# Patient Record
Sex: Male | Born: 1945 | Race: White | Hispanic: No | Marital: Married | Smoking: Never smoker
Health system: Southern US, Community
[De-identification: ages and names within clinical notes are randomized; demographics above are authoritative.]

## PROBLEM LIST (undated history)

## (undated) DIAGNOSIS — I10 Essential (primary) hypertension: Secondary | ICD-10-CM

## (undated) DIAGNOSIS — I25810 Atherosclerosis of coronary artery bypass graft(s) without angina pectoris: Principal | ICD-10-CM

## (undated) DIAGNOSIS — Z87442 Personal history of urinary calculi: Secondary | ICD-10-CM

## (undated) DIAGNOSIS — I739 Peripheral vascular disease, unspecified: Secondary | ICD-10-CM

## (undated) DIAGNOSIS — M1990 Unspecified osteoarthritis, unspecified site: Secondary | ICD-10-CM

## (undated) DIAGNOSIS — E785 Hyperlipidemia, unspecified: Secondary | ICD-10-CM

## (undated) DIAGNOSIS — I214 Non-ST elevation (NSTEMI) myocardial infarction: Secondary | ICD-10-CM

## (undated) DIAGNOSIS — J42 Unspecified chronic bronchitis: Secondary | ICD-10-CM

## (undated) DIAGNOSIS — I472 Ventricular tachycardia: Secondary | ICD-10-CM

## (undated) HISTORY — PX: FRACTURE SURGERY: SHX138

## (undated) HISTORY — DX: Atherosclerosis of coronary artery bypass graft(s) without angina pectoris: I25.810

## (undated) HISTORY — DX: Peripheral vascular disease, unspecified: I73.9

---

## 1971-03-15 HISTORY — PX: MANDIBLE SURGERY: SHX707

## 2001-12-06 ENCOUNTER — Emergency Department (HOSPITAL_COMMUNITY): Admission: EM | Admit: 2001-12-06 | Discharge: 2001-12-07 | Payer: Self-pay | Admitting: Emergency Medicine

## 2001-12-07 ENCOUNTER — Encounter: Payer: Self-pay | Admitting: Emergency Medicine

## 2001-12-10 ENCOUNTER — Encounter: Payer: Self-pay | Admitting: Emergency Medicine

## 2001-12-10 ENCOUNTER — Emergency Department (HOSPITAL_COMMUNITY): Admission: EM | Admit: 2001-12-10 | Discharge: 2001-12-10 | Payer: Self-pay | Admitting: Emergency Medicine

## 2005-11-15 ENCOUNTER — Emergency Department (HOSPITAL_COMMUNITY): Admission: EM | Admit: 2005-11-15 | Discharge: 2005-11-15 | Payer: Self-pay | Admitting: Emergency Medicine

## 2007-05-31 ENCOUNTER — Emergency Department (HOSPITAL_COMMUNITY): Admission: EM | Admit: 2007-05-31 | Discharge: 2007-05-31 | Payer: Self-pay | Admitting: Emergency Medicine

## 2007-06-16 ENCOUNTER — Emergency Department (HOSPITAL_COMMUNITY): Admission: EM | Admit: 2007-06-16 | Discharge: 2007-06-16 | Payer: Self-pay | Admitting: Emergency Medicine

## 2010-12-06 LAB — STREP A DNA PROBE: Group A Strep Probe: NEGATIVE

## 2011-02-15 ENCOUNTER — Telehealth: Payer: Self-pay

## 2011-02-15 ENCOUNTER — Other Ambulatory Visit: Payer: Self-pay

## 2011-02-15 DIAGNOSIS — Z139 Encounter for screening, unspecified: Secondary | ICD-10-CM

## 2011-02-15 NOTE — Telephone Encounter (Addendum)
Gastroenterology Pre-Procedure Form  Request Date: 02/15/2011       Requesting Physician: Dr. Phillips Odor     PATIENT INFORMATION:  Albert Carter is a 65 y.o., male (DOB=07-20-1945).  PROCEDURE: Procedure(s) requested: colonoscopy Procedure Reason: screening for colon cancer  PATIENT REVIEW QUESTIONS: The patient reports the following:   1. Diabetes Melitis: no 2. Joint replacements in the past 12 months: no 3. Major health problems in the past 3 months: no 4. Has an artificial valve or MVP:no 5. Has been advised in past to take antibiotics in advance of a procedure like teeth cleaning: no    MEDICATIONS & ALLERGIES:    Patient reports the following regarding taking any blood thinners:   Plavix? no Aspirin?no Coumadin?  no  Patient confirms/reports the following medications:  No current outpatient prescriptions on file.    Patient confirms/reports the following allergies:  Allergies  Allergen Reactions  . Codeine Other (See Comments)    MAKES HIM SLEEP ALL TIME    Patient is appropriate to schedule for requested procedure(s): yes  AUTHORIZATION INFORMATION Primary Insurance:   ID #:  Group #:  Pre-Cert / Auth required: Pre-Cert / Auth #:  Secondary Insurance:  ID #: Group #:  Pre-Cert / Auth required:  Pre-Cert / Auth #:   No orders of the defined types were placed in this encounter.    SCHEDULE INFORMATION: Procedure has been scheduled as follows:  Date: 03/17/2011      Time: 11:30 AM  Location: [facility] Short Stay  This Gastroenterology Pre-Precedure Form is being routed to the following provider(s) for review: R. Roetta Sessions, MD  Rx and instructions mailed to pt along with a change in the time ( Changed from 11:30 to 8:30 )

## 2011-02-15 NOTE — Telephone Encounter (Signed)
Okay for screening colonoscopy.

## 2011-02-21 ENCOUNTER — Telehealth: Payer: Self-pay | Admitting: Internal Medicine

## 2011-02-21 NOTE — Telephone Encounter (Signed)
A lady called for pt wanting to rsc his colonoscopy to either the 6,7 or 8. Please return their call to [phone]

## 2011-02-24 ENCOUNTER — Telehealth: Payer: Self-pay | Admitting: Gastroenterology

## 2011-02-24 ENCOUNTER — Encounter: Payer: Self-pay | Admitting: Gastroenterology

## 2011-02-24 NOTE — Telephone Encounter (Signed)
pts wife called asking to move Albert Carter procedure to the week of Jan 7- TCS moved to 1/10 - new instructions mailed

## 2011-02-28 NOTE — Telephone Encounter (Signed)
Crystal rescheduled for the 10th. And mailed him new instructions.

## 2011-03-18 ENCOUNTER — Encounter (HOSPITAL_COMMUNITY): Payer: Self-pay | Admitting: Pharmacy Technician

## 2011-03-22 ENCOUNTER — Telehealth: Payer: Self-pay

## 2011-03-22 NOTE — Telephone Encounter (Signed)
Called pt to update triage. He has had no new problems and is still on no meds. He did say he has read the instructions wrong and had started on the prep today and has drank about a quart. I told him he was supposed to do that tomorrow. I asked him to stop the prep now. I spoke with Gerrit Halls, NP and she said he should be OK if he continues clear liquids today and tomorrow and does the remainder of the prep tomorrow. He may take two more Ducolax at noon tomorrow and then the two after the prep. I have tried to call the pt back and the line is busy.

## 2011-03-23 ENCOUNTER — Telehealth: Payer: Self-pay | Admitting: Gastroenterology

## 2011-03-23 MED ORDER — SODIUM CHLORIDE 0.45 % IV SOLN
Freq: Once | INTRAVENOUS | Status: AC
  Administered 2011-03-24: 12:00:00 via INTRAVENOUS

## 2011-03-23 NOTE — Telephone Encounter (Signed)
LATE ENTRY;   Called back yesterday afternoon and spoke with Mrs. Moodie and informed her of the info.

## 2011-03-23 NOTE — Telephone Encounter (Signed)
Pt aware of arrival and procedure time being moved to 12:10& 1:10

## 2011-03-24 ENCOUNTER — Encounter (HOSPITAL_COMMUNITY)
Admission: RE | Disposition: A | Discharge status: Home / Self Care | Payer: Self-pay | Source: Ambulatory Visit | Attending: Internal Medicine

## 2011-03-24 ENCOUNTER — Other Ambulatory Visit: Payer: Self-pay | Admitting: Internal Medicine

## 2011-03-24 ENCOUNTER — Ambulatory Visit (HOSPITAL_COMMUNITY)
Admission: RE | Admit: 2011-03-24 | Discharge: 2011-03-24 | Disposition: A | Discharge status: Home / Self Care | Payer: Medicare Other | Source: Ambulatory Visit | Attending: Internal Medicine | Admitting: Internal Medicine

## 2011-03-24 ENCOUNTER — Encounter (HOSPITAL_COMMUNITY): Payer: Self-pay

## 2011-03-24 DIAGNOSIS — Z1211 Encounter for screening for malignant neoplasm of colon: Secondary | ICD-10-CM

## 2011-03-24 DIAGNOSIS — K5730 Diverticulosis of large intestine without perforation or abscess without bleeding: Secondary | ICD-10-CM

## 2011-03-24 DIAGNOSIS — D126 Benign neoplasm of colon, unspecified: Secondary | ICD-10-CM

## 2011-03-24 DIAGNOSIS — Z139 Encounter for screening, unspecified: Secondary | ICD-10-CM

## 2011-03-24 HISTORY — PX: COLONOSCOPY: SHX5424

## 2011-03-24 SURGERY — COLONOSCOPY
Anesthesia: Moderate Sedation

## 2011-03-24 MED ORDER — STERILE WATER FOR IRRIGATION IR SOLN
Status: DC | PRN
  Administered 2011-03-24: 14:00:00

## 2011-03-24 MED ORDER — MIDAZOLAM HCL 5 MG/5ML IJ SOLN
INTRAMUSCULAR | Status: AC
  Filled 2011-03-24: qty 10

## 2011-03-24 MED ORDER — MEPERIDINE HCL 100 MG/ML IJ SOLN
INTRAMUSCULAR | Status: DC | PRN
  Administered 2011-03-24 (×2): 25 mg via INTRAVENOUS
  Administered 2011-03-24: 50 mg via INTRAVENOUS

## 2011-03-24 MED ORDER — MEPERIDINE HCL 100 MG/ML IJ SOLN
INTRAMUSCULAR | Status: AC
  Filled 2011-03-24: qty 2

## 2011-03-24 MED ORDER — MIDAZOLAM HCL 5 MG/5ML IJ SOLN
INTRAMUSCULAR | Status: DC | PRN
  Administered 2011-03-24 (×3): 2 mg via INTRAVENOUS
  Administered 2011-03-24: 1 mg via INTRAVENOUS

## 2011-03-24 NOTE — Op Note (Signed)
[facility] [address] [place], [place]  [address]  COLONOSCOPY PROCEDURE REPORT  PATIENT:  Albert, Carter  MR#:  [id number] BIRTHDATE:  [birth year], 65 yrs. old  GENDER:  male ENDOSCOPIST:  R. Roetta Sessions, MD FACP [facility] REF. BY:          Dr. Assunta Found PROCEDURE DATE:  03/24/2011 PROCEDURE:  colonoscopy with polyp ablation and snare polypectomy  INDICATIONS:  first ever average risk screening colonoscopy  INFORMED CONSENT:  The risks, benefits, alternatives and imponderables including but not limited to bleeding, perforation as well as the possibility of a missed lesion have been reviewed. The potential for biopsy, lesion removal, etc. have also been discussed.  Questions have been answered.  All parties agreeable. Please see the history and physical in the medical record for more information.  MEDICATIONS:  Versed 7 mg IV and Demerol 100 mg IV in divided doses  DESCRIPTION OF PROCEDURE:  After a digital rectal exam was performed, the EC-[id number] ([id number]) colonoscope was advanced from the anus through the rectum and colon to the area of the cecum, ileocecal valve and appendiceal orifice.  The cecum was deeply intubated.  These structures were well-seen and photographed for the record.  From the level of the cecum and ileocecal valve, the scope was slowly and cautiously withdrawn.  The mucosal surfaces were carefully surveyed utilizing scope tip deflection to facilitate fold flattening as needed.  The scope was pulled down into the rectum where a thorough examination including retroflexion was performed. <<PROCEDUREIMAGES>>  FINDINGS:  suboptimal prep particularly on the right side but doable. Scattered pancolonic diverticulosis.sigmoid and transverse colon polyps-the largest being in the sigmoid approximately 7 mm in dimensions.  THERAPEUTIC / DIAGNOSTIC MANEUVERS PERFORMED:  the larger polyps in the transverse and sigmoid segments were hot snare  removed and the adjacent sigmoid polyp which is diminutive in size was ablated with                            snare cautery  COMPLICATIONS:  none  CECAL WITHDRAWAL TIME:11 minutes  IMPRESSION:   Colonic polyps-removed as described above. Pancolonic diverticulosis.  RECOMMENDATIONS:  Follow up on pathology report  ______________________________ R. Roetta Sessions, MD Caleen Essex  CC:  Assunta Found, MD  n. eSIGNED:   R. Roetta Sessions at 03/24/2011 02:14 PM  Augustina Mood, [phone]

## 2011-03-24 NOTE — H&P (Signed)
  Primary Care Physician:  No primary provider on file. Primary Gastroenterologist:  Dr. Jena Gauss  Pre-Procedure History & Physical: HPI:  Albert Carter is a 66 y.o. male is here for a screening colonoscopy. First ever average risk screening examination. No family history of polyps or colon cancer per patient. No GI symptoms.  Past Medical History  Diagnosis Date  . Bronchitis   . Asthma     Past Surgical History  Procedure Date  . Facial fracture surgery 1973    [facility]  . 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 Codeine Other (See Comments) 02/15/2011    History reviewed. No pertinent family history.  History   Social History  . Marital Status: Divorced    Spouse Name: N/A    Number of Children: N/A  . Years of Education: N/A   Occupational History  . Not on file.   Social History Main Topics  . Smoking status: Not on file  . Smokeless tobacco: Not on file  . Alcohol Use: No  . Drug Use: No  . Sexually Active:    Other Topics Concern  . Not on file   Social History Narrative  . No narrative on file    Review of Systems: See HPI, otherwise negative ROS  Physical Exam: BP 169/93  Pulse 63  Temp(Src) 97.7 F (36.5 C) (Oral)  Resp 22  Ht 5\' 8"  (1.727 m)  Wt 280 lb (127.007 kg)  BMI 42.57 kg/m2  SpO2 99% General:   Alert,  Well-developed, well-nourished, pleasant and cooperative in NAD Head:  Normocephalic and atraumatic. Eyes:  Sclera clear, no icterus.   Conjunctiva pink. Ears:  Normal auditory acuity. Nose:  No deformity, discharge,  or lesions. Mouth:  No deformity or lesions, dentition normal. Neck:  Supple; no masses or thyromegaly. Lungs:  Clear throughout to auscultation.   No wheezes, crackles, or rhonchi. No acute distress. Heart:  Regular rate and rhythm; no murmurs, clicks, rubs,  or  gallops. Abdomen:  Soft, nontender and nondistended. No masses, hepatosplenomegaly or hernias noted. Normal bowel sounds, without guarding, and without rebound.   Msk:  Symmetrical without gross deformities. Normal posture. Pulses:  Normal pulses noted. Extremities:  Without clubbing or edema. Neurologic:  Alert and  oriented x4;  grossly normal neurologically. Skin:  Intact without significant lesions or rashes. Cervical Nodes:  No significant cervical adenopathy. Psych:  Alert and cooperative. Normal mood and affect.  Impression/Plan: Albert Carter is now here to undergo a screening colonoscopy.  Risks, benefits, limitations, imponderables and alternatives regarding colonoscopy have been reviewed with the patient. Questions have been answered. All parties agreeable.

## 2011-03-28 ENCOUNTER — Encounter (HOSPITAL_COMMUNITY): Payer: Self-pay | Admitting: Internal Medicine

## 2011-03-29 ENCOUNTER — Encounter: Payer: Self-pay | Admitting: Internal Medicine

## 2012-07-08 ENCOUNTER — Encounter (HOSPITAL_COMMUNITY): Payer: Self-pay | Admitting: *Deleted

## 2012-07-08 ENCOUNTER — Emergency Department (HOSPITAL_COMMUNITY)
Admission: EM | Admit: 2012-07-08 | Discharge: 2012-07-08 | Disposition: A | Discharge status: Home / Self Care | Payer: Medicare Other | Attending: Emergency Medicine | Admitting: Emergency Medicine

## 2012-07-08 ENCOUNTER — Emergency Department (HOSPITAL_COMMUNITY): Payer: Medicare Other

## 2012-07-08 DIAGNOSIS — T148XXA Other injury of unspecified body region, initial encounter: Secondary | ICD-10-CM

## 2012-07-08 DIAGNOSIS — S52609A Unspecified fracture of lower end of unspecified ulna, initial encounter for closed fracture: Secondary | ICD-10-CM | POA: Insufficient documentation

## 2012-07-08 DIAGNOSIS — W3189XA Contact with other specified machinery, initial encounter: Secondary | ICD-10-CM | POA: Insufficient documentation

## 2012-07-08 DIAGNOSIS — Y9389 Activity, other specified: Secondary | ICD-10-CM | POA: Insufficient documentation

## 2012-07-08 DIAGNOSIS — Y929 Unspecified place or not applicable: Secondary | ICD-10-CM | POA: Insufficient documentation

## 2012-07-08 DIAGNOSIS — S6990XA Unspecified injury of unspecified wrist, hand and finger(s), initial encounter: Secondary | ICD-10-CM | POA: Insufficient documentation

## 2012-07-08 DIAGNOSIS — S59909A Unspecified injury of unspecified elbow, initial encounter: Secondary | ICD-10-CM | POA: Insufficient documentation

## 2012-07-08 DIAGNOSIS — M25531 Pain in right wrist: Secondary | ICD-10-CM

## 2012-07-08 DIAGNOSIS — J45909 Unspecified asthma, uncomplicated: Secondary | ICD-10-CM | POA: Insufficient documentation

## 2012-07-08 DIAGNOSIS — S59919A Unspecified injury of unspecified forearm, initial encounter: Secondary | ICD-10-CM | POA: Insufficient documentation

## 2012-07-08 MED ORDER — OXYCODONE-ACETAMINOPHEN 5-325 MG PO TABS: 1.0000 | ORAL_TABLET | ORAL | Status: DC | PRN

## 2012-07-08 MED ORDER — NAPROXEN 500 MG PO TABS: 500.0000 mg | ORAL_TABLET | Freq: Two times a day (BID) | ORAL | Status: DC

## 2012-07-08 NOTE — ED Notes (Signed)
Pt c/o right wrist pain that started Friday, denies any injury, cms intact distal

## 2012-07-09 MED FILL — Oxycodone w/ Acetaminophen Tab 5-325 MG: ORAL | Qty: 6 | Status: AC

## 2012-07-10 NOTE — ED Provider Notes (Signed)
History     CSN: [id number]  Arrival date & time 07/08/12  1428   First MD Initiated Contact with Patient 07/08/12 1455      Chief Complaint  Patient presents with  . Wrist Pain    (Consider location/radiation/quality/duration/timing/severity/associated sxs/prior treatment) HPI Comments: Patient c/o pain to his right wrist after pulling to crank a lawnmower.  Reports swelling to his wrist and pain with flexion.  He denies pain to his fingers or upper forearm.  He also denies numbness or weakness of the joint.    Patient is a 67 y.o. male presenting with wrist pain. The history is provided by the patient.  Wrist Pain This is a new problem. The current episode started in the past 7 days. The problem occurs constantly. The problem has been unchanged. Associated symptoms include arthralgias and joint swelling. Pertinent negatives include no chills, fever, headaches, myalgias, nausea, neck pain, numbness, vomiting or weakness. The symptoms are aggravated by bending and twisting. He has tried nothing for the symptoms. The treatment provided no relief.    Past Medical History  Diagnosis Date  . Bronchitis   . Asthma     Past Surgical History  Procedure Laterality Date  . Facial fracture surgery  1973    m[facility]  . 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 Colonoscopy  03/24/2011    Procedure: COLONOSCOPY;  Surgeon: Corbin Ade, MD;  Location: [facility];  Service: Endoscopy;  Laterality: N/A;  11:30 AM    No family history on file.  History  Substance Use Topics  . Smoking status: Not on file  . Smokeless tobacco: Not on file  . Alcohol Use: No      Review of Systems  Constitutional: Negative for fever and chills.  HENT: Negative for neck pain.   Gastrointestinal: Negative for nausea and vomiting.  Genitourinary: Negative for dysuria and difficulty urinating.  Musculoskeletal: Positive for joint swelling and arthralgias. Negative for myalgias.  Skin: Negative  for color change and wound.  Neurological: Negative for weakness, numbness and headaches.  All other systems reviewed and are negative.    Allergies  Codeine  Home Medications   Current Outpatient Rx  Name  Route  Sig  Dispense  Refill  . naproxen (NAPROSYN) 500 MG tablet   Oral   Take 1 tablet (500 mg total) by mouth 2 (two) times daily with a meal.   20 tablet   0   . oxyCODONE-acetaminophen (PERCOCET/ROXICET) 5-325 MG per tablet   Oral   Take 1 tablet by mouth every 4 (four) hours as needed for pain.   6 tablet   0   . oxyCODONE-acetaminophen (PERCOCET/ROXICET) 5-325 MG per tablet   Oral   Take 1 tablet by mouth every 4 (four) hours as needed for pain.   15 tablet   0     BP 154/77  Pulse 73  Temp(Src) 97.6 F (36.4 C) (Oral)  Resp 16  Ht 5\' 8"  (1.727 m)  Wt 273 lb (123.832 kg)  BMI 41.52 kg/m2  SpO2 100%  Physical Exam  Nursing note and vitals reviewed. Constitutional: He is oriented to person, place, and time. He appears well-developed and well-nourished. No distress.  HENT:  Head: Normocephalic and atraumatic.  Cardiovascular: Normal rate, regular rhythm and normal heart sounds.   Pulmonary/Chest: Effort normal and breath sounds normal.  Musculoskeletal: He exhibits edema and tenderness.  Diffuse ttp of the right wrist.  Mild to moderate  STS.  Radial pulse is brisk,  Distal sensation intact.  CR< 2 sec.  No bruising, erythema , excessive warmth or bony deformity.  Pain reproduced with dorsiflexion or palmar flexion of the right wrist.    Neurological: He is alert and oriented to person, place, and time. He exhibits normal muscle tone. Coordination normal.  Skin: Skin is warm and dry.    ED Course  Procedures (including critical care time)  Labs Reviewed - No data to display No results found.  Dg Wrist Complete Right  07/08/2012  *RADIOLOGY REPORT*  Clinical Data: Wrist pain and swelling.  RIGHT WRIST - COMPLETE 3+ VIEW  Comparison: No priors.   Findings: Lateral view demonstrates significant soft tissue swelling anterior to the distal radius and ulna.  There is mild irregularity of the ulnar styloid, and a small ossific fragment adjacent to this noted on the oblique projection, compatible with an ulnar styloid avulsion fracture.  The no other definite acute displaced fracture, subluxation or dislocation is noted.  IMPRESSION: 1.  Ulnar styloid avulsion fracture with extensive soft tissue thickening anterior to the wrist joint.   Original Report Authenticated By: Trudie Reed, M.D.      1. Avulsion fracture   2. Wrist pain, acute, right    velcro splint applied, pain improved, remains NV intact   MDM     Patient advised of x-ray finding.  He agrees to elevate, ice and close f/u with Dr. Romeo Apple.  Will prescribe percocet for pain and naprosyn.  Pain improved after splinting.     The patient appears reasonably screened and/or stabilized for discharge and I doubt any other medical condition or other [facility] requiring further screening, evaluation, or treatment in the ED at this time prior to discharge.     Gerarda Conklin L. Trisha Mangle, PA-C 07/10/12 2207

## 2012-07-11 NOTE — ED Provider Notes (Signed)
Medical screening examination/treatment/procedure(s) were performed by non-physician practitioner and as supervising physician I was immediately available for consultation/collaboration.   Conya Ellinwood W. Roemello Speyer, MD 07/11/12 1550 

## 2013-07-02 ENCOUNTER — Encounter (HOSPITAL_COMMUNITY): Payer: Self-pay | Admitting: Emergency Medicine

## 2013-07-02 ENCOUNTER — Emergency Department (HOSPITAL_COMMUNITY)
Admission: EM | Admit: 2013-07-02 | Discharge: 2013-07-03 | Disposition: A | Discharge status: Home / Self Care | Payer: Medicare HMO | Attending: Emergency Medicine | Admitting: Emergency Medicine

## 2013-07-02 DIAGNOSIS — M5430 Sciatica, unspecified side: Secondary | ICD-10-CM | POA: Insufficient documentation

## 2013-07-02 DIAGNOSIS — J45909 Unspecified asthma, uncomplicated: Secondary | ICD-10-CM | POA: Insufficient documentation

## 2013-07-02 DIAGNOSIS — R109 Unspecified abdominal pain: Secondary | ICD-10-CM | POA: Insufficient documentation

## 2013-07-02 DIAGNOSIS — Z87448 Personal history of other diseases of urinary system: Secondary | ICD-10-CM | POA: Insufficient documentation

## 2013-07-02 NOTE — ED Notes (Signed)
Patient complaining of left-sided back pain x 7 days that is worsening. Reports pain radiates down left leg when walking.

## 2013-07-03 LAB — CBC WITH DIFFERENTIAL/PLATELET
BASOS PCT: 0 % (ref 0–1)
Basophils Absolute: 0 10*3/uL (ref 0.0–0.1)
Eosinophils Absolute: 0.2 10*3/uL (ref 0.0–0.7)
Eosinophils Relative: 2 % (ref 0–5)
HCT: 45.7 % (ref 39.0–52.0)
HEMOGLOBIN: 15.4 g/dL (ref 13.0–17.0)
Lymphocytes Relative: 22 % (ref 12–46)
Lymphs Abs: 2.3 10*3/uL (ref 0.7–4.0)
MCH: 31.6 pg (ref 26.0–34.0)
MCHC: 33.7 g/dL (ref 30.0–36.0)
MCV: 93.6 fL (ref 78.0–100.0)
MONOS PCT: 8 % (ref 3–12)
Monocytes Absolute: 0.8 10*3/uL (ref 0.1–1.0)
Neutro Abs: 7.1 10*3/uL (ref 1.7–7.7)
Neutrophils Relative %: 68 % (ref 43–77)
PLATELETS: 225 10*3/uL (ref 150–400)
RBC: 4.88 MIL/uL (ref 4.22–5.81)
RDW: 14.7 % (ref 11.5–15.5)
WBC: 10.3 10*3/uL (ref 4.0–10.5)

## 2013-07-03 LAB — BASIC METABOLIC PANEL
BUN: 24 mg/dL — AB (ref 6–23)
CO2: 23 mEq/L (ref 19–32)
Calcium: 9.5 mg/dL (ref 8.4–10.5)
Chloride: 99 mEq/L (ref 96–112)
Creatinine, Ser: 0.99 mg/dL (ref 0.50–1.35)
GFR calc Af Amer: >90 mL/min (ref >90)
GFR calc non Af Amer: 82 mL/min — ABNORMAL LOW (ref >90)
Glucose, Bld: 152 mg/dL — ABNORMAL HIGH (ref 70–99)
POTASSIUM: 4 meq/L (ref 3.7–5.3)
Sodium: 135 mEq/L — ABNORMAL LOW (ref 137–147)

## 2013-07-03 LAB — URINALYSIS, ROUTINE W REFLEX MICROSCOPIC
Bilirubin Urine: NEGATIVE
Glucose, UA: NEGATIVE mg/dL
Hgb urine dipstick: NEGATIVE
KETONES UR: NEGATIVE mg/dL
Leukocytes, UA: NEGATIVE
NITRITE: NEGATIVE
Protein, ur: NEGATIVE mg/dL
Specific Gravity, Urine: 1.025 (ref 1.005–1.030)
UROBILINOGEN UA: 1 mg/dL (ref 0.0–1.0)
pH: 6 (ref 5.0–8.0)

## 2013-07-03 MED ORDER — ONDANSETRON 8 MG PO TBDP
8.0000 mg | ORAL_TABLET | Freq: Once | ORAL | Status: AC
  Administered 2013-07-03: 8 mg via ORAL
  Filled 2013-07-03: qty 1

## 2013-07-03 MED ORDER — NAPROXEN 500 MG PO TABS: 500.0000 mg | ORAL_TABLET | Freq: Two times a day (BID) | ORAL | Status: DC

## 2013-07-03 MED ORDER — OXYCODONE-ACETAMINOPHEN 5-325 MG PO TABS: 1.0000 | ORAL_TABLET | ORAL | Status: DC | PRN

## 2013-07-03 MED ORDER — OXYCODONE-ACETAMINOPHEN 5-325 MG PO TABS
1.0000 | ORAL_TABLET | Freq: Once | ORAL | Status: AC
  Administered 2013-07-03: 1 via ORAL
  Filled 2013-07-03: qty 1

## 2013-07-03 NOTE — ED Provider Notes (Signed)
CSN: [id number]     Arrival date & time 07/02/13  2208 History   First MD Initiated Contact with Patient 07/02/13 2334     Chief Complaint  Patient presents with  . Back Pain     (Consider location/radiation/quality/duration/timing/severity/associated sxs/prior Treatment) HPI Comments: Albert Carter is a 68 y.o. male who presents to the Emergency Department complaining of gradual onset of left flank and low back pain that's been worsening for one week.  He reports hx of same in the past that was related to a "kidney infection".  He states the pain begins in the flank and radiates to the lower back , buttocks and down the back side of his left leg to the level of his knee. He also describes intermittent "stinging" to his lower back.  Pain is worse with movement. He does report excessive bending and using a garden tiller recently.   He denies incontinence/retention of urine or feces, numbness or weakness of the LE's, abdominal pain, saddle anesthesia's, dysuria, or hematuria.  He has not taken anything for pain today.    No hx of kidney stones  The history is provided by the patient.    Past Medical History  Diagnosis Date  . Bronchitis   . Asthma    Past Surgical History  Procedure Laterality Date  . Facial fracture surgery  1973    [facility]  . 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 Colonoscopy  03/24/2011    Procedure: COLONOSCOPY;  Surgeon: Daneil Dolin, MD;  Location: [facility];  Service: Endoscopy;  Laterality: N/A;  11:30 AM   History reviewed. No pertinent family history. History  Substance Use Topics  . Smoking status: Never Smoker   . Smokeless tobacco: Not on file  . Alcohol Use: No    Review of Systems  Constitutional: Negative for fever.  Respiratory: Negative for shortness of breath.   Gastrointestinal: Negative for vomiting, abdominal pain and constipation.  Genitourinary: Negative for dysuria, hematuria, flank pain, decreased urine volume and  difficulty urinating.       No perineal numbness or incontinence of urine or feces  Musculoskeletal: Positive for back pain. Negative for joint swelling.  Skin: Negative for rash.  Neurological: Negative for weakness and numbness.  All other systems reviewed and are negative.     Allergies  Codeine  Home Medications   Prior to Admission medications   Medication Sig Start Date End Date Taking? Authorizing Provider  naproxen (NAPROSYN) 500 MG tablet Take 1 tablet (500 mg total) by mouth 2 (two) times daily with a meal. 07/08/12   Cheryle Dark L. Geraldine Tesar, PA-C  oxyCODONE-acetaminophen (PERCOCET/ROXICET) 5-325 MG per tablet Take 1 tablet by mouth every 4 (four) hours as needed for pain. 07/08/12   Virda Betters L. Sela Falk, PA-C  oxyCODONE-acetaminophen (PERCOCET/ROXICET) 5-325 MG per tablet Take 1 tablet by mouth every 4 (four) hours as needed for pain. 07/08/12   Devlin Mcveigh L. Tyrene Nader, PA-C   BP 149/75  Pulse 72  Temp(Src) 98.2 F (36.8 C) (Oral)  Resp 18  Ht 5\' 8"  (1.727 m)  Wt 275 lb (124.739 kg)  BMI 41.82 kg/m2  SpO2 98% Physical Exam  Nursing note and vitals reviewed. Constitutional: He is oriented to person, place, and time. He appears well-developed and well-nourished. No distress.  HENT:  Head: Normocephalic and atraumatic.  Neck: Normal range of motion. Neck supple.  Cardiovascular: Normal rate, regular rhythm, normal heart sounds and intact distal pulses.   No  murmur heard. Pulmonary/Chest: Effort normal and breath sounds normal. No respiratory distress.  Abdominal: Soft. He exhibits no distension and no mass. There is no tenderness. There is no rebound and no guarding.  Musculoskeletal: He exhibits tenderness. He exhibits no edema.       Lumbar back: He exhibits tenderness and pain. He exhibits normal range of motion, no swelling, no deformity, no laceration and normal pulse.  ttp of the left flank and lumbar paraspinal muscles.  No spinal tenderness.  DP pulses are brisk and  symmetrical.  Distal sensation intact.  Pain to the back is reproduced with SLR on the left.  Pt has normal strength against resistance of bilateral lower extremities.     Neurological: He is alert and oriented to person, place, and time. He has normal strength. No sensory deficit. He exhibits normal muscle tone. Coordination and gait normal.  Reflex Scores:      Patellar reflexes are 2+ on the right side and 2+ on the left side.      Achilles reflexes are 2+ on the right side and 2+ on the left side. Skin: Skin is warm and dry. No rash noted.    ED Course  Procedures (including critical care time) Labs Review Labs Reviewed  BASIC METABOLIC PANEL - Abnormal; Notable for the following:    Sodium 135 (*)    Glucose, Bld 152 (*)    BUN 24 (*)    GFR calc non Af Amer 82 (*)    All other components within normal limits  URINALYSIS, ROUTINE W REFLEX MICROSCOPIC  CBC WITH DIFFERENTIAL    Imaging Review No results found.   EKG Interpretation None      MDM   Final diagnoses:  Sciatica    VSS.  Pt is non-toxic appearing, watching TV.  Pain to the left flank and lower back that radiates to the left knee.  Seems c/w lumbar radicular pain, but patient reports similar sx's previously with "kidney infection" so labs and U/A was ordered.  No hx of trauma to indicate need for imaging at this time.     0130  Patient is feeling better, pain improved.  Discussed labs results with patient.    No concerning sx's for UTI or emergent neurological or infectious process.  Pt ambulates with a steady gait.  Likely sciatica.  Pt agrees to symptomatic treatment with percocet, ibuprofen and close f/u with his PMD.    Abdishakur Gottschall L. Lindie Roberson, PA-C 07/03/13 1300

## 2013-07-03 NOTE — Discharge Instructions (Signed)
Sciatica °Sciatica is pain, weakness, numbness, or tingling along your sciatic nerve. The nerve starts in the lower back and runs down the back of each leg. Nerve damage or certain conditions pinch or put pressure on the sciatic nerve. This causes the pain, weakness, and other discomforts of sciatica. °HOME CARE  °· Only take medicine as told by your doctor. °· Apply ice to the affected area for 20 minutes. Do this 3 4 times a day for the first 48 72 hours. Then try heat in the same way. °· Exercise, stretch, or do your usual activities if these do not make your pain worse. °· Go to physical therapy as told by your doctor. °· Keep all doctor visits as told. °· Do not wear high heels or shoes that are not supportive. °· Get a firm mattress if your mattress is too soft to lessen pain and discomfort. °GET HELP RIGHT AWAY IF:  °· You cannot control when you poop (bowel movement) or pee (urinate). °· You have more weakness in your lower back, lower belly (pelvis), butt (buttocks), or legs. °· You have redness or puffiness (swelling) of your back. °· You have a burning feeling when you pee. °· You have pain that gets worse when you lie down. °· You have pain that wakes you from your sleep. °· Your pain is worse than past pain. °· Your pain lasts longer than 4 weeks. °· You are suddenly losing weight without reason. °MAKE SURE YOU:  °· Understand these instructions. °· Will watch this condition. °· Will get help right away if you are not doing well or get worse. °Document Released: 12/08/2007 Document Revised: 08/30/2011 Document Reviewed: 07/10/2011 °[employer]® Patient Information ©2014 [employer]. ° °

## 2013-07-06 NOTE — ED Provider Notes (Signed)
Medical screening examination/treatment/procedure(s) were performed by non-physician practitioner and as supervising physician I was immediately available for consultation/collaboration.     Veryl Speak, MD 07/06/13 [phone]

## 2014-03-12 ENCOUNTER — Encounter: Payer: Self-pay | Admitting: Internal Medicine

## 2014-03-19 ENCOUNTER — Telehealth: Payer: Self-pay | Admitting: Internal Medicine

## 2014-03-19 NOTE — Telephone Encounter (Signed)
PATIENT SAID THAT HE RECEIVED THE LETTER AND DOES NOT WANT TO SCHEDULE A COLONOSCOPY.

## 2014-03-19 NOTE — Telephone Encounter (Signed)
Routing to RMR for [employer]

## 2014-03-19 NOTE — Telephone Encounter (Signed)
That would be his decision. Precancerous polyps removed previously in a setting of a suboptimal preparation. I recommend colonoscopy now. If he does not want that is fine. Worse case scenario, this could negatively impact his health later

## 2014-03-20 NOTE — Telephone Encounter (Signed)
Tried to call pt- NA 

## 2014-03-21 NOTE — Telephone Encounter (Signed)
Tried to call pt- LM with male to return call.

## 2014-03-26 ENCOUNTER — Encounter: Payer: Self-pay | Admitting: General Practice

## 2014-03-26 NOTE — Telephone Encounter (Signed)
Letter mailed to pt.  

## 2014-06-17 ENCOUNTER — Encounter (HOSPITAL_COMMUNITY): Payer: Self-pay | Admitting: Emergency Medicine

## 2014-06-17 ENCOUNTER — Emergency Department (HOSPITAL_COMMUNITY): Payer: Commercial Managed Care - HMO

## 2014-06-17 ENCOUNTER — Emergency Department (HOSPITAL_COMMUNITY)
Admission: EM | Admit: 2014-06-17 | Discharge: 2014-06-17 | Disposition: A | Discharge status: Home / Self Care | Payer: Commercial Managed Care - HMO | Attending: Emergency Medicine | Admitting: Emergency Medicine

## 2014-06-17 DIAGNOSIS — Y9389 Activity, other specified: Secondary | ICD-10-CM | POA: Insufficient documentation

## 2014-06-17 DIAGNOSIS — J45909 Unspecified asthma, uncomplicated: Secondary | ICD-10-CM | POA: Diagnosis not present

## 2014-06-17 DIAGNOSIS — S20212A Contusion of left front wall of thorax, initial encounter: Secondary | ICD-10-CM | POA: Diagnosis not present

## 2014-06-17 DIAGNOSIS — Y998 Other external cause status: Secondary | ICD-10-CM | POA: Diagnosis not present

## 2014-06-17 DIAGNOSIS — W1839XA Other fall on same level, initial encounter: Secondary | ICD-10-CM | POA: Insufficient documentation

## 2014-06-17 DIAGNOSIS — Y9289 Other specified places as the place of occurrence of the external cause: Secondary | ICD-10-CM | POA: Diagnosis not present

## 2014-06-17 DIAGNOSIS — S29001A Unspecified injury of muscle and tendon of front wall of thorax, initial encounter: Secondary | ICD-10-CM | POA: Diagnosis present

## 2014-06-17 DIAGNOSIS — R0781 Pleurodynia: Secondary | ICD-10-CM | POA: Diagnosis not present

## 2014-06-17 DIAGNOSIS — S299XXA Unspecified injury of thorax, initial encounter: Secondary | ICD-10-CM | POA: Diagnosis not present

## 2014-06-17 MED ORDER — HYDROCODONE-ACETAMINOPHEN 5-325 MG PO TABS: ORAL_TABLET | ORAL | Status: DC

## 2014-06-17 MED ORDER — OXYCODONE-ACETAMINOPHEN 5-325 MG PO TABS
1.0000 | ORAL_TABLET | Freq: Once | ORAL | Status: DC
  Filled 2014-06-17: qty 1

## 2014-06-17 NOTE — Discharge Instructions (Signed)
Rib Contusion °A rib contusion (bruise) can occur by a blow to the chest or by a fall against a hard object. Usually these will be much better in a couple weeks. If X-rays were taken today and there are no broken bones (fractures), the diagnosis of bruising is made. However, broken ribs may not show up for several days, or may be discovered later on a routine X-ray when signs of healing show up. If this happens to you, it does not mean that something was missed on the X-ray, but simply that it did not show up on the first X-rays. Earlier diagnosis will not usually change the treatment. °HOME CARE INSTRUCTIONS  °· Avoid strenuous activity. Be careful during activities and avoid bumping the injured ribs. Activities that pull on the injured ribs and cause pain should be avoided, if possible. °· For the first day or two, an ice pack used every 20 minutes while awake may be helpful. Put ice in a plastic bag and put a towel between the bag and the skin. °· Eat a normal, well-balanced diet. Drink plenty of fluids to avoid constipation. °· Take deep breaths several times a day to keep lungs free of infection. Try to cough several times a day. Splint the injured area with a pillow while coughing to ease pain. Coughing can help prevent pneumonia. °· Wear a rib belt or binder only if told to do so by your caregiver. If you are wearing a rib belt or binder, you must do the breathing exercises as directed by your caregiver. If not used properly, rib belts or binders restrict breathing which can lead to pneumonia. °· Only take over-the-counter or prescription medicines for pain, discomfort, or fever as directed by your caregiver. °SEEK MEDICAL CARE IF:  °· You or your child has an oral temperature above 102° F (38.9° C). °· Your baby is older than 3 months with a rectal temperature of 100.5° F (38.1° C) or higher for more than 1 day. °· You develop a cough, with thick or bloody sputum. °SEEK IMMEDIATE MEDICAL CARE IF:  °· You  have difficulty breathing. °· You feel sick to your stomach (nausea), have vomiting or belly (abdominal) pain. °· You have worsening pain, not controlled with medications, or there is a change in the location of the pain. °· You develop sweating or radiation of the pain into the arms, jaw or shoulders, or become light headed or faint. °· You or your child has an oral temperature above 102° F (38.9° C), not controlled by medicine. °· Your or your baby is older than 3 months with a rectal temperature of 102° F (38.9° C) or higher. °· Your baby is 3 months old or younger with a rectal temperature of 100.4° F (38° C) or higher. °MAKE SURE YOU:  °· Understand these instructions. °· Will watch your condition. °· Will get help right away if you are not doing well or get worse. °Document Released: 11/23/2000 Document Revised: 06/25/2012 Document Reviewed: 10/17/2007 °ExitCare® Patient Information ©2015 [employer]. This information is not intended to replace advice given to you by your health care provider. Make sure you discuss any questions you have with your health care provider. ° °

## 2014-06-17 NOTE — ED Notes (Signed)
Waiting for RT to provide pt with incentive spirometer and to explain use; pt did not receive percocet due to having to drive home

## 2014-06-17 NOTE — ED Notes (Signed)
Patient states he fell 4 days ago and is complaining of left rib pain. States "I think I may have broke a rib." States he fell from bottom step onto the ground.

## 2014-06-19 NOTE — ED Provider Notes (Signed)
CSN: [id number]     Arrival date & time 06/17/14  1625 History   First MD Initiated Contact with Patient 06/17/14 1737     Chief Complaint  Patient presents with  . Rib Injury     (Consider location/radiation/quality/duration/timing/severity/associated sxs/prior Treatment) HPI   Albert Carter is a 69 y.o. male who presents to the Emergency Department complaining of pain to left anterior chest wall.  He reports a mechanical fall 4 days prior to arrival and landed on his arm.  He complains of sharp pain to his ribs with certain movement, cough and deep breathing.  Pain improves at rest.  He denies head injury, abdominal pain, shortness of breath, bloody sputum, neck or back pain.  He has not taken any medications for symptom relief.     Past Medical History  Diagnosis Date  . Bronchitis   . Asthma    Past Surgical History  Procedure Laterality Date  . Facial fracture surgery  1973    [facility]  . 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 Colonoscopy  03/24/2011    Procedure: COLONOSCOPY;  Surgeon: Daneil Dolin, MD;  Location: [facility];  Service: Endoscopy;  Laterality: N/A;  11:30 AM   History reviewed. No pertinent family history. History  Substance Use Topics  . Smoking status: Never Smoker   . Smokeless tobacco: Not on file  . Alcohol Use: No    Review of Systems  Constitutional: Negative for fever and chills.  Respiratory: Negative for shortness of breath and wheezing.   Cardiovascular: Positive for chest pain (left rib pain).  Gastrointestinal: Negative for nausea, vomiting and abdominal pain.  Genitourinary: Negative for dysuria, hematuria, flank pain and difficulty urinating.  Musculoskeletal: Negative for joint swelling, arthralgias and neck pain.  Skin: Negative for color change and wound.  Neurological: Negative for dizziness, weakness and numbness.  All other systems reviewed and are negative.     Allergies  Codeine  Home Medications   Prior  to Admission medications   Medication Sig Start Date End Date Taking? Authorizing Provider  HYDROcodone-acetaminophen (NORCO/VICODIN) 5-325 MG per tablet Take one-two tabs po q 4-6 hrs prn pain 06/17/14   Tammi Cordarius Benning, PA-C   BP 122/80 mmHg  Pulse 71  Temp(Src) 98.2 F (36.8 C) (Oral)  Resp 20  Ht 5\' 8"  (1.727 m)  Wt 275 lb (124.739 kg)  BMI 41.82 kg/m2  SpO2 100% Physical Exam  Constitutional: He is oriented to person, place, and time. He appears well-developed and well-nourished. No distress.  HENT:  Head: Normocephalic and atraumatic.  Neck: Normal range of motion. Neck supple.  Cardiovascular: Normal rate, regular rhythm, normal heart sounds and intact distal pulses.   No murmur heard. Pulmonary/Chest: Effort normal and breath sounds normal. No respiratory distress. He exhibits tenderness.  Localized tenderness to palpation of the anterior left ribs just below the left breast.  Slight ecchymosis present.  No splinting.  No edema or crepitus.  Abdominal: Soft. He exhibits no distension. There is no tenderness. There is no rebound and no guarding.  Musculoskeletal: Normal range of motion.  Neurological: He is alert and oriented to person, place, and time. He exhibits normal muscle tone. Coordination normal.  Skin: Skin is warm and dry.  Psychiatric: He has a normal mood and affect.  Nursing note and vitals reviewed.   ED Course  Procedures (including critical care time) Labs Review Labs Reviewed - No data to display  Imaging Review Dg  Chest 2 View  06/17/2014   CLINICAL DATA:  Left rib pain following a fall 4 days ago. The patient fell from a bottom step onto the ground.  EXAM: CHEST  2 VIEW  COMPARISON:  06/16/2007.  FINDINGS: Mildly enlarged cardiac silhouette with a mild increase in size. Mild diffuse peribronchial thickening. No rib fracture or pneumothorax seen. Thoracic spine degenerative changes, including changes of DISH.  IMPRESSION: Interval mild cardiomegaly and mild  bronchitic changes.   Electronically Signed   By: Claudie Revering M.D.   On: 06/17/2014 17:03     EKG Interpretation None      MDM   Final diagnoses:  Rib contusion, left, initial encounter    vitals stable.  Pt is well appearing, ambulates with steady gait.  XR neg for fx.  Likely contusion.  Incentive spirometer dispensed.  Pt given PNA precautions and advised to return for any sudden or worsening symptoms.      Patrice Paradise, PA-C 06/19/14 1335  Merryl Hacker, MD 06/19/14 [phone]

## 2014-09-10 DIAGNOSIS — Z0001 Encounter for general adult medical examination with abnormal findings: Secondary | ICD-10-CM | POA: Diagnosis not present

## 2014-09-10 DIAGNOSIS — Z6841 Body Mass Index (BMI) 40.0 and over, adult: Secondary | ICD-10-CM | POA: Diagnosis not present

## 2014-09-10 DIAGNOSIS — R7309 Other abnormal glucose: Secondary | ICD-10-CM | POA: Diagnosis not present

## 2014-09-10 DIAGNOSIS — L989 Disorder of the skin and subcutaneous tissue, unspecified: Secondary | ICD-10-CM | POA: Diagnosis not present

## 2014-09-10 DIAGNOSIS — M1990 Unspecified osteoarthritis, unspecified site: Secondary | ICD-10-CM | POA: Diagnosis not present

## 2014-09-10 DIAGNOSIS — Z0000 Encounter for general adult medical examination without abnormal findings: Secondary | ICD-10-CM | POA: Diagnosis not present

## 2014-09-10 DIAGNOSIS — Z1389 Encounter for screening for other disorder: Secondary | ICD-10-CM | POA: Diagnosis not present

## 2014-10-02 DIAGNOSIS — Z1389 Encounter for screening for other disorder: Secondary | ICD-10-CM | POA: Diagnosis not present

## 2014-10-02 DIAGNOSIS — Z6841 Body Mass Index (BMI) 40.0 and over, adult: Secondary | ICD-10-CM | POA: Diagnosis not present

## 2014-10-02 DIAGNOSIS — E119 Type 2 diabetes mellitus without complications: Secondary | ICD-10-CM | POA: Diagnosis not present

## 2015-01-06 DIAGNOSIS — Z6841 Body Mass Index (BMI) 40.0 and over, adult: Secondary | ICD-10-CM | POA: Diagnosis not present

## 2015-01-06 DIAGNOSIS — E119 Type 2 diabetes mellitus without complications: Secondary | ICD-10-CM | POA: Diagnosis not present

## 2015-01-06 DIAGNOSIS — Z1389 Encounter for screening for other disorder: Secondary | ICD-10-CM | POA: Diagnosis not present

## 2015-02-19 DIAGNOSIS — J069 Acute upper respiratory infection, unspecified: Secondary | ICD-10-CM | POA: Diagnosis not present

## 2015-02-19 DIAGNOSIS — Z6841 Body Mass Index (BMI) 40.0 and over, adult: Secondary | ICD-10-CM | POA: Diagnosis not present

## 2015-02-19 DIAGNOSIS — Z1389 Encounter for screening for other disorder: Secondary | ICD-10-CM | POA: Diagnosis not present

## 2015-02-21 DIAGNOSIS — Z7982 Long term (current) use of aspirin: Secondary | ICD-10-CM | POA: Diagnosis not present

## 2015-02-21 DIAGNOSIS — R05 Cough: Secondary | ICD-10-CM | POA: Diagnosis not present

## 2015-02-21 DIAGNOSIS — I10 Essential (primary) hypertension: Secondary | ICD-10-CM | POA: Diagnosis not present

## 2015-02-21 DIAGNOSIS — J329 Chronic sinusitis, unspecified: Secondary | ICD-10-CM | POA: Diagnosis not present

## 2015-05-01 DIAGNOSIS — J40 Bronchitis, not specified as acute or chronic: Secondary | ICD-10-CM | POA: Diagnosis not present

## 2015-05-01 DIAGNOSIS — J209 Acute bronchitis, unspecified: Secondary | ICD-10-CM | POA: Diagnosis not present

## 2015-05-01 DIAGNOSIS — R062 Wheezing: Secondary | ICD-10-CM | POA: Diagnosis not present

## 2015-05-01 DIAGNOSIS — R05 Cough: Secondary | ICD-10-CM | POA: Diagnosis not present

## 2015-05-01 DIAGNOSIS — Z7982 Long term (current) use of aspirin: Secondary | ICD-10-CM | POA: Diagnosis not present

## 2015-05-18 DIAGNOSIS — W010XXA Fall on same level from slipping, tripping and stumbling without subsequent striking against object, initial encounter: Secondary | ICD-10-CM | POA: Diagnosis not present

## 2015-05-18 DIAGNOSIS — M79641 Pain in right hand: Secondary | ICD-10-CM | POA: Diagnosis not present

## 2015-05-18 DIAGNOSIS — S63501A Unspecified sprain of right wrist, initial encounter: Secondary | ICD-10-CM | POA: Diagnosis not present

## 2015-07-08 DIAGNOSIS — Z6839 Body mass index (BMI) 39.0-39.9, adult: Secondary | ICD-10-CM | POA: Diagnosis not present

## 2015-07-08 DIAGNOSIS — E119 Type 2 diabetes mellitus without complications: Secondary | ICD-10-CM | POA: Diagnosis not present

## 2015-07-08 DIAGNOSIS — Z1389 Encounter for screening for other disorder: Secondary | ICD-10-CM | POA: Diagnosis not present

## 2015-12-01 DIAGNOSIS — Z6839 Body mass index (BMI) 39.0-39.9, adult: Secondary | ICD-10-CM | POA: Diagnosis not present

## 2015-12-01 DIAGNOSIS — E669 Obesity, unspecified: Secondary | ICD-10-CM | POA: Diagnosis not present

## 2015-12-01 DIAGNOSIS — E119 Type 2 diabetes mellitus without complications: Secondary | ICD-10-CM | POA: Diagnosis not present

## 2015-12-01 DIAGNOSIS — Z1389 Encounter for screening for other disorder: Secondary | ICD-10-CM | POA: Diagnosis not present

## 2015-12-01 DIAGNOSIS — E782 Mixed hyperlipidemia: Secondary | ICD-10-CM | POA: Diagnosis not present

## 2015-12-22 DIAGNOSIS — H2513 Age-related nuclear cataract, bilateral: Secondary | ICD-10-CM | POA: Diagnosis not present

## 2015-12-22 DIAGNOSIS — E119 Type 2 diabetes mellitus without complications: Secondary | ICD-10-CM | POA: Diagnosis not present

## 2015-12-22 DIAGNOSIS — H5200 Hypermetropia, unspecified eye: Secondary | ICD-10-CM | POA: Diagnosis not present

## 2016-01-15 DIAGNOSIS — Z23 Encounter for immunization: Secondary | ICD-10-CM | POA: Diagnosis not present

## 2016-02-01 ENCOUNTER — Encounter (HOSPITAL_COMMUNITY): Payer: Self-pay | Admitting: Emergency Medicine

## 2016-02-01 ENCOUNTER — Emergency Department (HOSPITAL_COMMUNITY)
Admission: EM | Admit: 2016-02-01 | Discharge: 2016-02-01 | Disposition: A | Discharge status: Home / Self Care | Payer: Commercial Managed Care - HMO | Attending: Dermatology | Admitting: Dermatology

## 2016-02-01 DIAGNOSIS — R05 Cough: Secondary | ICD-10-CM | POA: Diagnosis not present

## 2016-02-01 DIAGNOSIS — J45909 Unspecified asthma, uncomplicated: Secondary | ICD-10-CM | POA: Insufficient documentation

## 2016-02-01 DIAGNOSIS — Z5321 Procedure and treatment not carried out due to patient leaving prior to being seen by health care provider: Secondary | ICD-10-CM | POA: Insufficient documentation

## 2016-02-01 DIAGNOSIS — R0981 Nasal congestion: Secondary | ICD-10-CM | POA: Insufficient documentation

## 2016-02-01 NOTE — ED Notes (Signed)
Per registration pt left facility. 

## 2016-02-01 NOTE — ED Triage Notes (Signed)
Pt with nasal congestion, dry cough, and dry throat for 2 days.

## 2016-02-02 DIAGNOSIS — J029 Acute pharyngitis, unspecified: Secondary | ICD-10-CM | POA: Diagnosis not present

## 2016-02-02 DIAGNOSIS — J069 Acute upper respiratory infection, unspecified: Secondary | ICD-10-CM | POA: Diagnosis not present

## 2016-02-02 DIAGNOSIS — Z1389 Encounter for screening for other disorder: Secondary | ICD-10-CM | POA: Diagnosis not present

## 2016-02-02 DIAGNOSIS — Z6839 Body mass index (BMI) 39.0-39.9, adult: Secondary | ICD-10-CM | POA: Diagnosis not present

## 2016-03-13 DIAGNOSIS — M542 Cervicalgia: Secondary | ICD-10-CM | POA: Diagnosis not present

## 2016-03-13 DIAGNOSIS — S61217A Laceration without foreign body of left little finger without damage to nail, initial encounter: Secondary | ICD-10-CM | POA: Diagnosis not present

## 2016-03-13 DIAGNOSIS — S0990XA Unspecified injury of head, initial encounter: Secondary | ICD-10-CM | POA: Diagnosis not present

## 2016-03-13 DIAGNOSIS — R51 Headache: Secondary | ICD-10-CM | POA: Diagnosis not present

## 2016-03-13 DIAGNOSIS — S199XXA Unspecified injury of neck, initial encounter: Secondary | ICD-10-CM | POA: Diagnosis not present

## 2016-03-13 DIAGNOSIS — W108XXA Fall (on) (from) other stairs and steps, initial encounter: Secondary | ICD-10-CM | POA: Diagnosis not present

## 2016-03-13 DIAGNOSIS — I10 Essential (primary) hypertension: Secondary | ICD-10-CM | POA: Diagnosis not present

## 2016-03-13 DIAGNOSIS — J45909 Unspecified asthma, uncomplicated: Secondary | ICD-10-CM | POA: Diagnosis not present

## 2016-03-20 ENCOUNTER — Encounter (HOSPITAL_COMMUNITY): Payer: Self-pay

## 2016-03-20 ENCOUNTER — Emergency Department (HOSPITAL_COMMUNITY): Payer: Medicare HMO

## 2016-03-20 ENCOUNTER — Emergency Department (HOSPITAL_COMMUNITY)
Admission: EM | Admit: 2016-03-20 | Discharge: 2016-03-20 | Disposition: A | Discharge status: Home / Self Care | Payer: Medicare HMO | Attending: Emergency Medicine | Admitting: Emergency Medicine

## 2016-03-20 DIAGNOSIS — Z79899 Other long term (current) drug therapy: Secondary | ICD-10-CM | POA: Insufficient documentation

## 2016-03-20 DIAGNOSIS — Y939 Activity, unspecified: Secondary | ICD-10-CM | POA: Insufficient documentation

## 2016-03-20 DIAGNOSIS — M79645 Pain in left finger(s): Secondary | ICD-10-CM | POA: Diagnosis not present

## 2016-03-20 DIAGNOSIS — W0110XA Fall on same level from slipping, tripping and stumbling with subsequent striking against unspecified object, initial encounter: Secondary | ICD-10-CM | POA: Diagnosis not present

## 2016-03-20 DIAGNOSIS — M542 Cervicalgia: Secondary | ICD-10-CM | POA: Diagnosis not present

## 2016-03-20 DIAGNOSIS — Y929 Unspecified place or not applicable: Secondary | ICD-10-CM | POA: Insufficient documentation

## 2016-03-20 DIAGNOSIS — S46811A Strain of other muscles, fascia and tendons at shoulder and upper arm level, right arm, initial encounter: Secondary | ICD-10-CM | POA: Insufficient documentation

## 2016-03-20 DIAGNOSIS — Y999 Unspecified external cause status: Secondary | ICD-10-CM | POA: Insufficient documentation

## 2016-03-20 DIAGNOSIS — S6992XA Unspecified injury of left wrist, hand and finger(s), initial encounter: Secondary | ICD-10-CM | POA: Diagnosis not present

## 2016-03-20 DIAGNOSIS — J45909 Unspecified asthma, uncomplicated: Secondary | ICD-10-CM | POA: Diagnosis not present

## 2016-03-20 DIAGNOSIS — S46812A Strain of other muscles, fascia and tendons at shoulder and upper arm level, left arm, initial encounter: Secondary | ICD-10-CM | POA: Diagnosis not present

## 2016-03-20 DIAGNOSIS — S4992XA Unspecified injury of left shoulder and upper arm, initial encounter: Secondary | ICD-10-CM | POA: Diagnosis not present

## 2016-03-20 DIAGNOSIS — M25512 Pain in left shoulder: Secondary | ICD-10-CM | POA: Diagnosis not present

## 2016-03-20 DIAGNOSIS — S161XXA Strain of muscle, fascia and tendon at neck level, initial encounter: Secondary | ICD-10-CM | POA: Diagnosis not present

## 2016-03-20 DIAGNOSIS — S4991XA Unspecified injury of right shoulder and upper arm, initial encounter: Secondary | ICD-10-CM | POA: Diagnosis not present

## 2016-03-20 DIAGNOSIS — W19XXXA Unspecified fall, initial encounter: Secondary | ICD-10-CM

## 2016-03-20 DIAGNOSIS — M25511 Pain in right shoulder: Secondary | ICD-10-CM | POA: Diagnosis not present

## 2016-03-20 DIAGNOSIS — S299XXA Unspecified injury of thorax, initial encounter: Secondary | ICD-10-CM | POA: Diagnosis not present

## 2016-03-20 DIAGNOSIS — S46912A Strain of unspecified muscle, fascia and tendon at shoulder and upper arm level, left arm, initial encounter: Secondary | ICD-10-CM | POA: Diagnosis not present

## 2016-03-20 DIAGNOSIS — S62627A Displaced fracture of medial phalanx of left little finger, initial encounter for closed fracture: Secondary | ICD-10-CM

## 2016-03-20 DIAGNOSIS — S199XXA Unspecified injury of neck, initial encounter: Secondary | ICD-10-CM | POA: Diagnosis not present

## 2016-03-20 DIAGNOSIS — S46911A Strain of unspecified muscle, fascia and tendon at shoulder and upper arm level, right arm, initial encounter: Secondary | ICD-10-CM | POA: Diagnosis not present

## 2016-03-20 MED ORDER — TRAMADOL HCL 50 MG PO TABS: 50.0000 mg | ORAL_TABLET | Freq: Four times a day (QID) | ORAL | 0 refills | Status: DC | PRN

## 2016-03-20 NOTE — ED Provider Notes (Addendum)
TIME SEEN: 2:35 AM  CHIEF COMPLAINT: fall  HPI: Pt is a 71 y.o. male with history of asthma who presents to the emergency department with complaints of neck pain, bilateral shoulder pain, left fifth finger pain after a fall that occurred a week ago. States that he tripped on his porch and fell onto his right side striking the right side of his head. States he was seen at [facility] and had CT scans of his head, cervical spine that he was told were normal. He states he was told to take Tylenol for pain. States that this is not helping with his pain. He denies any headache at this time, no numbness, tingling or focal weakness. Not on antiplatelets or anticoagulants. Complaining of lower cervical and upper thoracic pain, bilateral shoulder pain and left fifth finger pain. He is right-hand dominant. No new injury.  ROS: See HPI Constitutional: no fever  Eyes: no drainage  ENT: no runny nose   Cardiovascular:  no chest pain  Resp: no SOB  GI: no vomiting GU: no dysuria Integumentary: no rash  Allergy: no hives  Musculoskeletal: no leg swelling  Neurological: no slurred speech ROS otherwise negative  PAST MEDICAL HISTORY/PAST SURGICAL HISTORY:  Past Medical History:  Diagnosis Date  . Asthma   . Bronchitis     MEDICATIONS:  Prior to Admission medications   Medication Sig Start Date End Date Taking? Authorizing Provider  acetaminophen (TYLENOL) 325 MG tablet Take 650 mg by mouth every 6 (six) hours as needed.   Yes Historical Provider, MD  HYDROcodone-acetaminophen (NORCO/VICODIN) 5-325 MG per tablet Take one-two tabs po q 4-6 hrs prn pain 06/17/14   Tammy Triplett, PA-C    ALLERGIES:  Allergies  Allergen Reactions  . Codeine Other (See Comments)    MAKES HIM SLEEP ALL TIME    SOCIAL HISTORY:  Social History  Substance Use Topics  . Smoking status: Never Smoker  . Smokeless tobacco: Never Used  . Alcohol use No    FAMILY HISTORY: No family history on file.  EXAM: BP  178/86   Pulse 64   Temp 97.5 F (36.4 C) (Oral)   Resp 20   Ht 5\' 8"  (1.727 m)   Wt 259 lb (117.5 kg)   SpO2 96%   BMI 39.38 kg/m  CONSTITUTIONAL: Alert and oriented and responds appropriately to questions. Well-appearing; well-nourished; GCS 15, Elderly, in no distress HEAD: Normocephalic; atraumatic EYES: Conjunctivae clear, PERRL, EOMI ENT: normal nose; no rhinorrhea; moist mucous membranes; pharynx without lesions noted; no dental injury; no septal hematoma NECK: Supple, no meningismus, no LAD; tender over the lower cervical spine without step-off or deformity, trachea is midline CARD: RRR; S1 and S2 appreciated; no murmurs, no clicks, no rubs, no gallops RESP: Normal chest excursion without splinting or tachypnea; breath sounds clear and equal bilaterally; no wheezes, no rhonchi, no rales; no hypoxia or respiratory distress CHEST:  chest wall stable, no crepitus or ecchymosis or deformity, nontender to palpation; no flail chest ABD/GI: Normal bowel sounds; non-distended; soft, non-tender, no rebound, no guarding; no ecchymosis or other lesions noted PELVIS:  stable, nontender to palpation BACK:  The back appears normal and is tender over the upper thoracic spine without step-off or deformity, no other lesions noted to the back EXT: Patient has superficial skin avulsion that is healing to the lateral aspect of the fifth digit on the left hand. Tenderness over the PIP but has full range of motion. Tender over bilateral shoulders without loss of fullness.  Decreased range of motion in his shoulder secondary to pain in the shoulders and neck. Otherwise Normal ROM in all joints; otherwise x-rays are non-tender to palpation; no edema; normal capillary refill; no cyanosis, no bony deformity of patient's extremities, no joint effusion, compartments are soft, extremities are warm and well-perfused, no ecchymosis or lacerations; 2+ radial pulses bilaterally   SKIN: Normal color for age and race;  warm NEURO: Moves all extremities equally, sensation to light touch intact diffusely, cranial nerves II through XII intact; normal gait PSYCH: The patient's mood and manner are appropriate. Grooming and personal hygiene are appropriate.  MEDICAL DECISION MAKING: Patient here with mechanical fall one week ago. Reports negative imaging at [facility]. We will attempt to obtain these records. Given he is elderly and complaining of midline spinal tenderness, I feel we need to obtain CT imaging of his cervical and thoracic spine, x-rays of his shoulders and left fifth finger. He drove himself to the emergency department and therefore I cannot provide him with any medications that may sedate him. He denies any headache or focal neurologic deficit that this time. Reports having a negative head CT at [facility]. I feel that we do not need to rescan his head. We can wait and receive outside hospital records.  ED PROGRESS: 3:20 AM  OSH records received from [facility] from 03/13/16. CT head showed no acute intracranial abnormality. Had possible nondisplaced right nasal bone fracture. CT of the cervical spine also unremarkable. Labs at that time reassuring including CBC, BMP, troponin.  3:35 AM  Pt's right and left shoulder x-rays show calcific tendinitis but no acute osseous abnormality.  CT scans of his cervical and thoracic spine show no acute abnormality. X-ray of the left fifth finger shows a tiny fracture at the volar margin of the base of the fifth middle phalanx which is where he is tender. We will put him in a splint for comfort and have him wear this for the next 2 weeks. He has full flexion and extension of his finger. I do not feel he will need outpatient orthopedic follow-up for this. We'll discharge with short prescription for tramadol for pain control. Have advised him to follow-up with his PCP if symptoms continue.   At this time, I do not feel there is any life-threatening  condition present. I have reviewed and discussed all results (EKG, imaging, lab, urine as appropriate) and exam findings with patient/family. I have reviewed nursing notes and appropriate previous records.  I feel the patient is safe to be discharged home without further emergent workup and can continue workup as an outpatient as needed. Discussed usual and customary return precautions. Patient/family verbalize understanding and are comfortable with this plan.  Outpatient follow-up has been provided. All questions have been answered.  SPLINT APPLICATION Date/Time: [id number] AM Authorized by: Nyra Jabs Consent: Verbal consent obtained. Risks and benefits: risks, benefits and alternatives were discussed Consent given by: patient Splint applied by: nurse Location details: left fifth finger Splint type: aluminum finger splint Supplies used: aluminum finger splint Post-procedure: The splinted body part was neurovascularly unchanged following the procedure. Patient tolerance: Patient tolerated the procedure well with no immediate complications.      [place], DO 03/20/16 [place], DO 03/20/16 [id number]

## 2016-03-20 NOTE — ED Notes (Signed)
Pt returned from xray,  

## 2016-03-20 NOTE — ED Notes (Signed)
Pt states that he fell bending the left side of his neck when he hit his head a few days ago, was seen at [facility] er and had xrays performed, reports that he continues to have pain in bilateral shoulder area, worse on left side and left side of neck area, pain can be reproduced with palpation of left side of neck area,

## 2016-03-20 NOTE — ED Triage Notes (Signed)
Pt states he fell a week ago, states he injured his right shoulder, went to [facility] and had x-rays which were negative.  Pt states he was told to take tylenol, but the pain has not subsided.

## 2016-03-20 NOTE — ED Notes (Signed)
Patient transported to X-ray 

## 2016-03-20 NOTE — Discharge Instructions (Signed)
You may wear the splint on your finger for the next 2 weeks for comfort. There was a very tiny fracture to the middle of your finger that does not need surgery or outpatient follow-up with orthopedic doctor. CT scans done at [facility] were reviewed and showed no injury to your head or neck other than a possible nasal bone fracture. CT scans done here of your cervical and thoracic spine as well as x-rays of both of your shoulder showed no acute injury today. Please follow up with your primary care physician if symptoms continue.

## 2016-04-14 HISTORY — PX: CARDIAC CATHETERIZATION: SHX172

## 2016-04-23 ENCOUNTER — Emergency Department (HOSPITAL_COMMUNITY): Payer: Medicare Other

## 2016-04-23 ENCOUNTER — Inpatient Hospital Stay (HOSPITAL_COMMUNITY)
Admission: EM | Admit: 2016-04-23 | Discharge: 2016-04-25 | DRG: 281 | Discharge status: Against Medical Advice | Payer: Medicare Other | Attending: Internal Medicine | Admitting: Internal Medicine

## 2016-04-23 ENCOUNTER — Encounter (HOSPITAL_COMMUNITY): Payer: Self-pay | Admitting: Emergency Medicine

## 2016-04-23 DIAGNOSIS — I2510 Atherosclerotic heart disease of native coronary artery without angina pectoris: Secondary | ICD-10-CM | POA: Diagnosis not present

## 2016-04-23 DIAGNOSIS — M47814 Spondylosis without myelopathy or radiculopathy, thoracic region: Secondary | ICD-10-CM | POA: Diagnosis present

## 2016-04-23 DIAGNOSIS — I252 Old myocardial infarction: Secondary | ICD-10-CM | POA: Diagnosis present

## 2016-04-23 DIAGNOSIS — Z6839 Body mass index (BMI) 39.0-39.9, adult: Secondary | ICD-10-CM

## 2016-04-23 DIAGNOSIS — J45909 Unspecified asthma, uncomplicated: Secondary | ICD-10-CM | POA: Diagnosis present

## 2016-04-23 DIAGNOSIS — I119 Hypertensive heart disease without heart failure: Secondary | ICD-10-CM | POA: Diagnosis present

## 2016-04-23 DIAGNOSIS — Z01810 Encounter for preprocedural cardiovascular examination: Secondary | ICD-10-CM | POA: Diagnosis not present

## 2016-04-23 DIAGNOSIS — M549 Dorsalgia, unspecified: Secondary | ICD-10-CM | POA: Diagnosis present

## 2016-04-23 DIAGNOSIS — E669 Obesity, unspecified: Secondary | ICD-10-CM | POA: Diagnosis present

## 2016-04-23 DIAGNOSIS — I160 Hypertensive urgency: Secondary | ICD-10-CM | POA: Diagnosis present

## 2016-04-23 DIAGNOSIS — M47812 Spondylosis without myelopathy or radiculopathy, cervical region: Secondary | ICD-10-CM | POA: Diagnosis present

## 2016-04-23 DIAGNOSIS — E781 Pure hyperglyceridemia: Secondary | ICD-10-CM | POA: Diagnosis present

## 2016-04-23 DIAGNOSIS — I4729 Other ventricular tachycardia: Secondary | ICD-10-CM

## 2016-04-23 DIAGNOSIS — E785 Hyperlipidemia, unspecified: Secondary | ICD-10-CM | POA: Diagnosis present

## 2016-04-23 DIAGNOSIS — I214 Non-ST elevation (NSTEMI) myocardial infarction: Principal | ICD-10-CM

## 2016-04-23 DIAGNOSIS — I25118 Atherosclerotic heart disease of native coronary artery with other forms of angina pectoris: Secondary | ICD-10-CM | POA: Diagnosis present

## 2016-04-23 DIAGNOSIS — I472 Ventricular tachycardia: Secondary | ICD-10-CM | POA: Diagnosis present

## 2016-04-23 DIAGNOSIS — R7303 Prediabetes: Secondary | ICD-10-CM | POA: Diagnosis present

## 2016-04-23 DIAGNOSIS — I5042 Chronic combined systolic (congestive) and diastolic (congestive) heart failure: Secondary | ICD-10-CM

## 2016-04-23 DIAGNOSIS — M4810 Ankylosing hyperostosis [Forestier], site unspecified: Secondary | ICD-10-CM | POA: Diagnosis present

## 2016-04-23 HISTORY — DX: Other ventricular tachycardia: I47.29

## 2016-04-23 HISTORY — DX: Ventricular tachycardia: I47.2

## 2016-04-23 HISTORY — DX: Hyperlipidemia, unspecified: E78.5

## 2016-04-23 HISTORY — DX: Non-ST elevation (NSTEMI) myocardial infarction: I21.4

## 2016-04-23 LAB — MRSA PCR SCREENING: MRSA BY PCR: NEGATIVE

## 2016-04-23 LAB — COMPREHENSIVE METABOLIC PANEL
ALT: 21 U/L (ref 17–63)
AST: 20 U/L (ref 15–41)
Albumin: 4 g/dL (ref 3.5–5.0)
Alkaline Phosphatase: 47 U/L (ref 38–126)
Anion gap: 10 (ref 5–15)
BUN: 25 mg/dL — AB (ref 6–20)
CHLORIDE: 103 mmol/L (ref 101–111)
CO2: 22 mmol/L (ref 22–32)
CREATININE: 0.89 mg/dL (ref 0.61–1.24)
Calcium: 9.2 mg/dL (ref 8.9–10.3)
GFR calc Af Amer: >60 mL/min (ref >60)
GFR calc non Af Amer: >60 mL/min (ref >60)
Glucose, Bld: 148 mg/dL — ABNORMAL HIGH (ref 65–99)
Potassium: 3.6 mmol/L (ref 3.5–5.1)
SODIUM: 135 mmol/L (ref 135–145)
Total Bilirubin: 0.6 mg/dL (ref 0.3–1.2)
Total Protein: 7.4 g/dL (ref 6.5–8.1)

## 2016-04-23 LAB — LIPID PANEL
Cholesterol: 202 mg/dL — ABNORMAL HIGH (ref 0–200)
HDL: 37 mg/dL — ABNORMAL LOW (ref >40)
LDL CALC: 98 mg/dL (ref 0–99)
TRIGLYCERIDES: 337 mg/dL — AB (ref <150)
Total CHOL/HDL Ratio: 5.5 RATIO
VLDL: 67 mg/dL — AB (ref 0–40)

## 2016-04-23 LAB — CBC
HEMATOCRIT: 45.4 % (ref 39.0–52.0)
HEMOGLOBIN: 15.6 g/dL (ref 13.0–17.0)
MCH: 32.4 pg (ref 26.0–34.0)
MCHC: 34.4 g/dL (ref 30.0–36.0)
MCV: 94.2 fL (ref 78.0–100.0)
Platelets: 202 10*3/uL (ref 150–400)
RBC: 4.82 MIL/uL (ref 4.22–5.81)
RDW: 14.5 % (ref 11.5–15.5)
WBC: 7.7 10*3/uL (ref 4.0–10.5)

## 2016-04-23 LAB — DIFFERENTIAL
BASOS ABS: 0 10*3/uL (ref 0.0–0.1)
Basophils Relative: 1 %
Eosinophils Absolute: 0.3 10*3/uL (ref 0.0–0.7)
Eosinophils Relative: 3 %
LYMPHS ABS: 3.1 10*3/uL (ref 0.7–4.0)
Lymphocytes Relative: 40 %
MONOS PCT: 8 %
Monocytes Absolute: 0.6 10*3/uL (ref 0.1–1.0)
NEUTROS ABS: 3.7 10*3/uL (ref 1.7–7.7)
Neutrophils Relative %: 48 %

## 2016-04-23 LAB — HEPARIN LEVEL (UNFRACTIONATED)
HEPARIN UNFRACTIONATED: 0.1 [IU]/mL — AB (ref 0.30–0.70)
HEPARIN UNFRACTIONATED: 0.3 [IU]/mL (ref 0.30–0.70)

## 2016-04-23 LAB — PROTIME-INR
INR: 1
Prothrombin Time: 13.2 seconds (ref 11.4–15.2)

## 2016-04-23 LAB — I-STAT CHEM 8, ED
BUN: 28 mg/dL — ABNORMAL HIGH (ref 6–20)
CALCIUM ION: 1.16 mmol/L (ref 1.15–1.40)
CHLORIDE: 104 mmol/L (ref 101–111)
CREATININE: 1 mg/dL (ref 0.61–1.24)
GLUCOSE: 149 mg/dL — AB (ref 65–99)
HCT: 47 % (ref 39.0–52.0)
Hemoglobin: 16 g/dL (ref 13.0–17.0)
Potassium: 3.7 mmol/L (ref 3.5–5.1)
Sodium: 139 mmol/L (ref 135–145)
TCO2: 26 mmol/L (ref 0–100)

## 2016-04-23 LAB — APTT: APTT: 33 s (ref 24–36)

## 2016-04-23 LAB — TROPONIN I
TROPONIN I: 1.07 ng/mL — AB (ref <0.03)
TROPONIN I: 2.57 ng/mL — AB (ref <0.03)
Troponin I: 0.1 ng/mL (ref <0.03)
Troponin I: 4.98 ng/mL (ref <0.03)
Troponin I: 8.24 ng/mL (ref <0.03)

## 2016-04-23 LAB — MAGNESIUM: Magnesium: 1.4 mg/dL — ABNORMAL LOW (ref 1.7–2.4)

## 2016-04-23 MED ORDER — NITROGLYCERIN IN D5W 200-5 MCG/ML-% IV SOLN
0.0000 ug/min | INTRAVENOUS | Status: DC
  Administered 2016-04-23 – 2016-04-25 (×2): 20 ug/min via INTRAVENOUS
  Filled 2016-04-23: qty 250

## 2016-04-23 MED ORDER — PNEUMOCOCCAL VAC POLYVALENT 25 MCG/0.5ML IJ INJ
0.5000 mL | INJECTION | INTRAMUSCULAR | Status: DC
  Filled 2016-04-23: qty 0.5

## 2016-04-23 MED ORDER — METOPROLOL TARTRATE 12.5 MG HALF TABLET
12.5000 mg | ORAL_TABLET | Freq: Two times a day (BID) | ORAL | Status: DC
  Administered 2016-04-23 – 2016-04-25 (×4): 12.5 mg via ORAL
  Filled 2016-04-23 (×5): qty 1

## 2016-04-23 MED ORDER — ONDANSETRON HCL 4 MG PO TABS: 4.0000 mg | ORAL_TABLET | Freq: Four times a day (QID) | ORAL | Status: DC | PRN

## 2016-04-23 MED ORDER — NITROGLYCERIN 0.4 MG SL SUBL: 0.4000 mg | SUBLINGUAL_TABLET | SUBLINGUAL | Status: DC | PRN

## 2016-04-23 MED ORDER — HEPARIN (PORCINE) IN NACL 100-0.45 UNIT/ML-% IJ SOLN
10.0000 [IU]/kg/h | INTRAMUSCULAR | Status: DC
  Administered 2016-04-23: 10 [IU]/kg/h via INTRAVENOUS
  Filled 2016-04-23: qty 250

## 2016-04-23 MED ORDER — MAGNESIUM SULFATE 4 GM/100ML IV SOLN
4.0000 g | Freq: Once | INTRAVENOUS | Status: AC
  Administered 2016-04-23: 4 g via INTRAVENOUS
  Filled 2016-04-23: qty 100

## 2016-04-23 MED ORDER — ASPIRIN 81 MG PO CHEW
324.0000 mg | CHEWABLE_TABLET | Freq: Once | ORAL | Status: AC
  Administered 2016-04-23: 324 mg via ORAL
  Filled 2016-04-23: qty 4

## 2016-04-23 MED ORDER — HEPARIN BOLUS VIA INFUSION
4000.0000 [IU] | Freq: Once | INTRAVENOUS | Status: AC
  Administered 2016-04-23: 4000 [IU] via INTRAVENOUS
  Filled 2016-04-23: qty 4000

## 2016-04-23 MED ORDER — IOPAMIDOL (ISOVUE-370) INJECTION 76%
100.0000 mL | Freq: Once | INTRAVENOUS | Status: AC | PRN
  Administered 2016-04-23: 100 mL via INTRAVENOUS

## 2016-04-23 MED ORDER — SODIUM CHLORIDE 0.9 % IV SOLN
10.0000 mL/h | INTRAVENOUS | Status: DC
  Administered 2016-04-23: 20 mL/h via INTRAVENOUS

## 2016-04-23 MED ORDER — HEPARIN (PORCINE) IN NACL 100-0.45 UNIT/ML-% IJ SOLN
1700.0000 [IU]/h | INTRAMUSCULAR | Status: DC
  Administered 2016-04-23 (×2): 1400 [IU]/h via INTRAVENOUS
  Administered 2016-04-24: 1500 [IU]/h via INTRAVENOUS
  Administered 2016-04-25: 1700 [IU]/h via INTRAVENOUS
  Filled 2016-04-23 (×3): qty 250

## 2016-04-23 MED ORDER — HEPARIN (PORCINE) IN NACL 100-0.45 UNIT/ML-% IJ SOLN
1200.0000 [IU]/kg/h | INTRAMUSCULAR | Status: DC
  Administered 2016-04-23: 1200 [IU]/kg/h via INTRAVENOUS

## 2016-04-23 MED ORDER — HEPARIN SODIUM (PORCINE) 5000 UNIT/ML IJ SOLN: 4000.0000 [IU] | INTRAMUSCULAR | Status: DC

## 2016-04-23 MED ORDER — TRAMADOL HCL 50 MG PO TABS
50.0000 mg | ORAL_TABLET | Freq: Four times a day (QID) | ORAL | Status: DC | PRN
  Administered 2016-04-23 – 2016-04-24 (×2): 50 mg via ORAL
  Filled 2016-04-23 (×2): qty 1

## 2016-04-23 MED ORDER — NITROGLYCERIN IN D5W 200-5 MCG/ML-% IV SOLN
5.0000 ug/min | Freq: Once | INTRAVENOUS | Status: AC
  Administered 2016-04-23: 5 ug/min via INTRAVENOUS
  Filled 2016-04-23: qty 250

## 2016-04-23 MED ORDER — ATORVASTATIN CALCIUM 80 MG PO TABS
80.0000 mg | ORAL_TABLET | Freq: Every day | ORAL | Status: DC
  Administered 2016-04-23 – 2016-04-25 (×3): 80 mg via ORAL
  Filled 2016-04-23 (×3): qty 1

## 2016-04-23 MED ORDER — ASPIRIN EC 81 MG PO TBEC: 81.0000 mg | DELAYED_RELEASE_TABLET | Freq: Every day | ORAL | Status: DC

## 2016-04-23 MED ORDER — ASPIRIN 325 MG PO TABS: 325.0000 mg | ORAL_TABLET | Freq: Every day | ORAL | Status: DC

## 2016-04-23 MED ORDER — ACETAMINOPHEN 325 MG PO TABS: 650.0000 mg | ORAL_TABLET | ORAL | Status: DC | PRN

## 2016-04-23 MED ORDER — ONDANSETRON HCL 4 MG/2ML IJ SOLN
4.0000 mg | Freq: Four times a day (QID) | INTRAMUSCULAR | Status: DC | PRN
  Administered 2016-04-23 – 2016-04-24 (×2): 4 mg via INTRAVENOUS
  Filled 2016-04-23 (×2): qty 2

## 2016-04-23 MED ORDER — MORPHINE SULFATE (PF) 4 MG/ML IV SOLN
4.0000 mg | Freq: Once | INTRAVENOUS | Status: AC
  Administered 2016-04-23: 4 mg via INTRAVENOUS
  Filled 2016-04-23: qty 1

## 2016-04-23 MED ORDER — HYDRALAZINE HCL 25 MG PO TABS: 25.0000 mg | ORAL_TABLET | Freq: Four times a day (QID) | ORAL | Status: DC | PRN

## 2016-04-23 MED ORDER — IOPAMIDOL (ISOVUE-370) INJECTION 76%
INTRAVENOUS | Status: AC
  Filled 2016-04-23: qty 100

## 2016-04-23 MED ORDER — NITROGLYCERIN IN D5W 200-5 MCG/ML-% IV SOLN: 5.0000 ug/min | INTRAVENOUS | Status: DC

## 2016-04-23 NOTE — ED Notes (Signed)
[phone]

## 2016-04-23 NOTE — ED Notes (Signed)
Spoke with Dr danford, can send to stepdown or tele bed. per [facility] no tele or stepdown available. Dr danford states inform EDP of no beds and call cardiology for further plan. Spoke with dr Tomi Bamberger states pt is no longer hers and is the responsibility for Danford to take care of. Told me to call hospitalist at [facility] for further instructions

## 2016-04-23 NOTE — ED Triage Notes (Signed)
Pt states he has been having pain between shoulders for 1 month, yesterday started having chest pain that radiates down both arms

## 2016-04-23 NOTE — ED Notes (Signed)
Pt transferred via [facility] at this time

## 2016-04-23 NOTE — Progress Notes (Signed)
ANTICOAGULATION CONSULT NOTE - Initial Consult  Pharmacy Consult for Heparin Indication: chest pain/ACS  Allergies  Allergen Reactions  . Codeine Other (See Comments)    MAKES HIM SLEEP ALL TIME    Patient Measurements: Height: 5\' 8"  (172.7 cm) Weight: 259 lb (117.5 kg) IBW/kg (Calculated) : 68.4 HEPARIN DW (KG): 95.1  Vital Signs: Temp: 97.5 F (36.4 C) (02/10 0320) Temp Source: Oral (02/10 0320) BP: 138/67 (02/10 1030) Pulse Rate: 59 (02/10 1030)  Labs:  Recent Labs  04/23/16 0340 04/23/16 0419 04/23/16 0655 04/23/16 0947  HGB 15.6 16.0  --   --   HCT 45.4 47.0  --   --   PLT 202  --   --   --   APTT 33  --   --   --   LABPROT 13.2  --   --   --   INR 1.00  --   --   --   CREATININE 0.89 1.00  --   --   TROPONINI 0.10*  --  1.07* 2.57*    Estimated Creatinine Clearance: 85.6 mL/min (by C-G formula based on SCr of 1 mg/dL).   Medical History: Past Medical History:  Diagnosis Date  . Asthma   . Bronchitis     Medications:  See med rec  Assessment: 71 yo male presented with chest pain. Troponins trending up. EKG showed NSTEMI. Plan is to start heparin, Nitroglycerin and aspirin and transfer to [facility].  Goal of Therapy:  Heparin level 0.3-0.7 units/ml Monitor platelets by anticoagulation protocol: Yes   Plan:  Continue heparin infusion at 1200  units/hr Check anti-Xa level in 8 hours and daily while on heparin Continue to monitor H&H and platelets  Isac Sarna, BS Vena Austria, BCPS Clinical Pharmacist Pager [phone]  Isac Sarna L 04/23/2016,11:26 AM

## 2016-04-23 NOTE — ED Notes (Signed)
Family at bedside. Aware of transfer to [facility]

## 2016-04-23 NOTE — ED Notes (Signed)
Dr Smith Mince called to update me . States she spoke with Dr Claiborne Billings and is now waiting on nstemi cardiology to return call. Per Dr Dessa Phi pt will be transferred to [facility]

## 2016-04-23 NOTE — Progress Notes (Signed)
Discussed transfer with nursing staff.  Patient is 71 yo M with asthma, presents with back pain.  Initially CODE STEMI was called.  Was discussed with STEMI MD who doubted, recommended repeat ECG and CTA.  CTA showed no dissection or PE.    Repeat troponin up to 1.0 and repeat ECG showed no ST elevations and so CODE STEMI canceled.  Transfer was supposed to be arranged to [facility], but it appears a bed was not requested, and [facility] could not transport patient without assigned bed.  The patient has an NSTEMI. He is on appropriate treatment with heparin, morphine, nitro and is pain free.  I accept to tele bed, OBS status if bed is available.  In meantime, I recommend nursing discuss with EDP to find alternative disposition if we have no beds in a timely manner.

## 2016-04-23 NOTE — Progress Notes (Signed)
ANTICOAGULATION CONSULT NOTE - Follow Up Consult  Pharmacy Consult for heparin Indication: NSTEMI  Labs:  Recent Labs  04/23/16 0340 04/23/16 0419 04/23/16 0655 04/23/16 0947 04/23/16 1424 04/23/16 1611 04/23/16 2103  HGB 15.6 16.0  --   --   --   --   --   HCT 45.4 47.0  --   --   --   --   --   PLT 202  --   --   --   --   --   --   APTT 33  --   --   --   --   --   --   LABPROT 13.2  --   --   --   --   --   --   INR 1.00  --   --   --   --   --   --   HEPARINUNFRC  --   --   --   --  0.10*  --  0.30  CREATININE 0.89 1.00  --   --   --   --   --   TROPONINI 0.10*  --  1.07* 2.57*  --  4.98*  --     Assessment: 71yo male now therapeutic on heparin after rate change though at very low end of goal.  Goal of Therapy:  Heparin level 0.3-0.7 units/ml   Plan:  Will increase heparin gtt slightly to 1500 units/hr to prevent from dropping below goal and check level with am labs.  Wynona Neat, PharmD, BCPS  04/23/2016,10:05 PM

## 2016-04-23 NOTE — ED Notes (Addendum)
Spoke with Dr Jens Som states if pt is stable to hold pt in [facility] ED until bed available. Dr Jens Som aware of troponin 2.57 Pt currently sitting up in bed talking to visitor. Denies CP or nausea at this time States "I feel like getting up and dancing"

## 2016-04-23 NOTE — H&P (Signed)
History and Physical   Patient ID: Albert Carter, MRN: [id number], DOB: [birth year]   Date of Encounter: 04/23/2016, 3:22 PM  Primary Care Provider: Purvis Kilts, MD Cardiologist: new Electrophysiologist:  n/a  Chief Complaint:  NSTEMI  History of Present Illness: Albert Carter is a 71 y.o. male  He transferred from [facility] where he presented with chest pain and had a non-STEMI  He has no known coronary disease. His chest pain presentation included  midsternal with radiation to the back and arms bilaterally as well as his neck. About 3 weeks before he had had a similar episode of nonexertional chest discomfort.  His first episode was associated with shortness of breath.  He has mild dyspnea on exertion. He has occasional nocturnal dyspnea. He does not have peripheral edema palpitations.  He does not smoke. He does not use alcohol.    Past Medical History:  Diagnosis Date  . Asthma   . Bronchitis      Past Surgical History:  Procedure Laterality Date  . COLONOSCOPY  03/24/2011   Procedure: COLONOSCOPY;  Surgeon: Daneil Dolin, MD;  Location[facility];  Service: Endoscopy;  Laterality: N/A;  11:30 AM  . FACIAL FRACTURE SURGERY  1973   [facility]  . [place]   broke jaw and reset      Prior to Admission medications   Not on File      Allergies: Allergies  Allergen Reactions  . Codeine Other (See Comments)    MAKES HIM SLEEP ALL TIME     Social History:  The patient  reports that he has never smoked. He has never used smokeless tobacco. He reports that he does not drink alcohol or use drugs.   Family History:  The patient's family history is not on file.   ROS:  Please see the history of present illness.     All other systems reviewed and negative.   Vital Signs: Blood pressure 132/79, pulse 62, temperature 98.1 F (36.7 C), resp. rate 14, height 5\' 8"  (1.727 m), weight 255 lb 15.3 oz (116.1 kg), SpO2 97 %.  PHYSICAL EXAM: General:   Well nourished, well developed, in no acute distress  HEENT: normal Lymph: no adenopathy Neck: no JVD Endocrine:  No thryomegaly Vascular: No carotid bruits; FA pulses 2+ bilaterally without bruits  Cardiac:  normal S1, S2; RRR; no murmur  Lungs:  clear to auscultation bilaterally, no wheezing, rhonchi or rales  Abd: soft, nontender, no hepatomegaly  Ext: no edema Musculoskeletal:  No deformities, BUE and BLE strength normal and equal Skin: warm and dry  Neuro:  CNs 2-12 intact, no focal abnormalities noted Psych:  Normal affect      Labs:   Lab Results  Component Value Date   WBC 7.7 04/23/2016   HGB 16.0 04/23/2016   HCT 47.0 04/23/2016   MCV 94.2 04/23/2016   PLT 202 04/23/2016     Recent Labs Lab 04/23/16 0340 04/23/16 0419  NA 135 139  K 3.6 3.7  CL 103 104  CO2 22  --   BUN 25* 28*  CREATININE 0.89 1.00  CALCIUM 9.2  --   PROT 7.4  --   BILITOT 0.6  --   ALKPHOS 47  --   ALT 21  --   AST 20  --   GLUCOSE 148* 149*     Recent Labs  04/23/16 0340 04/23/16 0655 04/23/16 0947  TROPONINI 0.10* 1.07* 2.57*    Lab Results  Component Value Date   CHOL 202 (H) 04/23/2016   HDL 37 (L) 04/23/2016   LDLCALC 98 04/23/2016   TRIG 337 (H) 04/23/2016    No results found for: DDIMER   Radiology/Studies:   Dg Chest Port 1 View  Result Date: 04/23/2016 CLINICAL DATA:  Upper back pain, chest pain, hypertension. EXAM: PORTABLE CHEST 1 VIEW COMPARISON:  05/01/2015 FINDINGS: Shallow inspiration. Mild cardiac enlargement. No pulmonary vascular congestion or edema. No focal consolidation. No blunting of costophrenic angles. No pneumothorax. Mediastinal contours appear intact. IMPRESSION: No active disease. Electronically Signed   By: Lucienne Capers M.D.   On: 04/23/2016 04:07   Ct Angio Chest/abd/pel For Dissection W And/or W/wo  Result Date: 04/23/2016 CLINICAL DATA:  Pain between the shoulders for 1 month. Chest pain radiating down both arms beginning  yesterday. Hypertension. EXAM: CT ANGIOGRAPHY CHEST, ABDOMEN AND PELVIS TECHNIQUE: Multidetector CT imaging through the chest, abdomen and pelvis was performed using the standard protocol during bolus administration of intravenous contrast. Multiplanar reconstructed images and MIPs were obtained and reviewed to evaluate the vascular anatomy. CONTRAST:  100 mL Isovue 370 COMPARISON:  None. FINDINGS: CTA CHEST FINDINGS Cardiovascular: Noncontrast images of the chest demonstrate coronary artery, aortic valve, and aortic calcifications. No intramural hematoma. Images obtained during arterial phase after contrast administration demonstrate normal caliber thoracic aorta. No aortic dissection. Great vessel origins are patent. Normal heart size. No pericardial effusions. Mediastinum/Nodes: No enlarged mediastinal, hilar, or axillary lymph nodes. Thyroid gland, trachea, and esophagus demonstrate no significant findings. Lungs/Pleura: Motion artifact limits the examination. No focal consolidation or volume loss. No pleural effusions. No pneumothorax. Airways are patent. Musculoskeletal: Degenerative changes in the spine. Normal alignment. No destructive bone lesions. Review of the MIP images confirms the above findings. CTA ABDOMEN AND PELVIS FINDINGS VASCULAR Aorta: Normal caliber aorta without aneurysm, dissection, vasculitis or significant stenosis. Aortic calcifications. Celiac: Patent without evidence of aneurysm, dissection, vasculitis or significant stenosis. SMA: Patent without evidence of aneurysm, dissection, vasculitis or significant stenosis. Renals: Duplicated right renal artery. Bilateral renal arteries are patent without evidence of aneurysm, dissection, vasculitis, fibromuscular dysplasia or significant stenosis. IMA: Patent without evidence of aneurysm, dissection, vasculitis or significant stenosis. Inflow: Patent without evidence of aneurysm, dissection, vasculitis or significant stenosis. Veins: No obvious  venous abnormality within the limitations of this arterial phase study. Review of the MIP images confirms the above findings. NON-VASCULAR Hepatobiliary: Mild diffuse fatty infiltration of the liver. No focal liver lesions. Gallbladder and bile ducts are unremarkable. Pancreas: Unremarkable. No pancreatic ductal dilatation or surrounding inflammatory changes. Spleen: Normal in size without focal abnormality. Adrenals/Urinary Tract: Adrenal glands are unremarkable. Kidneys are normal, without renal calculi, focal lesion, or hydronephrosis. Bladder is unremarkable. Stomach/Bowel: Stomach is within normal limits. Appendix appears normal. No evidence of bowel wall thickening, distention, or inflammatory changes. Lymphatic: No significant vascular findings are present. No enlarged abdominal or pelvic lymph nodes. Reproductive: Prostate is unremarkable. Other: No abdominal wall hernia or abnormality. No abdominopelvic ascites. Musculoskeletal: Degenerative changes in the spine. No destructive bone lesions. Schmorl's nodes. Review of the MIP images confirms the above findings. IMPRESSION: No evidence of aneurysm or dissection of the thoracic or abdominal aorta. Aortic atherosclerosis. No acute process demonstrated in the chest, abdomen, or pelvis. Electronically Signed   By: Lucienne Capers M.D.   On: 04/23/2016 04:49      ASSESSMENT AND PLAN:   1. NSTEMI 2. Hypertriglyceridemia  3. Obesity 4.    Hypertension   The patient presents with a  non-STEMI with a history of antecedent chest pain and multiple cardiac risk factors. We will treat him with aspirin and heparin, beta blockers and statins. We anticipate catheterization on Monday.   Drenda Freeze 04/23/2016 3:22 PM  Pager # [phone]

## 2016-04-23 NOTE — ED Notes (Addendum)
Spoke with Dr Smith Mince to see if Dr Cleda Mccreedy would see in ED Or who who would be accepting in ED. Dr Smith Mince to follow up w me

## 2016-04-23 NOTE — ED Notes (Signed)
EDP just notified this RN that this CODE STEMI has been cancelled.

## 2016-04-23 NOTE — ED Notes (Signed)
Repaged Dr Loleta Books. EDP Dr Tomi Bamberger aware of troponin

## 2016-04-23 NOTE — ED Notes (Signed)
Dr Dessa Phi called to update. Pt will be transferred to cardiology services of Dr Cleda Mccreedy

## 2016-04-23 NOTE — ED Notes (Signed)
Paged hospitalist Dr Smith Mince and  updated on pt. Hospitalist will call me back with further instructions

## 2016-04-23 NOTE — ED Notes (Signed)
Pt updated on disposition

## 2016-04-23 NOTE — ED Notes (Signed)
Pt becoming very frustrated about the length of time to obtain bed assignment. Stating he is going to walk out of here. Apologized for wait and assured pt that we are working on getting him transferred.

## 2016-04-23 NOTE — ED Notes (Signed)
Called [facility] states dr Loleta Books is on call for X, NIU . Paged Dr Loleta Books

## 2016-04-23 NOTE — ED Notes (Signed)
Pt called out stating he was going to "throw up."

## 2016-04-23 NOTE — ED Notes (Signed)
Updated EDP Dr Lacinda Axon on patients status

## 2016-04-23 NOTE — ED Notes (Signed)
CRITICAL VALUE ALERT  Critical value received:  Troponin 0.10  Date of notification:  04/23/2016  Time of notification:  [id number]  Critical value read back:Yes.    Nurse who received alert:  Charlies Silvers  MD notified (1st page):  Tomi Bamberger  Time of first page:  [phone]  MD notified (2nd page):  Time of second page:  Responding MD:  Tomi Bamberger  Time MD responded:  [phone]

## 2016-04-23 NOTE — Progress Notes (Signed)
Latest troponin-4.98. Md made aware with no order. Continue to monitor.

## 2016-04-23 NOTE — ED Provider Notes (Signed)
[facility] DEPT Provider Note   CSN: [id number] Arrival date & time: 04/23/16  0310  Time seen 03:30 AM   History   Chief Complaint Chief Complaint  Patient presents with  . Chest Pain    HPI Albert Carter is a 71 y.o. male.  HPI  patient states for the past month  he's been having pain in his upper back between his shoulder blades and last several days or can last a lot shorter. He reports that 1 AM this morning he started having left-sided chest pain and the upper back pain together. He describes the chest pain is sharp and heavy. He states he was diaphoretic and felt short of breath. He states he felt weak and felt like he was going to pass out. He denies nausea or vomiting. He states nothing he did make the pain worse, nothing he did make the pain better. He reports he had a cardiac catheterization about 25 years ago that was normal.  PCP Hilma Favors, Betsy Coder, MD   Past Medical History:  Diagnosis Date  . Asthma   . Bronchitis     Patient Active Problem List   Diagnosis Date Noted  . NSTEMI (non-ST elevated myocardial infarction) ([place]) 04/23/2016    Past Surgical History:  Procedure Laterality Date  . COLONOSCOPY  03/24/2011   Procedure: COLONOSCOPY;  Surgeon: Daneil Dolin, MD;  [facility];  Service: Endoscopy;  Laterality: N/A;  11:30 AM  . FACIAL FRACTURE SURGERY  1973   [facility]  . [place]   broke jaw and reset       Home Medications    Prior to Admission medications   Medication Sig Start Date End Date Taking? Authorizing Provider  acetaminophen (TYLENOL) 325 MG tablet Take 650 mg by mouth every 6 (six) hours as needed.    Historical Provider, MD  traMADol (ULTRAM) 50 MG tablet Take 1 tablet (50 mg total) by mouth every 6 (six) hours as needed. 03/20/16   [place], DO    Family History History reviewed. No pertinent family history.  Social History Social History  Substance Use Topics  . Smoking status: Never Smoker   . Smokeless tobacco: Never Used  . Alcohol use No  lives at home Lives with wife, son, DIL and children   Allergies   Codeine   Review of Systems Review of Systems  All other systems reviewed and are negative.    Physical Exam Updated Vital Signs BP (!) 201/93 (BP Location: Left Arm)   Pulse 60   Temp 97.5 F (36.4 C) (Oral)   Resp 16   Ht 5\' 8"  (1.727 m)   Wt 259 lb (117.5 kg)   SpO2 97%   BMI 39.38 kg/m   Vital signs normal except for hypertension and bradycardia   Physical Exam  Constitutional: He is oriented to person, place, and time. He appears well-developed and well-nourished.  Non-toxic appearance. He does not appear ill. No distress.  HENT:  Head: Normocephalic and atraumatic.  Right Ear: External ear normal.  Left Ear: External ear normal.  Nose: Nose normal. No mucosal edema or rhinorrhea.  Mouth/Throat: Oropharynx is clear and moist and mucous membranes are normal. No dental abscesses or uvula swelling.  Eyes: Conjunctivae and EOM are normal. Pupils are equal, round, and reactive to light.  Neck: Normal range of motion and full passive range of motion without pain. Neck supple.  Cardiovascular: Normal rate, regular rhythm and normal heart sounds.  Exam reveals no gallop and no friction rub.   No murmur heard. Equal radial pulses  Pulmonary/Chest: Effort normal and breath sounds normal. No respiratory distress. He has no wheezes. He has no rhonchi. He has no rales. He exhibits no tenderness and no crepitus.  Abdominal: Soft. Normal appearance and bowel sounds are normal. He exhibits no distension. There is no tenderness. There is no rebound and no guarding.  Musculoskeletal: Normal range of motion. He exhibits no edema or tenderness.  Moves all extremities well.   Neurological: He is alert and oriented to person, place, and time. He has normal strength. No cranial nerve deficit.  Skin: Skin is warm, dry and intact. No rash noted. No erythema. No pallor.   Psychiatric: He has a normal mood and affect. His speech is normal and behavior is normal. His mood appears not anxious.  Nursing note and vitals reviewed.    ED Treatments / Results  Labs (all labs ordered are listed, but only abnormal results are displayed)   EKG ED ECG REPORT   Date: 04/23/2016  Rate: 59  Rhythm: sinus bradycardia  QRS Axis: normal  Intervals: normal  ST/T Wave abnormalities: new 1 mm ST elevation infero-lateral leads  Conduction Disutrbances:none  Narrative Interpretation:   Old EKG Reviewed: changes noted from 2003   I have personally reviewed the EKG tracing and agree with the computerized printout as noted.        # 2  ED ECG REPORT   Date: 04/23/2016  Rate: 52  Rhythm: sinus bradycardia  QRS Axis: normal  Intervals: normal  ST/T Wave abnormalities: ST elevation gone in inferolateral leads  Conduction Disutrbances:none  Narrative Interpretation:   Old EKG Reviewed: unchanged from EKG earlier this morning  I have personally reviewed the EKG tracing and agree with the computerized printout as noted.      Radiology Dg Chest Port 1 View  Result Date: 04/23/2016 CLINICAL DATA:  Upper back pain, chest pain, hypertension. EXAM: PORTABLE CHEST 1 VIEW COMPARISON:  05/01/2015 FINDINGS: Shallow inspiration. Mild cardiac enlargement. No pulmonary vascular congestion or edema. No focal consolidation. No blunting of costophrenic angles. No pneumothorax. Mediastinal contours appear intact. IMPRESSION: No active disease. Electronically Signed   By: Lucienne Capers M.D.   On: 04/23/2016 04:07   Ct Angio Chest/abd/pel For Dissection W And/or W/wo  Result Date: 04/23/2016 CLINICAL DATA:  Pain between the shoulders for 1 month. Chest pain radiating down both arms beginning yesterday. Hypertension. EXAM: CT ANGIOGRAPHY CHEST, ABDOMEN AND PELVIS TECHNIQUE: Multidetector CT imaging through the chest, abdomen and pelvis was performed using the standard  protocol during bolus administration of intravenous contrast. Multiplanar reconstructed images and MIPs were obtained and reviewed to evaluate the vascular anatomy. CONTRAST:  100 mL Isovue 370 COMPARISON:  None. FINDINGS: CTA CHEST FINDINGS Cardiovascular: Noncontrast images of the chest demonstrate coronary artery, aortic valve, and aortic calcifications. No intramural hematoma. Images obtained during arterial phase after contrast administration demonstrate normal caliber thoracic aorta. No aortic dissection. Great vessel origins are patent. Normal heart size. No pericardial effusions. Mediastinum/Nodes: No enlarged mediastinal, hilar, or axillary lymph nodes. Thyroid gland, trachea, and esophagus demonstrate no significant findings. Lungs/Pleura: Motion artifact limits the examination. No focal consolidation or volume loss. No pleural effusions. No pneumothorax. Airways are patent. Musculoskeletal: Degenerative changes in the spine. Normal alignment. No destructive bone lesions. Review of the MIP images confirms the above findings. CTA ABDOMEN AND PELVIS FINDINGS VASCULAR Aorta: Normal caliber aorta without aneurysm, dissection, vasculitis or significant stenosis.  Aortic calcifications. Celiac: Patent without evidence of aneurysm, dissection, vasculitis or significant stenosis. SMA: Patent without evidence of aneurysm, dissection, vasculitis or significant stenosis. Renals: Duplicated right renal artery. Bilateral renal arteries are patent without evidence of aneurysm, dissection, vasculitis, fibromuscular dysplasia or significant stenosis. IMA: Patent without evidence of aneurysm, dissection, vasculitis or significant stenosis. Inflow: Patent without evidence of aneurysm, dissection, vasculitis or significant stenosis. Veins: No obvious venous abnormality within the limitations of this arterial phase study. Review of the MIP images confirms the above findings. NON-VASCULAR Hepatobiliary: Mild diffuse fatty  infiltration of the liver. No focal liver lesions. Gallbladder and bile ducts are unremarkable. Pancreas: Unremarkable. No pancreatic ductal dilatation or surrounding inflammatory changes. Spleen: Normal in size without focal abnormality. Adrenals/Urinary Tract: Adrenal glands are unremarkable. Kidneys are normal, without renal calculi, focal lesion, or hydronephrosis. Bladder is unremarkable. Stomach/Bowel: Stomach is within normal limits. Appendix appears normal. No evidence of bowel wall thickening, distention, or inflammatory changes. Lymphatic: No significant vascular findings are present. No enlarged abdominal or pelvic lymph nodes. Reproductive: Prostate is unremarkable. Other: No abdominal wall hernia or abnormality. No abdominopelvic ascites. Musculoskeletal: Degenerative changes in the spine. No destructive bone lesions. Schmorl's nodes. Review of the MIP images confirms the above findings. IMPRESSION: No evidence of aneurysm or dissection of the thoracic or abdominal aorta. Aortic atherosclerosis. No acute process demonstrated in the chest, abdomen, or pelvis. Electronically Signed   By: Lucienne Capers M.D.   On: 04/23/2016 04:49    Procedures Procedures (including critical care time)  CRITICAL CARE Performed by: Brianah Hopson L Damonte Frieson Total critical care time: 48 minutes Critical care time was exclusive of separately billable procedures and treating other patients. Critical care was necessary to treat or prevent imminent or life-threatening deterioration. Critical care was time spent personally by me on the following activities: development of treatment plan with patient and/or surrogate as well as nursing, discussions with consultants, evaluation of patient's response to treatment, examination of patient, obtaining history from patient or surrogate, ordering and performing treatments and interventions, ordering and review of laboratory studies, ordering and review of radiographic studies, pulse  oximetry and re-evaluation of patient's condition.   Medications Ordered in ED Medications  0.9 %  sodium chloride infusion (20 mL/hr Intravenous New Bag/Given 04/23/16 0400)  iopamidol (ISOVUE-370) 76 % injection (  Canceled Entry 04/23/16 0415)  heparin injection 4,000 Units (not administered)  heparin ADULT infusion 100 units/mL (25000 units/25mL sodium chloride 0.45%) (10 Units/kg/hr  117.5 kg Intravenous New Bag/Given 04/23/16 0550)  nitroGLYCERIN 50 mg in dextrose 5 % 250 mL (0.2 mg/mL) infusion (10 mcg/min Intravenous Rate/Dose Change 04/23/16 0412)  morphine 4 MG/ML injection 4 mg (4 mg Intravenous Given 04/23/16 0348)  iopamidol (ISOVUE-370) 76 % injection 100 mL (100 mLs Intravenous Contrast Given 04/23/16 0424)  aspirin chewable tablet 324 mg (324 mg Oral Given 04/23/16 0530)     Initial Impression / Assessment and Plan / ED Course  I have reviewed the triage vital signs and the nursing notes.  Pertinent labs & imaging results that were available during my care of the patient were reviewed by me and considered in my medical decision making (see chart for details).  Pt has very mild ST elevation on his EKG, however it does not have the classic ischemic look to it. He is c/o a lot of upper back pain this week and he is hypertensive. I decided to call [facility] to talk to cardiology, but also ordered CTA to look for thoracic dissection. He  was started on nitroglycerin drip and given IV morphine for pain.    03:48 [facility]-discussed with Dr Claiborne Billings, will wait for CTA to be done.   After reviewing his CTA he was started on aspirin and IV heparin bolus and drip. Pt states his pain is much improved, but not gone.  He still has a lot of back pain.   05:16 AM discussed with Dr Claiborne Billings, repeat EKG shows improved ST elevation which was minor before. Patient's pain is improved on medication. He feels medicine can admit and consult cardiology.   05:31 AM Dr Darrick Meigs will see patient for  admission  05:58 Dr Darrick Meigs states Dr Blaine Hamper will accept for admission at [facility]. Dr Darrick Meigs will do admission orders  Final Clinical Impressions(s) / ED Diagnoses   Final diagnoses:  NSTEMI (non-ST elevated myocardial infarction) [facility])  Hypertensive urgency    Plan transfer to [facility] for admission  Rolland Porter, MD, Barbette Or, MD 04/23/16 [phone]

## 2016-04-23 NOTE — ED Notes (Addendum)
Spoke with Dr Smith Mince and updated. Pt [facility] in [facility]. If any change in condition will notify Dr Smith Mince. Pt to remain NPO

## 2016-04-23 NOTE — ED Notes (Signed)
Pt is on [facility]'s stretcher at this time.

## 2016-04-23 NOTE — ED Notes (Signed)
Paged Dr Smith Mince to update on elevated troponin. Awaiting return call

## 2016-04-23 NOTE — H&P (Addendum)
TRH H&P    Patient Demographics:    Albert Carter, is a 71 y.o. male  MRN: [id number]  DOB - Dec 24, 1945  Admit Date - 04/23/2016  Referring MD/NP/PA: Dr. Tomi Bamberger  Outpatient Primary MD for the patient is Purvis Kilts, MD  Patient coming from: Home  Chief Complaint  Patient presents with  . Chest Pain      HPI:    Albert Carter  is a 71 y.o. male, With history of asthma came to hospital with pain in upper back between shoulder blades for last several days. Patient says that he has had this pain for a while but became worse last night and also involved chest,  Arms. He denies shortness of breath. No nausea vomiting or diarrhea. Patient vomited once after he received morphine in the ED. He denies fever or dysuria. Patient denies recent injury, but says that last year he fell backwards hitting his neck and upper back. At that time CT cervical spine and thoracic spine was done which showed cervical spondylosis, thoracic spondylosis and  DISH- diffuse idiopathic skeletal hyperostosis.  Today in the ED patient was found to be hypertensive, with elevated troponin 0.10. CTA was negative for aortic dissection or pulmonary embolism. Dr. Tomi Bamberger discussed with Dr. Claiborne Billings at [facility], and patient was started on heparin and nitroglycerin glycerin drip. As per cardiology patient can be admitted to medicine service.    Review of systems:    In addition to the HPI above,  No Fever-chills, No Headache, No changes with Vision or hearing, No problems swallowing food No Abdominal pain,  bowel movements are regular, No Blood in stool or Urine, No dysuria, No new skin rashes or bruises, No new joints pains-aches,  No recent weight gain or loss, No polyuria, polydypsia or polyphagia, No significant Mental Stressors.  A full 10 point Review of Systems was done, except as stated above, all other Review of  Systems were negative.   With Past History of the following :    Past Medical History:  Diagnosis Date  . Asthma   . Bronchitis       Past Surgical History:  Procedure Laterality Date  . COLONOSCOPY  03/24/2011   Procedure: COLONOSCOPY;  Surgeon: Daneil Dolin, MD;  Location: [facility];  Service: Endoscopy;  Laterality: N/A;  11:30 AM  . FACIAL FRACTURE SURGERY  1973   [facility]  . [place]   broke jaw and reset      Social History:      Social History  Substance Use Topics  . Smoking status: Never Smoker  . Smokeless tobacco: Never Used  . Alcohol use No       Family History :   Patient's mother had heart problems   Home Medications:   Prior to Admission medications   Medication Sig Start Date End Date Taking? Authorizing Provider  acetaminophen (TYLENOL) 325 MG tablet Take 650 mg by mouth every 6 (six) hours as needed.    Historical Provider, MD  traMADol (ULTRAM) 50 MG  tablet Take 1 tablet (50 mg total) by mouth every 6 (six) hours as needed. 03/20/16   Delice Bison Ward, DO     Allergies:     Allergies  Allergen Reactions  . Codeine Other (See Comments)    MAKES HIM SLEEP ALL TIME     Physical Exam:   Vitals  Blood pressure 159/71, pulse (!) 58, temperature 97.5 F (36.4 C), temperature source Oral, resp. rate 15, height 5\' 8"  (1.727 m), weight 117.5 kg (259 lb), SpO2 96 %.  1.  General: Appears in no acute distress  2. Psychiatric:  Intact judgement and  insight, awake alert, oriented x 3.  3. Neurologic: No focal neurological deficits, all cranial nerves intact.Strength 5/5 all 4 extremities, sensation intact all 4 extremities, plantars down going.  4. Eyes :  anicteric sclerae, moist conjunctivae with no lid lag. PERRLA.  5. ENMT:  Oropharynx clear with moist mucous membranes and good dentition  6. Neck:  supple, no cervical lymphadenopathy appriciated, No thyromegaly  7. Respiratory : Normal respiratory effort, good air  movement bilaterally,clear to  auscultation bilaterally  8. Cardiovascular : RRR, no gallops, rubs or murmurs, no leg edema  9. Gastrointestinal:  Positive bowel sounds, abdomen soft, non-tender to palpation,no hepatosplenomegaly, no rigidity or guarding       10. Skin:  No cyanosis, normal texture and turgor, no rash, lesions or ulcers  11.Musculoskeletal:  Good muscle tone,  joints appear normal , no effusions,  normal range of motion    Data Review:    CBC  Recent Labs Lab 04/23/16 0340 04/23/16 0419  WBC 7.7  --   HGB 15.6 16.0  HCT 45.4 47.0  PLT 202  --   MCV 94.2  --   MCH 32.4  --   MCHC 34.4  --   RDW 14.5  --   LYMPHSABS 3.1  --   MONOABS 0.6  --   EOSABS 0.3  --   BASOSABS 0.0  --    ------------------------------------------------------------------------------------------------------------------  Chemistries   Recent Labs Lab 04/23/16 0340 04/23/16 0419  NA 135 139  K 3.6 3.7  CL 103 104  CO2 22  --   GLUCOSE 148* 149*  BUN 25* 28*  CREATININE 0.89 1.00  CALCIUM 9.2  --   AST 20  --   ALT 21  --   ALKPHOS 47  --   BILITOT 0.6  --    ------------------------------------------------------------------------------------------------------------------  ------------------------------------------------------------------------------------------------------------------ GFR: Estimated Creatinine Clearance: 85.6 mL/min (by C-G formula based on SCr of 1 mg/dL). Liver Function Tests:  Recent Labs Lab 04/23/16 0340  AST 20  ALT 21  ALKPHOS 47  BILITOT 0.6  PROT 7.4  ALBUMIN 4.0   No results for input(s): LIPASE, AMYLASE in the last 168 hours. No results for input(s): AMMONIA in the last 168 hours. Coagulation Profile:  Recent Labs Lab 04/23/16 0340  INR 1.00   Cardiac Enzymes:  Recent Labs Lab 04/23/16 0340  TROPONINI 0.10*   BNP (last 3 results) No results for input(s): PROBNP in the last 8760 hours. HbA1C: No results for  input(s): HGBA1C in the last 72 hours. CBG: No results for input(s): GLUCAP in the last 168 hours. Lipid Profile:  Recent Labs  04/23/16 0340  CHOL 202*  HDL 37*  LDLCALC 98  TRIG 337*  CHOLHDL 5.5   Thyroid Function Tests: No results for input(s): TSH, T4TOTAL, FREET4, T3FREE, THYROIDAB in the last 72 hours. Anemia Panel: No results for input(s): VITAMINB12, FOLATE, FERRITIN,  TIBC, IRON, RETICCTPCT in the last 72 hours.  --------------------------------------------------------------------------------------------------------------- Urine analysis:    Component Value Date/Time   COLORURINE YELLOW 07/03/2013 0049   APPEARANCEUR CLEAR 07/03/2013 0049   LABSPEC 1.025 07/03/2013 0049   PHURINE 6.0 07/03/2013 0049   GLUCOSEU NEGATIVE 07/03/2013 0049   HGBUR NEGATIVE 07/03/2013 0049   BILIRUBINUR NEGATIVE 07/03/2013 0049   KETONESUR NEGATIVE 07/03/2013 0049   PROTEINUR NEGATIVE 07/03/2013 0049   UROBILINOGEN 1.0 07/03/2013 0049   NITRITE NEGATIVE 07/03/2013 0049   LEUKOCYTESUR NEGATIVE 07/03/2013 0049      Imaging Results:    Dg Chest Port 1 View  Result Date: 04/23/2016 CLINICAL DATA:  Upper back pain, chest pain, hypertension. EXAM: PORTABLE CHEST 1 VIEW COMPARISON:  05/01/2015 FINDINGS: Shallow inspiration. Mild cardiac enlargement. No pulmonary vascular congestion or edema. No focal consolidation. No blunting of costophrenic angles. No pneumothorax. Mediastinal contours appear intact. IMPRESSION: No active disease. Electronically Signed   By: Lucienne Capers M.D.   On: 04/23/2016 04:07   Ct Angio Chest/abd/pel For Dissection W And/or W/wo  Result Date: 04/23/2016 CLINICAL DATA:  Pain between the shoulders for 1 month. Chest pain radiating down both arms beginning yesterday. Hypertension. EXAM: CT ANGIOGRAPHY CHEST, ABDOMEN AND PELVIS TECHNIQUE: Multidetector CT imaging through the chest, abdomen and pelvis was performed using the standard protocol during bolus  administration of intravenous contrast. Multiplanar reconstructed images and MIPs were obtained and reviewed to evaluate the vascular anatomy. CONTRAST:  100 mL Isovue 370 COMPARISON:  None. FINDINGS: CTA CHEST FINDINGS Cardiovascular: Noncontrast images of the chest demonstrate coronary artery, aortic valve, and aortic calcifications. No intramural hematoma. Images obtained during arterial phase after contrast administration demonstrate normal caliber thoracic aorta. No aortic dissection. Great vessel origins are patent. Normal heart size. No pericardial effusions. Mediastinum/Nodes: No enlarged mediastinal, hilar, or axillary lymph nodes. Thyroid gland, trachea, and esophagus demonstrate no significant findings. Lungs/Pleura: Motion artifact limits the examination. No focal consolidation or volume loss. No pleural effusions. No pneumothorax. Airways are patent. Musculoskeletal: Degenerative changes in the spine. Normal alignment. No destructive bone lesions. Review of the MIP images confirms the above findings. CTA ABDOMEN AND PELVIS FINDINGS VASCULAR Aorta: Normal caliber aorta without aneurysm, dissection, vasculitis or significant stenosis. Aortic calcifications. Celiac: Patent without evidence of aneurysm, dissection, vasculitis or significant stenosis. SMA: Patent without evidence of aneurysm, dissection, vasculitis or significant stenosis. Renals: Duplicated right renal artery. Bilateral renal arteries are patent without evidence of aneurysm, dissection, vasculitis, fibromuscular dysplasia or significant stenosis. IMA: Patent without evidence of aneurysm, dissection, vasculitis or significant stenosis. Inflow: Patent without evidence of aneurysm, dissection, vasculitis or significant stenosis. Veins: No obvious venous abnormality within the limitations of this arterial phase study. Review of the MIP images confirms the above findings. NON-VASCULAR Hepatobiliary: Mild diffuse fatty infiltration of the liver.  No focal liver lesions. Gallbladder and bile ducts are unremarkable. Pancreas: Unremarkable. No pancreatic ductal dilatation or surrounding inflammatory changes. Spleen: Normal in size without focal abnormality. Adrenals/Urinary Tract: Adrenal glands are unremarkable. Kidneys are normal, without renal calculi, focal lesion, or hydronephrosis. Bladder is unremarkable. Stomach/Bowel: Stomach is within normal limits. Appendix appears normal. No evidence of bowel wall thickening, distention, or inflammatory changes. Lymphatic: No significant vascular findings are present. No enlarged abdominal or pelvic lymph nodes. Reproductive: Prostate is unremarkable. Other: No abdominal wall hernia or abnormality. No abdominopelvic ascites. Musculoskeletal: Degenerative changes in the spine. No destructive bone lesions. Schmorl's nodes. Review of the MIP images confirms the above findings. IMPRESSION: No evidence of aneurysm or dissection  of the thoracic or abdominal aorta. Aortic atherosclerosis. No acute process demonstrated in the chest, abdomen, or pelvis. Electronically Signed   By: Lucienne Capers M.D.   On: 04/23/2016 04:49    My personal review of EKG: Rhythm NSR   Assessment & Plan:    Active Problems:   NSTEMI (non-ST elevated myocardial infarction) ([place])   1. Acute coronary syndrome- patient presented with chest pain, hypertensive urgency, has elevation of troponin 0.10. Cardiology Dr. Claiborne Billings reviewed the EKG and did not feel that patient had STEMI, he will be admitted to medicine service. Will need to formally consult cardiology when patient reaches [facility]. We will continue with heparin protocol, nitroglycerin infusion. Continue aspirin 325 mg by mouth daily.  2. Upper back pain- chronic, patient had CT thorax on 03/20/2016 which showed thoracic spondylosis and DISH-diffuse idiopathic skeletal hyperostosis. This is the likely cause of patient's chronic upper back pain. Consider physical therapy  as outpatient once patient is medically stable. 3. Hypertensive urgency-patient came with hypertensive urgency, currently on nitroglycerin drip. He needs to be on antihypertensive medications before discharge.    DVT Prophylaxis-   heparin   AM Labs Ordered, also please review Full Orders  Family Communication: Admission, patients condition and plan of care including tests being ordered have been discussed with the patient and his son at bedside who indicate understanding and agree with the plan and Code Status.  Code Status:  Full code  Admission status: Inpatient  Time spent in minutes : 60 minutes   Lesean Woolverton S M.D on 04/23/2016 at 6:12 AM  Between 7am to 7pm - Pager - [phone]. After 7pm go to [email or web address] - password [facility]  [facility] - Office  [phone]

## 2016-04-23 NOTE — Progress Notes (Signed)
ANTICOAGULATION CONSULT NOTE - Follow Up Consult  Pharmacy Consult for Heparin Indication: chest pain/ACS  Allergies  Allergen Reactions  . Codeine Other (See Comments)    MAKES HIM SLEEP ALL TIME    Patient Measurements: Height: 5\' 8"  (172.7 cm) Weight: 255 lb 15.3 oz (116.1 kg) IBW/kg (Calculated) : 68.4 HEPARIN DW (KG): 94.7  Vital Signs: Temp: 98.1 F (36.7 C) (02/10 1256) BP: 132/79 (02/10 1245) Pulse Rate: 62 (02/10 1245)  Labs:  Recent Labs  04/23/16 0340 04/23/16 0419 04/23/16 0655 04/23/16 0947 04/23/16 1424  HGB 15.6 16.0  --   --   --   HCT 45.4 47.0  --   --   --   PLT 202  --   --   --   --   APTT 33  --   --   --   --   LABPROT 13.2  --   --   --   --   INR 1.00  --   --   --   --   HEPARINUNFRC  --   --   --   --  0.10*  CREATININE 0.89 1.00  --   --   --   TROPONINI 0.10*  --  1.07* 2.57*  --     Estimated Creatinine Clearance: 85.1 mL/min (by C-G formula based on SCr of 1 mg/dL).   Medical History: Past Medical History:  Diagnosis Date  . Asthma   . Bronchitis     Medications:  See med rec  Assessment: 70 yo male presented to [facility] with chest pain. Troponins trending up. EKG showed NSTEMI.  Heparin, ntroglycerin and aspirin and transfer to [facility].  Heparin drip 1200 uts/hr (no bolus) HL 0.1 - less than goal.  Will increase   Goal of Therapy:  Heparin level 0.3-0.7 units/ml Monitor platelets by anticoagulation protocol: Yes   Plan:  Increase heparin infusion 1400  Units/hr Heparin bolus 4000 uts IV x1 - not given earlier Check anti-Xa level in 6 hours and daily while on heparin Continue to monitor H&H and platelets  Bonnita Nasuti Pharm.D. CPP, BCPS Clinical Pharmacist [phone] 04/23/2016 3:44 PM

## 2016-04-23 NOTE — ED Notes (Signed)
Dr. Darrick Meigs made aware that this pt had a run a V-tach with a rate of 120.

## 2016-04-23 NOTE — ED Notes (Addendum)
Updated Dr Smith Mince that no beds available and current troponin. Per Smith Mince per Dr Jens Som to send to ED if no beds available

## 2016-04-23 NOTE — ED Notes (Signed)
Called [facility] for update on bed. No bed available

## 2016-04-23 NOTE — ED Notes (Signed)
Paged dr Smith Mince for update. She states waiting on call from cardiology

## 2016-04-24 ENCOUNTER — Encounter (HOSPITAL_COMMUNITY): Payer: Self-pay | Admitting: Internal Medicine

## 2016-04-24 LAB — CBC
HCT: 42.9 % (ref 39.0–52.0)
Hemoglobin: 14.3 g/dL (ref 13.0–17.0)
MCH: 31.2 pg (ref 26.0–34.0)
MCHC: 33.3 g/dL (ref 30.0–36.0)
MCV: 93.5 fL (ref 78.0–100.0)
PLATELETS: 198 10*3/uL (ref 150–400)
RBC: 4.59 MIL/uL (ref 4.22–5.81)
RDW: 14.5 % (ref 11.5–15.5)
WBC: 10.5 10*3/uL (ref 4.0–10.5)

## 2016-04-24 LAB — TROPONIN I
TROPONIN I: 6.01 ng/mL — AB (ref <0.03)
TROPONIN I: 2.86 ng/mL — AB (ref <0.03)
Troponin I: 3.67 ng/mL (ref <0.03)

## 2016-04-24 LAB — HEPARIN LEVEL (UNFRACTIONATED): HEPARIN UNFRACTIONATED: 0.3 [IU]/mL (ref 0.30–0.70)

## 2016-04-24 LAB — COMPREHENSIVE METABOLIC PANEL
ALT: 23 U/L (ref 17–63)
ANION GAP: 11 (ref 5–15)
AST: 65 U/L — ABNORMAL HIGH (ref 15–41)
Albumin: 3.4 g/dL — ABNORMAL LOW (ref 3.5–5.0)
Alkaline Phosphatase: 43 U/L (ref 38–126)
BILIRUBIN TOTAL: 0.6 mg/dL (ref 0.3–1.2)
BUN: 13 mg/dL (ref 6–20)
CALCIUM: 8.9 mg/dL (ref 8.9–10.3)
CO2: 23 mmol/L (ref 22–32)
Chloride: 98 mmol/L — ABNORMAL LOW (ref 101–111)
Creatinine, Ser: 0.81 mg/dL (ref 0.61–1.24)
GFR calc Af Amer: >60 mL/min (ref >60)
Glucose, Bld: 180 mg/dL — ABNORMAL HIGH (ref 65–99)
Potassium: 3.8 mmol/L (ref 3.5–5.1)
Sodium: 132 mmol/L — ABNORMAL LOW (ref 135–145)
TOTAL PROTEIN: 6.5 g/dL (ref 6.5–8.1)

## 2016-04-24 LAB — HEMOGLOBIN A1C
HEMOGLOBIN A1C: 6 % — AB (ref 4.8–5.6)
Mean Plasma Glucose: 126 mg/dL

## 2016-04-24 LAB — MAGNESIUM: MAGNESIUM: 1.9 mg/dL (ref 1.7–2.4)

## 2016-04-24 MED ORDER — SODIUM CHLORIDE 0.9 % WEIGHT BASED INFUSION
3.0000 mL/kg/h | INTRAVENOUS | Status: DC
  Administered 2016-04-25: 3 mL/kg/h via INTRAVENOUS

## 2016-04-24 MED ORDER — SODIUM CHLORIDE 0.9% FLUSH
3.0000 mL | Freq: Two times a day (BID) | INTRAVENOUS | Status: DC
  Administered 2016-04-24 – 2016-04-25 (×2): 3 mL via INTRAVENOUS

## 2016-04-24 MED ORDER — ASPIRIN 81 MG PO CHEW
81.0000 mg | CHEWABLE_TABLET | ORAL | Status: AC
  Administered 2016-04-25: 81 mg via ORAL
  Filled 2016-04-24: qty 1

## 2016-04-24 MED ORDER — PNEUMOCOCCAL VAC POLYVALENT 25 MCG/0.5ML IJ INJ: 0.5000 mL | INJECTION | INTRAMUSCULAR | Status: DC

## 2016-04-24 MED ORDER — SODIUM CHLORIDE 0.9 % IV SOLN: 250.0000 mL | INTRAVENOUS | Status: DC | PRN

## 2016-04-24 MED ORDER — SODIUM CHLORIDE 0.9% FLUSH: 3.0000 mL | INTRAVENOUS | Status: DC | PRN

## 2016-04-24 MED ORDER — SODIUM CHLORIDE 0.9 % WEIGHT BASED INFUSION
1.0000 mL/kg/h | INTRAVENOUS | Status: DC
  Administered 2016-04-25: 1 mL/kg/h via INTRAVENOUS

## 2016-04-24 MED ORDER — ASPIRIN 81 MG PO CHEW
81.0000 mg | CHEWABLE_TABLET | Freq: Every day | ORAL | Status: DC
  Administered 2016-04-24 – 2016-04-25 (×2): 81 mg via ORAL
  Filled 2016-04-24 (×2): qty 1

## 2016-04-24 NOTE — Progress Notes (Signed)
ANTICOAGULATION CONSULT NOTE - Follow Up Consult  Pharmacy Consult for Heparin Indication: chest pain/ACS  Allergies  Allergen Reactions  . Codeine Other (See Comments)    MAKES HIM SLEEP ALL TIME    Patient Measurements: Height: 5\' 8"  (172.7 cm) Weight: 258 lb 2.5 oz (117.1 kg) IBW/kg (Calculated) : 68.4 HEPARIN DW (KG): 94.7  Vital Signs: Temp: 97.8 F (36.6 C) (02/11 0729) Temp Source: Oral (02/11 0729) BP: 127/59 (02/11 0729) Pulse Rate: 61 (02/11 0729)  Labs:  Recent Labs  04/23/16 0340 04/23/16 0419  04/23/16 1424 04/23/16 1611 04/23/16 2103 04/24/16 0229  HGB 15.6 16.0  --   --   --   --  14.3  HCT 45.4 47.0  --   --   --   --  42.9  PLT 202  --   --   --   --   --  198  APTT 33  --   --   --   --   --   --   LABPROT 13.2  --   --   --   --   --   --   INR 1.00  --   --   --   --   --   --   HEPARINUNFRC  --   --   --  0.10*  --  0.30 0.30  CREATININE 0.89 1.00  --   --   --   --  0.81  TROPONINI 0.10*  --   < >  --  4.98* 8.24* 6.01*  < > = values in this interval not displayed.  Estimated Creatinine Clearance: 105.5 mL/min (by C-G formula based on SCr of 0.81 mg/dL).   Medical History: Past Medical History:  Diagnosis Date  . Asthma   . Bronchitis     Medications:  See med rec  Assessment: 71 yo male presented to [facility] with chest pain. Troponins trending up peak 8 now back down 6. EKG showed NSTEMI.  Heparin, ntroglycerin and aspirin and transfer to [facility].  Heparin drip 1500 uts/hr HL 0.3 at goal, no bleeding, CBC stable. Plan cath 2/12   Goal of Therapy:  Heparin level 0.3-0.7 units/ml Monitor platelets by anticoagulation protocol: Yes   Plan:  Continue  heparin infusion 1500  Units/hr Check anti-Xa level daily while on heparin Continue to monitor H&H and platelets  Bonnita Nasuti Pharm.D. CPP, BCPS Clinical Pharmacist [phone] 04/24/2016 8:11 AM

## 2016-04-24 NOTE — Progress Notes (Signed)
Patient Name: Albert Carter      SUBJECTIVE: 71 year old male admitted 2/11 with a non-STEMI in the context of crescendo angina and multiple cardiac risk factors. Monitoring notable for nonsustained ventricular tachycardia  Nausea last night. Not had this problem previously    Past Medical History:  Diagnosis Date  . Asthma   . Bronchitis   . NSTEMI (non-ST elevated myocardial infarction) ([place]) 04/23/2016  . Ventricular tachycardia, nonsustained ([place]) 04/23/2016    Scheduled Meds:  Scheduled Meds: . aspirin  81 mg Oral Daily  . atorvastatin  80 mg Oral q1800  . metoprolol tartrate  12.5 mg Oral BID  . pneumococcal 23 valent vaccine  0.5 mL Intramuscular Tomorrow-1000   Continuous Infusions: . sodium chloride 20 mL/hr (04/24/16 0000)  . heparin 1,500 Units/hr (04/24/16 0400)  . nitroGLYCERIN 20 mcg/min (04/24/16 0400)   acetaminophen, hydrALAZINE, nitroGLYCERIN, ondansetron **OR** ondansetron (ZOFRAN) IV, traMADol    PHYSICAL EXAM Vitals:   04/23/16 2300 04/24/16 0300 04/24/16 0400 04/24/16 0729  BP: (!) 157/90 137/80 (!) 104/57 (!) 127/59  Pulse: 69 62 (!) 55 61  Resp: 13 18 13 16   Temp: 97.6 F (36.4 C) 97.5 F (36.4 C)  97.8 F (36.6 C)  TempSrc: Oral Oral  Oral  SpO2: 94% 95% 94% 94%  Weight:  258 lb 2.5 oz (117.1 kg)    Height:        Well developed and nourished in no acute distress HENT normal Neck supple with JVP-flat Clear Regular rate and rhythm, no murmurs or gallops Abd-soft with active BS No Clubbing cyanosis edema Skin-warm and dry A & Oriented  Grossly normal sensory and motor function   TELEMETRY: Reviewed personnally pt in no vt:      Intake/Output Summary (Last 24 hours) at 04/24/16 0847 Last data filed at 04/24/16 0400  Gross per 24 hour  Intake          2098.57 ml  Output             1400 ml  Net           698.57 ml    LABS: Basic Metabolic Panel:  Recent Labs Lab 04/23/16 0340 04/23/16 0419  04/23/16 0655 04/24/16 0229  NA 135 139  --  132*  K 3.6 3.7  --  3.8  CL 103 104  --  98*  CO2 22  --   --  23  GLUCOSE 148* 149*  --  180*  BUN 25* 28*  --  13  CREATININE 0.89 1.00  --  0.81  CALCIUM 9.2  --   --  8.9  MG  --   --  1.4* 1.9   Cardiac Enzymes:  Recent Labs  04/23/16 1611 04/23/16 2103 04/24/16 0229  TROPONINI 4.98* 8.24* 6.01*   CBC:  Recent Labs Lab 04/23/16 0340 04/23/16 0419 04/24/16 0229  WBC 7.7  --  10.5  NEUTROABS 3.7  --   --   HGB 15.6 16.0 14.3  HCT 45.4 47.0 42.9  MCV 94.2  --  93.5  PLT 202  --  198   PROTIME:  Recent Labs  04/23/16 0340  LABPROT 13.2  INR 1.00   Liver Function Tests:  Recent Labs  04/23/16 0340 04/24/16 0229  AST 20 65*  ALT 21 23  ALKPHOS 47 43  BILITOT 0.6 0.6  PROT 7.4 6.5  ALBUMIN 4.0 3.4*   No results for input(s): LIPASE, AMYLASE in the last 72  hours. BNP: BNP (last 3 results) No results for input(s): BNP in the last 8760 hours.  ProBNP (last 3 results) No results for input(s): PROBNP in the last 8760 hours.  D-Dimer: No results for input(s): DDIMER in the last 72 hours. Hemoglobin A1C: No results for input(s): HGBA1C in the last 72 hours. Fasting Lipid Panel:  Recent Labs  04/23/16 0340  CHOL 202*  HDL 37*  LDLCALC 98  TRIG 337*  CHOLHDL 5.5     ASSESSMENT AND PLAN:  Principal Problem:   NSTEMI (non-ST elevated myocardial infarction) [facility]) Active Problems:   Ventricular tachycardia, nonsustained ([facility])   Upper back pain   Hypertensive urgency  Troponin peaked,  With nausea last night will cycle one more time Cath in am  Orders written  ASA325>>81 Hgb A1c pending   Signed, Virl Axe MD  04/24/2016

## 2016-04-24 NOTE — Progress Notes (Signed)
Patient calls and states he feels very nauseous; feels like he is dizzy and "spinning".  States he has never felt this way, wonders if it is due to his medications here.  Pt produces 150ml of light green emesis.  Medicated w/Zofran 4mg  IVP per PRN orders.  VSS.  No other changes in patient assessment.

## 2016-04-25 ENCOUNTER — Inpatient Hospital Stay (HOSPITAL_COMMUNITY): Payer: Medicare Other

## 2016-04-25 ENCOUNTER — Other Ambulatory Visit: Payer: Self-pay | Admitting: *Deleted

## 2016-04-25 ENCOUNTER — Encounter (HOSPITAL_COMMUNITY): Payer: Self-pay | Admitting: Certified Registered Nurse Anesthetist

## 2016-04-25 ENCOUNTER — Encounter (HOSPITAL_COMMUNITY)
Admission: EM | Discharge status: Against Medical Advice | Payer: Self-pay | Source: Home / Self Care | Attending: Internal Medicine

## 2016-04-25 ENCOUNTER — Encounter (HOSPITAL_COMMUNITY): Payer: Self-pay | Admitting: Cardiology

## 2016-04-25 DIAGNOSIS — I2510 Atherosclerotic heart disease of native coronary artery without angina pectoris: Secondary | ICD-10-CM

## 2016-04-25 DIAGNOSIS — Z01810 Encounter for preprocedural cardiovascular examination: Secondary | ICD-10-CM

## 2016-04-25 DIAGNOSIS — E785 Hyperlipidemia, unspecified: Secondary | ICD-10-CM

## 2016-04-25 HISTORY — PX: LEFT HEART CATH AND CORONARY ANGIOGRAPHY: CATH118249

## 2016-04-25 HISTORY — DX: Hyperlipidemia, unspecified: E78.5

## 2016-04-25 LAB — PROTIME-INR
INR: 1.09
PROTHROMBIN TIME: 14.2 s (ref 11.4–15.2)

## 2016-04-25 LAB — CBC
HCT: 40.9 % (ref 39.0–52.0)
HEMOGLOBIN: 13.7 g/dL (ref 13.0–17.0)
MCH: 31.6 pg (ref 26.0–34.0)
MCHC: 33.5 g/dL (ref 30.0–36.0)
MCV: 94.2 fL (ref 78.0–100.0)
Platelets: 198 10*3/uL (ref 150–400)
RBC: 4.34 MIL/uL (ref 4.22–5.81)
RDW: 14.5 % (ref 11.5–15.5)
WBC: 9 10*3/uL (ref 4.0–10.5)

## 2016-04-25 LAB — HEPARIN LEVEL (UNFRACTIONATED)
HEPARIN UNFRACTIONATED: 0.23 [IU]/mL — AB (ref 0.30–0.70)
HEPARIN UNFRACTIONATED: 0.42 [IU]/mL (ref 0.30–0.70)

## 2016-04-25 SURGERY — LEFT HEART CATH AND CORONARY ANGIOGRAPHY
Anesthesia: LOCAL

## 2016-04-25 MED ORDER — FENTANYL CITRATE (PF) 100 MCG/2ML IJ SOLN
INTRAMUSCULAR | Status: AC
  Filled 2016-04-25: qty 2

## 2016-04-25 MED ORDER — ATORVASTATIN CALCIUM 80 MG PO TABS: 80.0000 mg | ORAL_TABLET | Freq: Every day | ORAL | 6 refills | Status: DC

## 2016-04-25 MED ORDER — SODIUM CHLORIDE 0.9 % IV SOLN: INTRAVENOUS | Status: DC

## 2016-04-25 MED ORDER — MIDAZOLAM HCL 2 MG/2ML IJ SOLN
INTRAMUSCULAR | Status: DC | PRN
  Administered 2016-04-25: 2 mg via INTRAVENOUS

## 2016-04-25 MED ORDER — LIDOCAINE HCL (PF) 1 % IJ SOLN
INTRAMUSCULAR | Status: AC
  Filled 2016-04-25: qty 30

## 2016-04-25 MED ORDER — VERAPAMIL HCL 2.5 MG/ML IV SOLN
INTRAVENOUS | Status: DC | PRN
  Administered 2016-04-25: 13:00:00 via INTRA_ARTERIAL

## 2016-04-25 MED ORDER — MIDAZOLAM HCL 2 MG/2ML IJ SOLN
INTRAMUSCULAR | Status: AC
  Filled 2016-04-25: qty 2

## 2016-04-25 MED ORDER — SODIUM CHLORIDE 0.9% FLUSH: 3.0000 mL | INTRAVENOUS | Status: DC | PRN

## 2016-04-25 MED ORDER — IOPAMIDOL (ISOVUE-370) INJECTION 76%
INTRAVENOUS | Status: AC
  Filled 2016-04-25: qty 50

## 2016-04-25 MED ORDER — IOPAMIDOL (ISOVUE-370) INJECTION 76%
INTRAVENOUS | Status: AC
  Filled 2016-04-25: qty 100

## 2016-04-25 MED ORDER — SODIUM CHLORIDE 0.9 % IV SOLN: 250.0000 mL | INTRAVENOUS | Status: DC | PRN

## 2016-04-25 MED ORDER — NITROGLYCERIN IN D5W 200-5 MCG/ML-% IV SOLN: 0.0000 ug/min | INTRAVENOUS | Status: DC

## 2016-04-25 MED ORDER — ATORVASTATIN CALCIUM 80 MG PO TABS: 80.0000 mg | ORAL_TABLET | Freq: Every day | ORAL | Status: DC

## 2016-04-25 MED ORDER — SODIUM CHLORIDE 0.9% FLUSH: 3.0000 mL | Freq: Two times a day (BID) | INTRAVENOUS | Status: DC

## 2016-04-25 MED ORDER — ONDANSETRON HCL 4 MG/2ML IJ SOLN: 4.0000 mg | Freq: Four times a day (QID) | INTRAMUSCULAR | Status: DC | PRN

## 2016-04-25 MED ORDER — ASPIRIN 81 MG PO CHEW: 81.0000 mg | CHEWABLE_TABLET | Freq: Every day | ORAL | Status: DC

## 2016-04-25 MED ORDER — ACETAMINOPHEN 325 MG PO TABS: 650.0000 mg | ORAL_TABLET | ORAL | Status: DC | PRN

## 2016-04-25 MED ORDER — HEPARIN SODIUM (PORCINE) 1000 UNIT/ML IJ SOLN
INTRAMUSCULAR | Status: DC | PRN
  Administered 2016-04-25: 6000 [IU] via INTRAVENOUS

## 2016-04-25 MED ORDER — NITROGLYCERIN 0.4 MG SL SUBL: 0.4000 mg | SUBLINGUAL_TABLET | SUBLINGUAL | 6 refills | Status: DC | PRN

## 2016-04-25 MED ORDER — HEPARIN (PORCINE) IN NACL 2-0.9 UNIT/ML-% IJ SOLN
INTRAMUSCULAR | Status: AC
  Filled 2016-04-25: qty 1000

## 2016-04-25 MED ORDER — IOPAMIDOL (ISOVUE-370) INJECTION 76%
INTRAVENOUS | Status: DC | PRN
  Administered 2016-04-25: 100 mL via INTRA_ARTERIAL

## 2016-04-25 MED ORDER — METOPROLOL TARTRATE 25 MG PO TABS: 12.5000 mg | ORAL_TABLET | Freq: Two times a day (BID) | ORAL | 6 refills | Status: DC

## 2016-04-25 MED ORDER — HEPARIN (PORCINE) IN NACL 100-0.45 UNIT/ML-% IJ SOLN: 1700.0000 [IU]/h | INTRAMUSCULAR | Status: DC

## 2016-04-25 MED ORDER — HEPARIN (PORCINE) IN NACL 2-0.9 UNIT/ML-% IJ SOLN
INTRAMUSCULAR | Status: DC | PRN
  Administered 2016-04-25: 1000 mL via INTRA_ARTERIAL

## 2016-04-25 MED ORDER — ISOSORBIDE MONONITRATE ER 60 MG PO TB24
60.0000 mg | ORAL_TABLET | Freq: Every day | ORAL | Status: DC
  Administered 2016-04-25: 60 mg via ORAL
  Filled 2016-04-25: qty 1

## 2016-04-25 MED ORDER — LIDOCAINE HCL (PF) 1 % IJ SOLN
INTRAMUSCULAR | Status: DC | PRN
  Administered 2016-04-25: 2 mL

## 2016-04-25 MED ORDER — VERAPAMIL HCL 2.5 MG/ML IV SOLN
INTRAVENOUS | Status: AC
  Filled 2016-04-25: qty 2

## 2016-04-25 MED ORDER — ISOSORBIDE MONONITRATE ER 60 MG PO TB24: 60.0000 mg | ORAL_TABLET | Freq: Every day | ORAL | 6 refills | Status: DC

## 2016-04-25 MED ORDER — ASPIRIN 81 MG PO CHEW: 81.0000 mg | CHEWABLE_TABLET | Freq: Every day | ORAL | Status: AC

## 2016-04-25 MED ORDER — FENTANYL CITRATE (PF) 100 MCG/2ML IJ SOLN
INTRAMUSCULAR | Status: DC | PRN
  Administered 2016-04-25: 25 ug via INTRAVENOUS

## 2016-04-25 MED ORDER — HEPARIN SODIUM (PORCINE) 1000 UNIT/ML IJ SOLN
INTRAMUSCULAR | Status: AC
  Filled 2016-04-25: qty 1

## 2016-04-25 SURGICAL SUPPLY — 11 items
CATH EXPO 5FR ANG PIGTAIL 145 (CATHETERS) ×2 IMPLANT
CATH OPTITORQUE TIG 4.0 5F (CATHETERS) ×2 IMPLANT
DEVICE RAD COMP TR BAND LRG (VASCULAR PRODUCTS) ×2 IMPLANT
GLIDESHEATH SLEND SS 6F .021 (SHEATH) ×2 IMPLANT
GUIDEWIRE INQWIRE 1.5J.035X260 (WIRE) ×1 IMPLANT
INQWIRE 1.5J .035X260CM (WIRE) ×2
KIT HEART LEFT (KITS) ×2 IMPLANT
PACK CARDIAC CATHETERIZATION (CUSTOM PROCEDURE TRAY) ×2 IMPLANT
SYR MEDRAD MARK V 150ML (SYRINGE) ×2 IMPLANT
TRANSDUCER W/STOPCOCK (MISCELLANEOUS) ×2 IMPLANT
TUBING CIL FLEX 10 FLL-RA (TUBING) ×2 IMPLANT

## 2016-04-25 NOTE — Progress Notes (Signed)
Pre-op Cardiac Surgery  Carotid Findings:   Findings are consistent with a 1-39 percent stenosis involving the right internal carotid artery.  Findings are consistent with a 74 - 84 percent stenosis involving the left internal carotid artery.  The vertebral arteries demonstrate antegrade flow.  Upper Extremity Right Left  Brachial Pressures 141  Triphasic 155  Triphasic  Radial Waveforms Triphasic Triphasic  Ulnar Waveforms Triphasic Triphasic  Palmar Arch (Allen's Test) Palmar waveforms are obliterated with radial and ulnar compression. Palmar waveforms are decreased greater than fifty percent with radial compression and are obliterated with ulnar compression.    Lower  Extremity Right Left  Dorsalis Pedis 182 182  Posterior Tibial 216 220  Ankle/Brachial Indices 1.39 1.42   Findings:   Right ABI of 1.39 and left ABI of 1.42 are suggestive of falsely elevated pressures likely due to medial calcification of the arteries.

## 2016-04-25 NOTE — Progress Notes (Addendum)
ANTICOAGULATION CONSULT NOTE - Follow Up Consult  Pharmacy Consult for Heparin Indication: chest pain/ACS  Allergies  Allergen Reactions  . Codeine Other (See Comments)    MAKES HIM SLEEP ALL TIME    Patient Measurements: Height: 5\' 8"  (172.7 cm) Weight: 258 lb 12.8 oz (117.4 kg) IBW/kg (Calculated) : 68.4 HEPARIN DW (KG): 94.7  Vital Signs: Temp: 97.8 F (36.6 C) (02/12 1151) Temp Source: Oral (02/12 1151) BP: 126/71 (02/12 0739) Pulse Rate: 58 (02/12 1100)  Labs:  Recent Labs  04/23/16 0340 04/23/16 0419  04/24/16 0229 04/24/16 0809 04/24/16 1443 04/25/16 0155 04/25/16 0912  HGB 15.6 16.0  --  14.3  --   --  13.7  --   HCT 45.4 47.0  --  42.9  --   --  40.9  --   PLT 202  --   --  198  --   --  198  --   APTT 33  --   --   --   --   --   --   --   LABPROT 13.2  --   --   --   --   --   --  14.2  INR 1.00  --   --   --   --   --   --  1.09  HEPARINUNFRC  --   --   < > 0.30  --   --  0.23* 0.42  CREATININE 0.89 1.00  --  0.81  --   --   --   --   TROPONINI 0.10*  --   < > 6.01* 2.86* 3.67*  --   --   < > = values in this interval not displayed.  Estimated Creatinine Clearance: 105.6 mL/min (by C-G formula based on SCr of 0.81 mg/dL).  Assessment: 71 yo male on heparin for NSTEMI. He is now s/p cath to restart heparin 8 hrs post sheath removal (removed at ~ 2pm) -last heparin level was 0.42 on 1700 units/hr  Goal of Therapy:  Heparin level 0.3-0.7 units/ml Monitor platelets by anticoagulation protocol: Yes   Plan:  -Restart heparin at 10pm -Heparin level in 6 hours and daily wth CBC daily  Hildred Laser, Pharm D 04/25/2016 12:57 PM

## 2016-04-25 NOTE — Care Management Note (Signed)
Case Management Note  Patient Details  Name: Albert Carter MRN: [id number] Date of Birth: 1945-08-20  Subjective/Objective:      Adm w nstemi, for cath              Action/Plan:lives at home, pcp dr Hilma Favors   Expected Discharge Date:  04/26/16               Expected Discharge Plan:  Home/Self Care  In-House Referral:     Discharge planning Services     Post Acute Care Choice:    Choice offered to:     DME Arranged:    DME Agency:     HH Arranged:    HH Agency:     Status of Service:  In process, will continue to follow  If discussed at Long Length of Stay Meetings, dates discussed:    Additional Comments:no dc needs anticipated at this time. Lacretia Leigh, RN 04/25/2016, 10:24 AM

## 2016-04-25 NOTE — Significant Event (Signed)
Patient verbalized understanding of risks of leaving the hospital against medical advice. He states now that he will have surgery later on but must go home first to take care of things and talk with the rest of his family. His son has been at the bedside all day, encourages patient to have surgery and remain inpatient. Patient has spoken with his wife via phone. Patient still adamant about going home. Patient also has spoken with Dr. Claiborne Billings via phone just now. Patient still plans to go home tonight. RN has given imdur and has deflated the TR band completely. NTG drip to be discontinue later.     Albert Carter

## 2016-04-25 NOTE — Progress Notes (Signed)
Pt is determined to go home tonight.  He needs to get things arranged if he has surgery.  He is pain free.  Has wrist band in place - he will wait until this comes off.  I offered medication to make comfortable but he did not want.  Family at bedside. I have contacted Dr. Prescott Gum who is still in OR     --pt is aware he could die if he goes home and that is fine.  He stated he may die with surgery.  He is aware that we will need to wean off drips.  He agrees to return if chest pain is bad.

## 2016-04-25 NOTE — Progress Notes (Signed)
Pt. Leaving AMA. Pt. Aware and educated on risks of leaving before doctor order. BP on leave was 152/67. Nitro stopped and IV's taken out. Pt. And pt family given number for cardio and thoracic surgery to call and make another appointment for surgery. PT wheeled out by family.

## 2016-04-25 NOTE — Significant Event (Addendum)
Patient has gotten progressively restless, threatening RN to take out PIVs, drips, and TR Band so that he can leave the hospital. Patient stating he is not having surgery today, but is willing to return to hospital in a few days to have the surgery. Patient not listening to staff explaining and educating on the risks of leaving at this time. Patient's family at bedside. RN paged cardiology on call and spoke with Cecilie Kicks. Per Mickel Baas, that she will call cardiology MD to speak with patient at this time.     Albert Carter

## 2016-04-25 NOTE — Progress Notes (Signed)
Progress Note  Patient Name: Albert Carter Date of Encounter: 04/25/2016  Primary Cardiologist: New/[facility]  Subjective   Feels well today. No further chest pain or nausea.   Inpatient Medications    Scheduled Meds: . aspirin  81 mg Oral Daily  . atorvastatin  80 mg Oral q1800  . metoprolol tartrate  12.5 mg Oral BID  . [START ON 04/26/2016] pneumococcal 23 valent vaccine  0.5 mL Intramuscular Tomorrow-1000  . sodium chloride flush  3 mL Intravenous Q12H   Continuous Infusions: . sodium chloride 20 mL/hr (04/25/16 0400)  . sodium chloride    . heparin 1,700 Units/hr (04/25/16 0400)  . nitroGLYCERIN 20 mcg/min (04/25/16 0400)   PRN Meds: sodium chloride, acetaminophen, hydrALAZINE, nitroGLYCERIN, ondansetron **OR** ondansetron (ZOFRAN) IV, sodium chloride flush, traMADol   Vital Signs    Vitals:   04/24/16 1616 04/24/16 1941 04/24/16 2334 04/25/16 0500  BP: 116/60 138/72 123/62 126/68  Pulse: 62 71 65 (!) 57  Resp: (!) 23 15 15 14   Temp: 98 F (36.7 C) 98.3 F (36.8 C) 98.1 F (36.7 C) 98.1 F (36.7 C)  TempSrc: Oral Oral Oral Oral  SpO2: 95% 93% 94% 96%  Weight:    258 lb 12.8 oz (117.4 kg)  Height:        Intake/Output Summary (Last 24 hours) at 04/25/16 0732 Last data filed at 04/25/16 0400  Gross per 24 hour  Intake           1781.2 ml  Output             2750 ml  Net           -968.8 ml   Filed Weights   04/23/16 1444 04/24/16 0300 04/25/16 0500  Weight: 255 lb 15.3 oz (116.1 kg) 258 lb 2.5 oz (117.1 kg) 258 lb 12.8 oz (117.4 kg)    Telemetry    NSR - Personally Reviewed  ECG    NSR with LAD, anterolateral infarct- evolving since admission with deep T wave inversion and loss of R wave across precordium - Personally Reviewed  Physical Exam   GEN: No acute distress.   Neck: No JVD Cardiac: RRR, no murmurs, rubs, or gallops.  Respiratory: Clear to auscultation bilaterally. GI: Soft, nontender, non-distended  MS: No edema; No  deformity. Neuro:  Nonfocal  Psych: Normal affect   Labs    Chemistry Recent Labs Lab 04/23/16 0340 04/23/16 0419 04/24/16 0229  NA 135 139 132*  K 3.6 3.7 3.8  CL 103 104 98*  CO2 22  --  23  GLUCOSE 148* 149* 180*  BUN 25* 28* 13  CREATININE 0.89 1.00 0.81  CALCIUM 9.2  --  8.9  PROT 7.4  --  6.5  ALBUMIN 4.0  --  3.4*  AST 20  --  65*  ALT 21  --  23  ALKPHOS 47  --  43  BILITOT 0.6  --  0.6  GFRNONAA >60  --  >60  GFRAA >60  --  >60  ANIONGAP 10  --  11     Hematology Recent Labs Lab 04/23/16 0340 04/23/16 0419 04/24/16 0229 04/25/16 0155  WBC 7.7  --  10.5 9.0  RBC 4.82  --  4.59 4.34  HGB 15.6 16.0 14.3 13.7  HCT 45.4 47.0 42.9 40.9  MCV 94.2  --  93.5 94.2  MCH 32.4  --  31.2 31.6  MCHC 34.4  --  33.3 33.5  RDW 14.5  --  14.5 14.5  PLT 202  --  198 198    Cardiac Enzymes Recent Labs Lab 04/23/16 2103 04/24/16 0229 04/24/16 0809 04/24/16 1443  TROPONINI 8.24* 6.01* 2.86* 3.67*   No results for input(s): TROPIPOC in the last 168 hours.   BNPNo results for input(s): BNP, PROBNP in the last 168 hours.   DDimer No results for input(s): DDIMER in the last 168 hours.   Radiology    No results found.  Cardiac Studies   none  Patient Profile     71 y.o. male admitted with NSTEMI. History of HTN, borderline DM, dyslipidemia.   Assessment & Plan    1. NSTEMI. Ecg shows evolving anterolateral ST-T changes. Peak troponin 8.24. On ASA, low dose beta blocker, IV heparin, IV Ntg and statin. Now pain free. Plan for cardiac cath with possible PCI today. Will check Echo. Procedure and risks reviewed with patient. 2. HTN- well controlled.  3. Dyslipidemia. Now on high dose lipitor. 4. Borderline DM. A1c 6%.   Signed, Peter Martinique, MD  04/25/2016, 7:32 AM

## 2016-04-25 NOTE — Discharge Summary (Signed)
Discharge Summary    Patient ID: Albert Carter,  MRN: [id number], DOB/AGE: 04-24-1945 71 y.o.  Admit date: 04/23/2016 Discharge date: 04/25/2016  Primary Care Provider: Sharilyn Sites CABOT Primary Cardiologist: NEW  Dr. Caryl Comes  Discharge Diagnoses    Principal Problem:   NSTEMI (non-ST elevated myocardial infarction) [facility]) Active Problems:   CAD (coronary artery disease), severe with CABG recommended.    Upper back pain   Hypertensive urgency   Ventricular tachycardia, nonsustained (HCC)   Hyperlipemia D/C'D against medical advice.   Allergies Allergies  Allergen Reactions  . Codeine Other (See Comments)    MAKES HIM SLEEP ALL TIME    Diagnostic Studies/Procedures    Cardiac cath 04/25/16 by Dr. Claiborne Billings  severe 3 vessel disease need for CABG, dictated results pending.     History of Present Illness     71 yo m with no prior cardiac hx presented with chest pain and NSTEMI 04/23/16.  He had chest pain  with radiation to the back and arms bilaterally as well as his neck. About 3 weeks before he had had a similar episode of nonexertional chest discomfort.  His first episode was associated with shortness of breath.  EKG SB with low voltage precordial leads.  Initial troponin elevated at 0.10.  Hospital Course     Consultants: cardiology   Pt admitted with NSTEMI and placed on IV heparin.  BB and high dose statin.  Planned for cath on the 12th.  Pk troponin 8.24.  Trending down on the 12th.  He also had NSVT.  BP was elevated.    Pt underwent cath today and found to have severe disease.  Dr. Jacklynn Ganong consulted for CABG.  Pt has now refused to stay in the hospital.  He has things he needs to do.  He was offered to be d/c'd in AM, but he refused.  He is determined to go home tonight.  He is still on NTG drip will change to po imdur. Continue BB and statin, and asa.  With plan for surgery no plavix.   Pt did agree to return to hospital if pain returned and was severe.   Pt is  being d/c'd against medical advice.  He is aware of danger.  I notified Dr. Prescott Gum as well, who is in OR but could see pt. In office if he is determined to go home.   IV NTG was weaned and placed pt on imdur.  _____________  Discharge Vitals Blood pressure (!) 171/56, pulse 62, temperature 98.2 F (36.8 C), temperature source Oral, resp. rate 16, height 5\' 8"  (1.727 m), weight 258 lb 12.8 oz (117.4 kg), SpO2 97 %.  Filed Weights   04/23/16 1444 04/24/16 0300 04/25/16 0500  Weight: 255 lb 15.3 oz (116.1 kg) 258 lb 2.5 oz (117.1 kg) 258 lb 12.8 oz (117.4 kg)    Labs & Radiologic Studies    CBC  Recent Labs  04/23/16 0340  04/24/16 0229 04/25/16 0155  WBC 7.7  --  10.5 9.0  NEUTROABS 3.7  --   --   --   HGB 15.6  < > 14.3 13.7  HCT 45.4  < > 42.9 40.9  MCV 94.2  --  93.5 94.2  PLT 202  --  198 198  < > = values in this interval not displayed. Basic Metabolic Panel  Recent Labs  04/23/16 0340 04/23/16 0419 04/23/16 0655 04/24/16 0229  NA 135 139  --  132*  K  3.6 3.7  --  3.8  CL 103 104  --  98*  CO2 22  --   --  23  GLUCOSE 148* 149*  --  180*  BUN 25* 28*  --  13  CREATININE 0.89 1.00  --  0.81  CALCIUM 9.2  --   --  8.9  MG  --   --  1.4* 1.9   Liver Function Tests  Recent Labs  04/23/16 0340 04/24/16 0229  AST 20 65*  ALT 21 23  ALKPHOS 47 43  BILITOT 0.6 0.6  PROT 7.4 6.5  ALBUMIN 4.0 3.4*   No results for input(s): LIPASE, AMYLASE in the last 72 hours. Cardiac Enzymes  Recent Labs  04/24/16 0229 04/24/16 0809 04/24/16 1443  TROPONINI 6.01* 2.86* 3.67*   BNP Invalid input(s): POCBNP D-Dimer No results for input(s): DDIMER in the last 72 hours. Hemoglobin A1C  Recent Labs  04/23/16 1611  HGBA1C 6.0*   Fasting Lipid Panel  Recent Labs  04/23/16 0340  CHOL 202*  HDL 37*  LDLCALC 98  TRIG 337*  CHOLHDL 5.5   Thyroid Function Tests No results for input(s): TSH, T4TOTAL, T3FREE, THYROIDAB in the last 72 hours.  Invalid  input(s): FREET3 _____________  Dg Chest Port 1 View  Result Date: 04/23/2016 CLINICAL DATA:  Upper back pain, chest pain, hypertension. EXAM: PORTABLE CHEST 1 VIEW COMPARISON:  05/01/2015 FINDINGS: Shallow inspiration. Mild cardiac enlargement. No pulmonary vascular congestion or edema. No focal consolidation. No blunting of costophrenic angles. No pneumothorax. Mediastinal contours appear intact. IMPRESSION: No active disease. Electronically Signed   By: Lucienne Capers M.D.   On: 04/23/2016 04:07   Ct Angio Chest/abd/pel For Dissection W And/or W/wo  Result Date: 04/23/2016 CLINICAL DATA:  Pain between the shoulders for 1 month. Chest pain radiating down both arms beginning yesterday. Hypertension. EXAM: CT ANGIOGRAPHY CHEST, ABDOMEN AND PELVIS TECHNIQUE: Multidetector CT imaging through the chest, abdomen and pelvis was performed using the standard protocol during bolus administration of intravenous contrast. Multiplanar reconstructed images and MIPs were obtained and reviewed to evaluate the vascular anatomy. CONTRAST:  100 mL Isovue 370 COMPARISON:  None. FINDINGS: CTA CHEST FINDINGS Cardiovascular: Noncontrast images of the chest demonstrate coronary artery, aortic valve, and aortic calcifications. No intramural hematoma. Images obtained during arterial phase after contrast administration demonstrate normal caliber thoracic aorta. No aortic dissection. Great vessel origins are patent. Normal heart size. No pericardial effusions. Mediastinum/Nodes: No enlarged mediastinal, hilar, or axillary lymph nodes. Thyroid gland, trachea, and esophagus demonstrate no significant findings. Lungs/Pleura: Motion artifact limits the examination. No focal consolidation or volume loss. No pleural effusions. No pneumothorax. Airways are patent. Musculoskeletal: Degenerative changes in the spine. Normal alignment. No destructive bone lesions. Review of the MIP images confirms the above findings. CTA ABDOMEN AND PELVIS  FINDINGS VASCULAR Aorta: Normal caliber aorta without aneurysm, dissection, vasculitis or significant stenosis. Aortic calcifications. Celiac: Patent without evidence of aneurysm, dissection, vasculitis or significant stenosis. SMA: Patent without evidence of aneurysm, dissection, vasculitis or significant stenosis. Renals: Duplicated right renal artery. Bilateral renal arteries are patent without evidence of aneurysm, dissection, vasculitis, fibromuscular dysplasia or significant stenosis. IMA: Patent without evidence of aneurysm, dissection, vasculitis or significant stenosis. Inflow: Patent without evidence of aneurysm, dissection, vasculitis or significant stenosis. Veins: No obvious venous abnormality within the limitations of this arterial phase study. Review of the MIP images confirms the above findings. NON-VASCULAR Hepatobiliary: Mild diffuse fatty infiltration of the liver. No focal liver lesions. Gallbladder and bile ducts  are unremarkable. Pancreas: Unremarkable. No pancreatic ductal dilatation or surrounding inflammatory changes. Spleen: Normal in size without focal abnormality. Adrenals/Urinary Tract: Adrenal glands are unremarkable. Kidneys are normal, without renal calculi, focal lesion, or hydronephrosis. Bladder is unremarkable. Stomach/Bowel: Stomach is within normal limits. Appendix appears normal. No evidence of bowel wall thickening, distention, or inflammatory changes. Lymphatic: No significant vascular findings are present. No enlarged abdominal or pelvic lymph nodes. Reproductive: Prostate is unremarkable. Other: No abdominal wall hernia or abnormality. No abdominopelvic ascites. Musculoskeletal: Degenerative changes in the spine. No destructive bone lesions. Schmorl's nodes. Review of the MIP images confirms the above findings. IMPRESSION: No evidence of aneurysm or dissection of the thoracic or abdominal aorta. Aortic atherosclerosis. No acute process demonstrated in the chest, abdomen, or  pelvis. Electronically Signed   By: Lucienne Capers M.D.   On: 04/23/2016 04:49   Disposition   Pt is being discharged against medical advice.    Follow-up Plans & Appointments   Call [facility] at [phone] if any bleeding, swelling or drainage at cath site.  May shower, no tub baths for 48 hours for groin sticks. No lifting over 5 pounds for 5 days.  No Driving for 5 days  You have critical coronary artery disease and you had a heart attack.  Come to the hospital for pain.  Heart Healthy diet  Take 1 NTG, under your tongue, while sitting.  If no relief of pain may repeat NTG, one tab every 5 minutes up to 3 tablets total over 15 minutes.  If no relief CALL 911.  If you have dizziness/lightheadness  while taking NTG, stop taking and call 911.         Follow-up Information    Tharon Aquas Trigt III, MD Follow up.   Specialty:  Cardiothoracic Surgery Why:  his office will call for you to be seen this week. Contact information: [address] [place] [address] [phone]        Virl Axe, MD Follow up.   Specialty:  Cardiology Why:  the office will call with date and time Contact information: [address] [place] [address] [phone]            Discharge Medications   Current Discharge Medication List    START taking these medications   Details  acetaminophen (TYLENOL) 325 MG tablet Take 2 tablets (650 mg total) by mouth every 4 (four) hours as needed for headache or mild pain.    aspirin 81 MG chewable tablet Chew 1 tablet (81 mg total) by mouth daily.    atorvastatin (LIPITOR) 80 MG tablet Take 1 tablet (80 mg total) by mouth daily at 6 PM. Qty: 30 tablet, Refills: 6    isosorbide mononitrate (IMDUR) 60 MG 24 hr tablet Take 1 tablet (60 mg total) by mouth daily. Qty: 30 tablet, Refills: 6    metoprolol tartrate (LOPRESSOR) 25 MG tablet Take 0.5 tablets (12.5 mg total) by mouth 2 (two) times  daily. Qty: 30 tablet, Refills: 6    nitroGLYCERIN (NITROSTAT) 0.4 MG SL tablet Place 1 tablet (0.4 mg total) under the tongue every 5 (five) minutes x 3 doses as needed for chest pain. Qty: 25 tablet, Refills: 6         Aspirin prescribed at discharge?  Yes High Intensity Statin Prescribed? (Lipitor 40-80mg  or Crestor 20-40mg ): Yes Beta Blocker Prescribed? Yes For EF <40%, was ACEI/ARB Prescribed? No: NA ADP Receptor Inhibitor Prescribed? (i.e. Plavix  etc.-Includes Medically Managed Patients): No: needs cabg For EF <40%, Aldosterone Inhibitor Prescribed? No: na Was EF assessed during THIS hospitalization? Yes Was Cardiac Rehab II ordered? (Included Medically managed Patients): No: for CABG   Outstanding Labs/Studies   TCTS appointment.    Duration of Discharge Encounter   Greater than 30 minutes including physician time.  Signed, Cecilie Kicks NP 04/25/2016, 8:43 PM

## 2016-04-25 NOTE — Progress Notes (Signed)
ANTICOAGULATION CONSULT NOTE - Follow Up Consult  Pharmacy Consult for Heparin Indication: chest pain/ACS  Allergies  Allergen Reactions  . Codeine Other (See Comments)    MAKES HIM SLEEP ALL TIME    Patient Measurements: Height: 5\' 8"  (172.7 cm) Weight: 258 lb 2.5 oz (117.1 kg) IBW/kg (Calculated) : 68.4 HEPARIN DW (KG): 94.7  Vital Signs: Temp: 98.1 F (36.7 C) (02/11 2334) Temp Source: Oral (02/11 2334) BP: 123/62 (02/11 2334) Pulse Rate: 65 (02/11 2334)  Labs:  Recent Labs  04/23/16 0340 04/23/16 0419  04/23/16 2103 04/24/16 0229 04/24/16 0809 04/24/16 1443 04/25/16 0155  HGB 15.6 16.0  --   --  14.3  --   --  13.7  HCT 45.4 47.0  --   --  42.9  --   --  40.9  PLT 202  --   --   --  198  --   --  198  APTT 33  --   --   --   --   --   --   --   LABPROT 13.2  --   --   --   --   --   --   --   INR 1.00  --   --   --   --   --   --   --   HEPARINUNFRC  --   --   < > 0.30 0.30  --   --  0.23*  CREATININE 0.89 1.00  --   --  0.81  --   --   --   TROPONINI 0.10*  --   < > 8.24* 6.01* 2.86* 3.67*  --   < > = values in this interval not displayed.  Estimated Creatinine Clearance: 105.5 mL/min (by C-G formula based on SCr of 0.81 mg/dL).  Assessment: 71 yo male on heparin for NSTEMI. Heparin level down to subtherapeutic (0.23) on gtt at 1500 units/hr. No issues with line or bleeding reported per RN. Plan cath 2/12   Goal of Therapy:  Heparin level 0.3-0.7 units/ml Monitor platelets by anticoagulation protocol: Yes   Plan:  Increase heparin infusion to 1700 units/hr Will f/u 8 hr heparin level or f/u post cath  Sherlon Handing, PharmD, BCPS Clinical pharmacist, pager [phone] 04/25/2016 3:39 AM

## 2016-04-25 NOTE — Discharge Instructions (Signed)
Call [facility] at [phone] if any bleeding, swelling or drainage at cath site.  May shower, no tub baths for 48 hours for groin sticks. No lifting over 5 pounds for 5 days.  No Driving for 5 days  You have critical coronary artery disease and you had a heart attack.  Come to the hospital for pain.  Heart Healthy diet  Take 1 NTG, under your tongue, while sitting.  If no relief of pain may repeat NTG, one tab every 5 minutes up to 3 tablets total over 15 minutes.  If no relief CALL 911.  If you have dizziness/lightheadness  while taking NTG, stop taking and call 911.

## 2016-04-26 ENCOUNTER — Encounter (HOSPITAL_COMMUNITY): Payer: Self-pay | Admitting: Cardiovascular Disease

## 2016-04-26 LAB — VAS US DOPPLER PRE CABG
LEFT ECA DIAS: 0 cm/s
LEFT VERTEBRAL DIAS: 11 cm/s
Left CCA dist dias: 7 cm/s
Left CCA dist sys: 62 cm/s
Left CCA prox dias: 0 cm/s
Left CCA prox sys: 101 cm/s
Left ICA dist dias: -12 cm/s
Left ICA dist sys: -31 cm/s
Left ICA prox dias: 86 cm/s
Left ICA prox sys: 301 cm/s
RIGHT ECA DIAS: -4 cm/s
RIGHT VERTEBRAL DIAS: 9 cm/s
Right CCA prox dias: 14 cm/s
Right CCA prox sys: 96 cm/s
Right cca dist sys: -84 cm/s

## 2016-04-26 SURGERY — CORONARY ARTERY BYPASS GRAFTING (CABG)
Anesthesia: General | Site: Chest

## 2016-04-29 ENCOUNTER — Emergency Department (HOSPITAL_COMMUNITY): Payer: Medicare Other

## 2016-04-29 ENCOUNTER — Emergency Department (HOSPITAL_COMMUNITY)
Admission: EM | Admit: 2016-04-29 | Discharge: 2016-04-29 | Disposition: A | Discharge status: Home / Self Care | Payer: Medicare Other | Attending: Emergency Medicine | Admitting: Emergency Medicine

## 2016-04-29 ENCOUNTER — Telehealth: Payer: Self-pay | Admitting: Internal Medicine

## 2016-04-29 ENCOUNTER — Encounter (HOSPITAL_COMMUNITY): Payer: Self-pay

## 2016-04-29 DIAGNOSIS — Z79899 Other long term (current) drug therapy: Secondary | ICD-10-CM | POA: Diagnosis not present

## 2016-04-29 DIAGNOSIS — Z7982 Long term (current) use of aspirin: Secondary | ICD-10-CM | POA: Insufficient documentation

## 2016-04-29 DIAGNOSIS — J45909 Unspecified asthma, uncomplicated: Secondary | ICD-10-CM | POA: Diagnosis not present

## 2016-04-29 DIAGNOSIS — M79602 Pain in left arm: Secondary | ICD-10-CM | POA: Insufficient documentation

## 2016-04-29 DIAGNOSIS — I2510 Atherosclerotic heart disease of native coronary artery without angina pectoris: Secondary | ICD-10-CM | POA: Insufficient documentation

## 2016-04-29 DIAGNOSIS — I252 Old myocardial infarction: Secondary | ICD-10-CM | POA: Insufficient documentation

## 2016-04-29 LAB — BASIC METABOLIC PANEL
Anion gap: 8 (ref 5–15)
BUN: 13 mg/dL (ref 6–20)
CHLORIDE: 104 mmol/L (ref 101–111)
CO2: 25 mmol/L (ref 22–32)
Calcium: 9.3 mg/dL (ref 8.9–10.3)
Creatinine, Ser: 1.05 mg/dL (ref 0.61–1.24)
GFR calc Af Amer: >60 mL/min (ref >60)
GFR calc non Af Amer: >60 mL/min (ref >60)
GLUCOSE: 152 mg/dL — AB (ref 65–99)
POTASSIUM: 3.7 mmol/L (ref 3.5–5.1)
SODIUM: 137 mmol/L (ref 135–145)

## 2016-04-29 LAB — CBC
HEMATOCRIT: 43.7 % (ref 39.0–52.0)
Hemoglobin: 14.4 g/dL (ref 13.0–17.0)
MCH: 31.2 pg (ref 26.0–34.0)
MCHC: 33 g/dL (ref 30.0–36.0)
MCV: 94.6 fL (ref 78.0–100.0)
Platelets: 233 10*3/uL (ref 150–400)
RBC: 4.62 MIL/uL (ref 4.22–5.81)
RDW: 14.9 % (ref 11.5–15.5)
WBC: 7.7 10*3/uL (ref 4.0–10.5)

## 2016-04-29 LAB — TROPONIN I: Troponin I: 0.26 ng/mL (ref <0.03)

## 2016-04-29 LAB — I-STAT TROPONIN, ED: Troponin i, poc: 0.27 ng/mL (ref 0.00–0.08)

## 2016-04-29 NOTE — Telephone Encounter (Signed)
I called and spoke with the patient. He was hospitalized earlier this week and had a cardiac cath. He was being evaluated for CABG, but left the hospital AMA as he "had things he had to get done." He is inquiring if he can go ahead and get his surgery done.  I advised him that he will need to speak with Dr. Lucianne Lei Trigt's office.  He is aware that he is already scheduled for follow up on 05/03/16- I advised him that procedures that aren't emergent are not done over the weekend.  He is advised to call Dr. Lucianne Lei Trigt's office for further information. He is agreeable.

## 2016-04-29 NOTE — Discharge Instructions (Signed)
Return here at once for chest pain or chest pressure, dizziness, shortness of breath, or any symptoms are similar to when you had your heart attack last week.

## 2016-04-29 NOTE — Telephone Encounter (Signed)
New Message    Wants to know how soon can he have procedure done, can you do it this weekend?

## 2016-04-29 NOTE — ED Provider Notes (Signed)
[place] DEPT Provider Note   CSN: [id number] Arrival date & time: 04/29/16  1759     History   Chief Complaint Chief Complaint  Patient presents with  . Chest Pain    HPI [place] is a 71 y.o. male.  71 year old male presents with intermittent left arm pain 24 hours. Review the old chart shows that he was recently admitted for an NSTEMI and had a heart catheterization which showed severe multivessel disease and was recommended that he have bypass surgery. Patient left AMA. He denies having any chest pain or chest pressure since his discharge. He states that he has not been diaphoretic has not had any palpitations. No associated nausea vomiting. States that his current symptoms are not like the prior chest pain he was admitted for. His left arm pain is localized from the elbow to the wrist and is not positional but not associated with any anginal type call these. His back pain which she also notes is been chronic in nature. It is worse with movement. Spoke to the cardia vascular surgeon who recommended he be followed up next week in the office to schedule bypass surgery.      Past Medical History:  Diagnosis Date  . Asthma   . Bronchitis   . CAD (coronary artery disease), severe with CABG recommended.  04/25/2016  . Hyperlipemia 04/25/2016  . NSTEMI (non-ST elevated myocardial infarction) ([place]) 04/23/2016  . Ventricular tachycardia, nonsustained ([place]) 04/23/2016    Patient Active Problem List   Diagnosis Date Noted  . CAD (coronary artery disease), severe with CABG recommended.  04/25/2016  . Hyperlipemia 04/25/2016  . NSTEMI (non-ST elevated myocardial infarction) ([place]) 04/23/2016  . Upper back pain 04/23/2016  . Hypertensive urgency 04/23/2016  . Ventricular tachycardia, nonsustained ([place]) 04/23/2016    Past Surgical History:  Procedure Laterality Date  . COLONOSCOPY  03/24/2011   Procedure: COLONOSCOPY;  Surgeon: Daneil Dolin, MD;  [facility];  Service: Endoscopy;  Laterality: N/A;  11:30 AM  . FACIAL FRACTURE SURGERY  1973   [facility]  . LEFT HEART CATH AND CORONARY ANGIOGRAPHY N/A 04/25/2016   Procedure: Left Heart Cath and Coronary Angiography;  Surgeon: Troy Sine, MD;  Location: [facility];  Service: Cardiovascular;  Laterality: N/A;  . [place]   broke jaw and reset       Home Medications    Prior to Admission medications   Medication Sig Start Date End Date Taking? Authorizing Provider  acetaminophen (TYLENOL) 325 MG tablet Take 2 tablets (650 mg total) by mouth every 4 (four) hours as needed for headache or mild pain. 04/25/16   [name], NP  aspirin 81 MG chewable tablet Chew 1 tablet (81 mg total) by mouth daily. 04/26/16   [name], NP  atorvastatin (LIPITOR) 80 MG tablet Take 1 tablet (80 mg total) by mouth daily at 6 PM. 04/25/16   [name], NP  isosorbide mononitrate (IMDUR) 60 MG 24 hr tablet Take 1 tablet (60 mg total) by mouth daily. 04/25/16   [name], NP  metoprolol tartrate (LOPRESSOR) 25 MG tablet Take 0.5 tablets (12.5 mg total) by mouth 2 (two) times daily. 04/25/16   [name], NP  nitroGLYCERIN (NITROSTAT) 0.4 MG SL tablet Place 1 tablet (0.4 mg total) under the tongue every 5 (five) minutes x 3 doses as needed for chest pain. 04/25/16   [name], NP    Family History  History reviewed. No pertinent family history.  Social History Social History  Substance Use Topics  . Smoking status: Never Smoker  . Smokeless tobacco: Never Used  . Alcohol use No     Allergies   Codeine   Review of Systems Review of Systems  All other systems reviewed and are negative.    Physical Exam Updated Vital Signs BP 148/86   Pulse 73   Temp 97.6 F (36.4 C) (Oral)   Resp 16   SpO2 98%   Physical Exam  Constitutional: He is oriented to person, place, and time. He appears well-developed and well-nourished.  Non-toxic appearance. No distress.    HENT:  Head: Normocephalic and atraumatic.  Eyes: Conjunctivae, EOM and lids are normal. Pupils are equal, round, and reactive to light.  Neck: Normal range of motion. Neck supple. No tracheal deviation present. No thyroid mass present.  Cardiovascular: Normal rate, regular rhythm and normal heart sounds.  Exam reveals no gallop.   No murmur heard. Pulmonary/Chest: Effort normal and breath sounds normal. No stridor. No respiratory distress. He has no decreased breath sounds. He has no wheezes. He has no rhonchi. He has no rales.  Abdominal: Soft. Normal appearance and bowel sounds are normal. He exhibits no distension. There is no tenderness. There is no rebound and no CVA tenderness.  Musculoskeletal: Normal range of motion. He exhibits no edema or tenderness.  Neurological: He is alert and oriented to person, place, and time. He has normal strength. No cranial nerve deficit or sensory deficit. GCS eye subscore is 4. GCS verbal subscore is 5. GCS motor subscore is 6.  Skin: Skin is warm and dry. No abrasion and no rash noted.  Psychiatric: He has a normal mood and affect. His speech is normal and behavior is normal.  Nursing note and vitals reviewed.    ED Treatments / Results  Labs (all labs ordered are listed, but only abnormal results are displayed) Labs Reviewed  BASIC METABOLIC PANEL - Abnormal; Notable for the following:       Result Value   Glucose, Bld 152 (*)    All other components within normal limits  I-STAT TROPOININ, ED - Abnormal; Notable for the following:    Troponin i, poc 0.27 (*)    All other components within normal limits  CBC    EKG  EKG Interpretation  Date/Time:  Friday April 29 2016 18:14:17 EST Ventricular Rate:  74 PR Interval:  168 QRS Duration: 94 QT Interval:  380 QTC Calculation: 421 R Axis:   -38 Text Interpretation:  Normal sinus rhythm Left axis deviation Minimal voltage criteria for LVH, may be normal variant Anterolateral infarct ,  age undetermined Abnormal ECG Confirmed by Zenia Resides  MD, Cristi Gwynn ([id number]) on 04/29/2016 6:56:10 PM       Radiology Dg Chest 2 View  Result Date: 04/29/2016 CLINICAL DATA:  Recent myocardial infarction. Midsternal chest pain with shortness of breath. EXAM: CHEST  2 VIEW COMPARISON:  Radiographs and CT 04/23/2016. FINDINGS: The heart size and mediastinal contours are normal. The lungs are clear. There is no pleural effusion or pneumothorax. No acute osseous findings are identified. Mild thoracic spine degenerative changes are noted. IMPRESSION: Stable chest.  No active cardiopulmonary process. Electronically Signed   By: Richardean Sale M.D.   On: 04/29/2016 18:46    Procedures Procedures (including critical care time)  Medications Ordered in ED Medications - No data to display   Initial Impression / Assessment and Plan / ED Course  I  have reviewed the triage vital signs and the nursing notes.  Pertinent labs & imaging results that were available during my care of the patient were reviewed by me and considered in my medical decision making (see chart for details).   patient's first troponin was slightly elevated but when compared to his last one was significantly decreased. A delta troponin remained stable. Patient is adamant that he does not have any chest pain chest pressure. States that his current symptoms are not like his prior nstemi  Patient to keep his follow-up with cardiovascular services and strict return precautions given  Final Clinical Impressions(s) / ED Diagnoses   Final diagnoses:  None    New Prescriptions New Prescriptions   No medications on file     Lacretia Leigh, MD 04/29/16 2225

## 2016-04-29 NOTE — ED Notes (Signed)
0.26 troponin called by lab

## 2016-04-29 NOTE — ED Triage Notes (Signed)
Pt states seen here last week for MI. Pt states L arm pain and numbness. Pt also complaining of lower left back pain. Pt states scheduled for bypass surgery.

## 2016-05-02 ENCOUNTER — Encounter: Payer: Self-pay | Admitting: Physician Assistant

## 2016-05-02 DIAGNOSIS — I2510 Atherosclerotic heart disease of native coronary artery without angina pectoris: Secondary | ICD-10-CM | POA: Insufficient documentation

## 2016-05-02 DIAGNOSIS — J45909 Unspecified asthma, uncomplicated: Secondary | ICD-10-CM | POA: Diagnosis not present

## 2016-05-02 DIAGNOSIS — I252 Old myocardial infarction: Secondary | ICD-10-CM | POA: Diagnosis not present

## 2016-05-02 DIAGNOSIS — Z7982 Long term (current) use of aspirin: Secondary | ICD-10-CM | POA: Insufficient documentation

## 2016-05-02 DIAGNOSIS — I10 Essential (primary) hypertension: Secondary | ICD-10-CM | POA: Insufficient documentation

## 2016-05-02 LAB — CBC WITH DIFFERENTIAL/PLATELET
BASOS ABS: 0 10*3/uL (ref 0.0–0.1)
Basophils Relative: 0 %
EOS PCT: 3 %
Eosinophils Absolute: 0.2 10*3/uL (ref 0.0–0.7)
HCT: 44.8 % (ref 39.0–52.0)
Hemoglobin: 14.9 g/dL (ref 13.0–17.0)
LYMPHS PCT: 33 %
Lymphs Abs: 3 10*3/uL (ref 0.7–4.0)
MCH: 31.4 pg (ref 26.0–34.0)
MCHC: 33.3 g/dL (ref 30.0–36.0)
MCV: 94.5 fL (ref 78.0–100.0)
Monocytes Absolute: 0.5 10*3/uL (ref 0.1–1.0)
Monocytes Relative: 6 %
NEUTROS ABS: 5.4 10*3/uL (ref 1.7–7.7)
Neutrophils Relative %: 58 %
PLATELETS: 252 10*3/uL (ref 150–400)
RBC: 4.74 MIL/uL (ref 4.22–5.81)
RDW: 14.6 % (ref 11.5–15.5)
WBC: 9.2 10*3/uL (ref 4.0–10.5)

## 2016-05-02 NOTE — ED Triage Notes (Signed)
Pt st's his chest just hasn't felt right for past 3 hours.  Pt denies any pain.  Pt also c/o HTN.  Pt st's he was scheduled to have Bypass Surg 3 weeks ago but st's he had to get some things done first,

## 2016-05-03 ENCOUNTER — Institutional Professional Consult (permissible substitution) (INDEPENDENT_AMBULATORY_CARE_PROVIDER_SITE_OTHER): Payer: Medicare Other | Admitting: Cardiothoracic Surgery

## 2016-05-03 ENCOUNTER — Ambulatory Visit (INDEPENDENT_AMBULATORY_CARE_PROVIDER_SITE_OTHER): Payer: Medicare Other | Admitting: Physician Assistant

## 2016-05-03 ENCOUNTER — Other Ambulatory Visit: Payer: Self-pay | Admitting: *Deleted

## 2016-05-03 ENCOUNTER — Encounter: Payer: Self-pay | Admitting: Physician Assistant

## 2016-05-03 ENCOUNTER — Emergency Department (HOSPITAL_COMMUNITY)
Admission: EM | Admit: 2016-05-03 | Discharge: 2016-05-03 | Disposition: A | Discharge status: Home / Self Care | Payer: Medicare Other | Attending: Emergency Medicine | Admitting: Emergency Medicine

## 2016-05-03 ENCOUNTER — Encounter: Payer: Self-pay | Admitting: Cardiothoracic Surgery

## 2016-05-03 VITALS — BP 131/70 | HR 76 | Resp 16 | Ht 68.0 in | Wt 255.0 lb

## 2016-05-03 VITALS — BP 122/62 | HR 64 | Ht 68.0 in | Wt 258.0 lb

## 2016-05-03 DIAGNOSIS — I2510 Atherosclerotic heart disease of native coronary artery without angina pectoris: Secondary | ICD-10-CM

## 2016-05-03 DIAGNOSIS — E785 Hyperlipidemia, unspecified: Secondary | ICD-10-CM

## 2016-05-03 DIAGNOSIS — I10 Essential (primary) hypertension: Secondary | ICD-10-CM

## 2016-05-03 DIAGNOSIS — I472 Ventricular tachycardia: Secondary | ICD-10-CM | POA: Diagnosis not present

## 2016-05-03 DIAGNOSIS — I4729 Other ventricular tachycardia: Secondary | ICD-10-CM

## 2016-05-03 LAB — COMPREHENSIVE METABOLIC PANEL
ALBUMIN: 4 g/dL (ref 3.5–5.0)
ALK PHOS: 53 U/L (ref 38–126)
ALT: 18 U/L (ref 17–63)
ANION GAP: 12 (ref 5–15)
AST: 20 U/L (ref 15–41)
BUN: 13 mg/dL (ref 6–20)
CALCIUM: 9.5 mg/dL (ref 8.9–10.3)
CHLORIDE: 100 mmol/L — AB (ref 101–111)
CO2: 24 mmol/L (ref 22–32)
Creatinine, Ser: 1 mg/dL (ref 0.61–1.24)
GFR calc non Af Amer: >60 mL/min (ref >60)
GLUCOSE: 109 mg/dL — AB (ref 65–99)
POTASSIUM: 3.9 mmol/L (ref 3.5–5.1)
Sodium: 136 mmol/L (ref 135–145)
Total Bilirubin: 1.2 mg/dL (ref 0.3–1.2)
Total Protein: 7.3 g/dL (ref 6.5–8.1)

## 2016-05-03 LAB — I-STAT TROPONIN, ED: Troponin i, poc: 0.04 ng/mL (ref 0.00–0.08)
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
## 2016-05-03 NOTE — Progress Notes (Signed)
Cardiology Office Note    Date:  05/03/2016   ID:  Carter, Albert Aug 29, 1945, MRN [id number]  PCP:  Purvis Kilts, MD  Cardiologist: Dr. Caryl Comes  Chief Complaint  Patient presents with  . Follow-up    History of Present Illness:  Albert Carter is a 71 y.o. male right from the hospital 04/25/16 after NSTEMI, I per tensive urgency, NSVT. Patient underwent cardiac catheterization was found to have severe three-vessel CAD. Dr. Jacklynn Ganong was consult for CABG but the patient signed out AMA.  The patient was in the emergency room 04/29/16 with chest pain but troponins were slightly elevated but flat. He was in the emergency room today because his wife said his heart rate was 63 bpm and thought this was too low. He is now here for follow-up.  He denies any further chest pain, palpitations, dyspnea, dyspnea on exertion, dizziness or presyncope. He has an appointment with Dr. Jacklynn Ganong this afternoon to discuss CABG.    Past Medical History:  Diagnosis Date  . Asthma   . Bronchitis   . CAD (coronary artery disease), severe with CABG recommended.  04/25/2016  . Hyperlipemia 04/25/2016  . NSTEMI (non-ST elevated myocardial infarction) ([place]) 04/23/2016  . Ventricular tachycardia, nonsustained ([place]) 04/23/2016    Past Surgical History:  Procedure Laterality Date  . COLONOSCOPY  03/24/2011   Procedure: COLONOSCOPY;  Surgeon: Daneil Dolin, MD;  [facility];  Service: Endoscopy;  Laterality: N/A;  11:30 AM  . FACIAL FRACTURE SURGERY  1973   [facility]  . LEFT HEART CATH AND CORONARY ANGIOGRAPHY N/A 04/25/2016   Procedure: Left Heart Cath and Coronary Angiography;  Surgeon: Troy Sine, MD;  Location: [facility];  Service: Cardiovascular;  Laterality: N/A;  . [place]   broke jaw and reset    Current Medications: Outpatient Medications Prior to Visit  Medication Sig Dispense Refill  . acetaminophen (TYLENOL) 325 MG tablet Take 2 tablets (650 mg  total) by mouth every 4 (four) hours as needed for headache or mild pain.    Marland Kitchen aspirin 81 MG chewable tablet Chew 1 tablet (81 mg total) by mouth daily.    Marland Kitchen atorvastatin (LIPITOR) 80 MG tablet Take 1 tablet (80 mg total) by mouth daily at 6 PM. 30 tablet 6  . isosorbide mononitrate (IMDUR) 60 MG 24 hr tablet Take 1 tablet (60 mg total) by mouth daily. 30 tablet 6  . metoprolol tartrate (LOPRESSOR) 25 MG tablet Take 0.5 tablets (12.5 mg total) by mouth 2 (two) times daily. 30 tablet 6  . nitroGLYCERIN (NITROSTAT) 0.4 MG SL tablet Place 1 tablet (0.4 mg total) under the tongue every 5 (five) minutes x 3 doses as needed for chest pain. 25 tablet 6   No facility-administered medications prior to visit.      Allergies:   Codeine   Social History   Social History  . Marital status: Married    Spouse name: N/A  . Number of children: N/A  . Years of education: N/A   Social History Main Topics  . Smoking status: Never Smoker  . Smokeless tobacco: Never Used  . Alcohol use No  . Drug use: No  . Sexual activity: Not Asked   Other Topics Concern  . None   Social History Narrative  . None     Family History:  The patient's family history includes Heart disease in his mother.   ROS:   Please see  the history of present illness.    Review of Systems  Constitution: Negative.  HENT: Negative.   Cardiovascular: Negative.   Respiratory: Negative.   Endocrine: Negative.   Hematologic/Lymphatic: Negative.   Musculoskeletal: Negative.   Gastrointestinal: Negative.   Genitourinary: Negative.   Neurological: Negative.    All other systems reviewed and are negative.   PHYSICAL EXAM:   VS:  BP 122/62   Pulse 64   Ht 5\' 8"  (1.727 m)   Wt 258 lb (117 kg)   BMI 39.23 kg/m   Physical Exam  GEN: Obese, in no acute distress  Neck: right carotid bruit, no JVD,  or masses Cardiac:RRR; no murmurs, rubs, or gallops  Respiratory:  clear to auscultation bilaterally, normal work of  breathing GI: soft, nontender, nondistended, + BS Ext: Right radial artery at cath site without hematoma or hemorrhage, lower extremities without cyanosis, clubbing, or edema, Good distal pulses bilaterally Psych: euthymic mood, full affect  Wt Readings from Last 3 Encounters:  05/03/16 258 lb (117 kg)  04/25/16 258 lb 12.8 oz (117.4 kg)  03/20/16 259 lb (117.5 kg)      Studies/Labs Reviewed:   EKG:  EKG is not ordered today. EKG reviewed from last night normal sinus rhythm with left anterior fascicular block, anterior wall MI and nonspecific ST-T wave changes Recent Labs: 04/24/2016: Magnesium 1.9 05/02/2016: ALT 18; BUN 13; Creatinine, Ser 1.00; Hemoglobin 14.9; Platelets 252; Potassium 3.9; Sodium 136   Lipid Panel    Component Value Date/Time   CHOL 202 (H) 04/23/2016 0340   TRIG 337 (H) 04/23/2016 0340   HDL 37 (L) 04/23/2016 0340   CHOLHDL 5.5 04/23/2016 0340   VLDL 67 (H) 04/23/2016 0340   LDLCALC 98 04/23/2016 0340    Additional studies/ records that were reviewed today include:  Cardiac cath 04/25/16 Conclusion     Ost LM to LM lesion, 40 %stenosed.  Ost 1st Diag lesion, 50 %stenosed.  Mid LAD-2 lesion, 70 %stenosed.  Mid LAD-1 lesion, 30 %stenosed.  Dist LAD-2 lesion, 90 %stenosed.  Dist LAD-1 lesion, 80 %stenosed.  Ost Cx to Prox Cx lesion, 70 %stenosed.  Dist Cx lesion, 95 %stenosed.  Prox RCA lesion, 30 %stenosed.  2nd RPLB lesion, 55 %stenosed.  The left ventricular ejection fraction is 50-55% by visual estimate.  The left ventricular systolic function is normal.  There is a 95% very distal near apical LAD stenosis   Low-normal global ejection fraction with focal distal inferior hypocontractility and an ejection fraction of 50-55%.  Summary:   - Findings are consistent with a 1-39 percent stenosis involving   the right internal carotid artery.   Findings are consistent with a 57 - 42 percent stenosis involving   the left internal carotid  artery.   The vertebral arteries demonstrate antegrade flow. - Findings are consistent with a 51 - 41 percent stenosis involving   the left internal carotid artery. - Right ABI of 1.39 and left ABI of 1.42 are suggestive of falsely   elevated pressures likely due to medial calcification of the   arteries.   Prepared and Electronically Authenticated by   Leia Alf, MD 2018-02-13T14:46:05  Severe multivessel CAD with 40% ostial narrowing of the left main; diffuse multiple LAD stenoses of 30, 70 and 70% proximally, 80 and 90% in the mid segment, and 95% distal-apical with 50% first diagonal stenosis; 70% ostial stenosis in the left circumflex vessel with 95% mid AV groove stenosis prior to bifurcating into a large OM  branch; and 30% proximal RCA with 50-60% ostial PLA stenosis.   RECOMMENDATION: Surguical consultation for CABG surgery is recommended.         ASSESSMENT:    1. Coronary artery disease involving native coronary artery of native heart without angina pectoris   2. Ventricular tachycardia, nonsustained ([place])   3. Essential hypertension   4. Hyperlipidemia, unspecified hyperlipidemia type      PLAN:  In order of problems listed above:  CAD status post recent MI with three-vessel CAD and normal LVEF at cardiac cath. CABG recommended. Initially signed out AMA. 2 emergency room visits since then. Meeting with the surgeon today.  NSVT on metoprolol  Essential hypertension controlled  Hyperlipidemia on Lipitor.    Medication Adjustments/Labs and Tests Ordered: Current medicines are reviewed at length with the patient today.  Concerns regarding medicines are outlined above.  Medication changes, Labs and Tests ordered today are listed in the Patient Instructions below. Patient Instructions  Your physician recommends that you continue on your current medications as directed. Please refer to the Current Medication list given to you today.   Your physician  recommends that you schedule a follow-up appointment in:  [place], Ermalinda Barrios, PA-C  05/03/2016 12:09 PM    [address], [place], [place]  [address] Phone: [phone]; Fax: [phone]

## 2016-05-03 NOTE — Patient Instructions (Signed)
Your physician recommends that you continue on your current medications as directed. Please refer to the Current Medication list given to you today.   Your physician recommends that you schedule a follow-up appointment in:  [place]

## 2016-05-03 NOTE — ED Provider Notes (Signed)
[facility] DEPT Provider Note   CSN: [id number] Arrival date & time: 05/02/16  2329   By signing my name below, I, Albert Carter, attest that this documentation has been prepared under the direction and in the presence of [name], MD . Electronically Signed: Evelene Carter, Scribe. 05/03/2016. 1:21 AM.   History   Chief Complaint Chief Complaint  Patient presents with  . Hypertension    The history is provided by the patient. No language interpreter was used.     HPI Comments:  Albert Carter is a 71 y.o. male who presents to the Emergency Department complaining of elevated BP today; reports BP of 99991111 systolically PTA. He has a h/o HTN and states he is complaint with his meds. Pt states he had an MI on 04/23/2016 and is going to schedule a CABG with his surgeon today (05/03/2016). He denies acute CP. No SOB. Pt has no physical complaints or associated symptoms at this time.  Past Medical History:  Diagnosis Date  . Asthma   . Bronchitis   . CAD (coronary artery disease), severe with CABG recommended.  04/25/2016  . Hyperlipemia 04/25/2016  . NSTEMI (non-ST elevated myocardial infarction) ([place]) 04/23/2016  . Ventricular tachycardia, nonsustained ([place]) 04/23/2016    Patient Active Problem List   Diagnosis Date Noted  . CAD (coronary artery disease), severe with CABG recommended.  04/25/2016  . Hyperlipemia 04/25/2016  . NSTEMI (non-ST elevated myocardial infarction) ([place]) 04/23/2016  . Upper back pain 04/23/2016  . Hypertensive urgency 04/23/2016  . Ventricular tachycardia, nonsustained ([place]) 04/23/2016    Past Surgical History:  Procedure Laterality Date  . COLONOSCOPY  03/24/2011   Procedure: COLONOSCOPY;  Surgeon: Daneil Dolin, MD;  [facility];  Service: Endoscopy;  Laterality: N/A;  11:30 AM  . FACIAL FRACTURE SURGERY  1973   [facility]  . LEFT HEART CATH AND CORONARY ANGIOGRAPHY N/A 04/25/2016   Procedure: Left Heart Cath and Coronary Angiography;   Surgeon: Troy Sine, MD;  Location: [facility];  Service: Cardiovascular;  Laterality: N/A;  . [place]   broke jaw and reset       Home Medications    Prior to Admission medications   Medication Sig Start Date End Date Taking? Authorizing Provider  acetaminophen (TYLENOL) 325 MG tablet Take 2 tablets (650 mg total) by mouth every 4 (four) hours as needed for headache or mild pain. 04/25/16   [name], NP  aspirin 81 MG chewable tablet Chew 1 tablet (81 mg total) by mouth daily. Patient not taking: Reported on 04/29/2016 04/26/16   [name], NP  atorvastatin (LIPITOR) 80 MG tablet Take 1 tablet (80 mg total) by mouth daily at 6 PM. 04/25/16   [name], NP  isosorbide mononitrate (IMDUR) 60 MG 24 hr tablet Take 1 tablet (60 mg total) by mouth daily. 04/25/16   [name], NP  metoprolol tartrate (LOPRESSOR) 25 MG tablet Take 0.5 tablets (12.5 mg total) by mouth 2 (two) times daily. 04/25/16   [name], NP  nitroGLYCERIN (NITROSTAT) 0.4 MG SL tablet Place 1 tablet (0.4 mg total) under the tongue every 5 (five) minutes x 3 doses as needed for chest pain. 04/25/16   [name], NP    Family History No family history on file.  Social History Social History  Substance Use Topics  . Smoking status: Never Smoker  . Smokeless tobacco: Never Used  . Alcohol use No  Allergies   Codeine   Review of Systems Review of Systems  10 systems reviewed and all are negative for acute change except as noted in the HPI.   Physical Exam Updated Vital Signs BP 141/72 (BP Location: Right Arm)   Pulse 63   Temp 97.8 F (36.6 C) (Oral)   Resp 20   SpO2 96%   Physical Exam  Constitutional: He is oriented to person, place, and time. Vital signs are normal. He appears well-developed and well-nourished.  Non-toxic appearance. He does not appear ill. No distress.  HENT:  Head: Normocephalic and atraumatic.  Nose: Nose normal.    Mouth/Throat: Oropharynx is clear and moist. No oropharyngeal exudate.  Eyes: Conjunctivae and EOM are normal. Pupils are equal, round, and reactive to light. No scleral icterus.  Neck: Normal range of motion. Neck supple. No tracheal deviation, no edema, no erythema and normal range of motion present. No thyroid mass and no thyromegaly present.  Cardiovascular: Normal rate, regular rhythm, S1 normal, S2 normal, normal heart sounds, intact distal pulses and normal pulses.  Exam reveals no gallop and no friction rub.   No murmur heard. Pulmonary/Chest: Effort normal and breath sounds normal. No respiratory distress. He has no wheezes. He has no rhonchi. He has no rales.  Abdominal: Soft. Normal appearance and bowel sounds are normal. He exhibits no distension, no ascites and no mass. There is no hepatosplenomegaly. There is no tenderness. There is no rebound, no guarding and no CVA tenderness.  Musculoskeletal: Normal range of motion. He exhibits no edema or tenderness.  Lymphadenopathy:    He has no cervical adenopathy.  Neurological: He is alert and oriented to person, place, and time. He has normal strength. No cranial nerve deficit or sensory deficit.  Skin: Skin is warm, dry and intact. No petechiae and no rash noted. He is not diaphoretic. No erythema. No pallor.  Nursing note and vitals reviewed.    ED Treatments / Results  DIAGNOSTIC STUDIES:  Oxygen Saturation is 96% on RA, normal by my interpretation.    COORDINATION OF CARE:  1:18 AM Discussed treatment plan with pt at bedside and pt agreed to plan.  Labs (all labs ordered are listed, but only abnormal results are displayed) Labs Reviewed  COMPREHENSIVE METABOLIC PANEL - Abnormal; Notable for the following:       Result Value   Chloride 100 (*)    Glucose, Bld 109 (*)    All other components within normal limits  CBC WITH DIFFERENTIAL/PLATELET  Randolm Idol, ED    EKG  EKG Interpretation  Date/Time:  Monday  May 02 2016 23:32:57 EST Ventricular Rate:  65 PR Interval:  172 QRS Duration: 94 QT Interval:  408 QTC Calculation: 424 R Axis:   -46 Text Interpretation:  Normal sinus rhythm Left anterior fascicular block Anterolateral infarct , age undetermined Abnormal ECG No significant change since last tracing Confirmed by Glynn Octave [phone]) on 05/03/2016 12:51:27 AM       Radiology No results found.  Procedures Procedures (including critical care time)  Medications Ordered in ED Medications - No data to display   Initial Impression / Assessment and Plan / ED Course  I have reviewed the triage vital signs and the nursing notes.  Pertinent labs & imaging results that were available during my care of the patient were reviewed by me and considered in my medical decision making (see chart for details).      Patient presents to the ED for HTN.  He continues to be completely asymptomatic from this with a normal exam.  He has follow up tomorrow with surgery and cardiology for his CABG.  Troponin was not clinically indicated in this case, but has come back negative.  EKG is unchanged from previous.  BP is now down to 141/72.  He appears well and in NAD. VS remain within hisn ormal limits and he is safe for DC.     Final Clinical Impressions(s) / ED Diagnoses   Final diagnoses:  None    New Prescriptions New Prescriptions   No medications on file   I personally performed the services described in this documentation, which was scribed in my presence. The recorded information has been reviewed and is accurate.       [name], MD 05/03/16 [phone]

## 2016-05-04 ENCOUNTER — Ambulatory Visit (HOSPITAL_COMMUNITY)
Admission: RE | Admit: 2016-05-04 | Discharge: 2016-05-04 | Disposition: A | Discharge status: Home / Self Care | Payer: Medicare Other | Source: Ambulatory Visit | Attending: Cardiothoracic Surgery | Admitting: Cardiothoracic Surgery

## 2016-05-04 DIAGNOSIS — I2510 Atherosclerotic heart disease of native coronary artery without angina pectoris: Secondary | ICD-10-CM

## 2016-05-04 LAB — PULMONARY FUNCTION TEST
DL/VA % pred: 115 %
DL/VA: 5.17 ml/min/mmHg/L
DLCO cor % pred: 86 %
DLCO cor: 25.57 ml/min/mmHg
DLCO unc % pred: 87 %
DLCO unc: 25.79 ml/min/mmHg
FEF 25-75 Post: 3.65 L/sec
FEF 25-75 Pre: 3.27 L/sec
FEF2575-%Change-Post: 11 %
FEF2575-%Pred-Post: 162 %
FEF2575-%Pred-Pre: 145 %
FEV1-%Change-Post: 6 %
FEV1-%Pred-Post: 102 %
FEV1-%Pred-Pre: 96 %
FEV1-Post: 3.05 L
FEV1-Pre: 2.87 L
FEV1FVC-%Change-Post: 1 %
FEV1FVC-%Pred-Pre: 113 %
FEV6-%Change-Post: 5 %
FEV6-%Pred-Post: 94 %
FEV6-%Pred-Pre: 90 %
FEV6-Post: 3.62 L
FEV6-Pre: 3.44 L
FEV6FVC-%Change-Post: 0 %
FEV6FVC-%Pred-Post: 106 %
FEV6FVC-%Pred-Pre: 105 %
FVC-%Change-Post: 4 %
FVC-%Pred-Post: 89 %
FVC-%Pred-Pre: 85 %
FVC-Post: 3.62 L
FVC-Pre: 3.48 L
Post FEV1/FVC ratio: 84 %
Post FEV6/FVC ratio: 100 %
Pre FEV1/FVC ratio: 83 %
Pre FEV6/FVC Ratio: 99 %
RV % pred: 103 %
RV: 2.45 L
TLC % pred: 87 %
TLC: 5.82 L

## 2016-05-04 MED ORDER — ALBUTEROL SULFATE (2.5 MG/3ML) 0.083% IN NEBU
2.5000 mg | INHALATION_SOLUTION | Freq: Once | RESPIRATORY_TRACT | Status: AC
  Administered 2016-05-04: 2.5 mg via RESPIRATORY_TRACT

## 2016-05-06 ENCOUNTER — Ambulatory Visit (HOSPITAL_COMMUNITY)
Admission: RE | Admit: 2016-05-06 | Discharge: 2016-05-06 | Disposition: A | Discharge status: Home / Self Care | Payer: Medicare Other | Source: Ambulatory Visit | Attending: Cardiothoracic Surgery | Admitting: Cardiothoracic Surgery

## 2016-05-06 ENCOUNTER — Encounter (HOSPITAL_COMMUNITY): Payer: Self-pay

## 2016-05-06 ENCOUNTER — Encounter (HOSPITAL_COMMUNITY)
Admission: RE | Admit: 2016-05-06 | Discharge: 2016-05-06 | Disposition: A | Discharge status: Home / Self Care | Payer: Medicare Other | Source: Ambulatory Visit | Attending: Cardiothoracic Surgery | Admitting: Cardiothoracic Surgery

## 2016-05-06 DIAGNOSIS — Z01810 Encounter for preprocedural cardiovascular examination: Secondary | ICD-10-CM | POA: Diagnosis present

## 2016-05-06 DIAGNOSIS — Z01812 Encounter for preprocedural laboratory examination: Secondary | ICD-10-CM | POA: Diagnosis not present

## 2016-05-06 DIAGNOSIS — I2510 Atherosclerotic heart disease of native coronary artery without angina pectoris: Secondary | ICD-10-CM | POA: Diagnosis not present

## 2016-05-06 HISTORY — DX: Essential (primary) hypertension: I10

## 2016-05-06 HISTORY — DX: Unspecified osteoarthritis, unspecified site: M19.90

## 2016-05-06 LAB — URINALYSIS, ROUTINE W REFLEX MICROSCOPIC
Bilirubin Urine: NEGATIVE
Glucose, UA: NEGATIVE mg/dL
Hgb urine dipstick: NEGATIVE
Ketones, ur: NEGATIVE mg/dL
Leukocytes, UA: NEGATIVE
Nitrite: NEGATIVE
Protein, ur: NEGATIVE mg/dL
Specific Gravity, Urine: 1.019 (ref 1.005–1.030)
pH: 7 (ref 5.0–8.0)

## 2016-05-06 LAB — BLOOD GAS, ARTERIAL
Acid-Base Excess: 0.8 mmol/L (ref 0.0–2.0)
Bicarbonate: 24.5 mmol/L (ref 20.0–28.0)
Drawn by: 421801
O2 Saturation: 96.5 %
Patient temperature: 98.6
pCO2 arterial: 36.7 mmHg (ref 32.0–48.0)
pH, Arterial: 7.44 (ref 7.350–7.450)
pO2, Arterial: 87.4 mmHg (ref 83.0–108.0)

## 2016-05-06 LAB — PROTIME-INR
INR: 1.16
Prothrombin Time: 14.8 seconds (ref 11.4–15.2)

## 2016-05-06 LAB — SURGICAL PCR SCREEN
MRSA, PCR: NEGATIVE
Staphylococcus aureus: NEGATIVE

## 2016-05-06 LAB — TYPE AND SCREEN
ABO/RH(D): A POS
Antibody Screen: NEGATIVE

## 2016-05-06 LAB — CBC
HCT: 43.6 % (ref 39.0–52.0)
Hemoglobin: 14.6 g/dL (ref 13.0–17.0)
MCH: 31.7 pg (ref 26.0–34.0)
MCHC: 33.5 g/dL (ref 30.0–36.0)
MCV: 94.8 fL (ref 78.0–100.0)
Platelets: 188 10*3/uL (ref 150–400)
RBC: 4.6 MIL/uL (ref 4.22–5.81)
RDW: 14.5 % (ref 11.5–15.5)
WBC: 7.9 10*3/uL (ref 4.0–10.5)

## 2016-05-06 LAB — COMPREHENSIVE METABOLIC PANEL
ALT: 17 U/L (ref 17–63)
AST: 25 U/L (ref 15–41)
Albumin: 3.5 g/dL (ref 3.5–5.0)
Alkaline Phosphatase: 51 U/L (ref 38–126)
Anion gap: 9 (ref 5–15)
BUN: 18 mg/dL (ref 6–20)
CO2: 21 mmol/L — ABNORMAL LOW (ref 22–32)
Calcium: 9.1 mg/dL (ref 8.9–10.3)
Chloride: 107 mmol/L (ref 101–111)
Creatinine, Ser: 1.08 mg/dL (ref 0.61–1.24)
GFR calc Af Amer: >60 mL/min (ref >60)
GFR calc non Af Amer: >60 mL/min (ref >60)
Glucose, Bld: 112 mg/dL — ABNORMAL HIGH (ref 65–99)
Potassium: 4.4 mmol/L (ref 3.5–5.1)
Sodium: 137 mmol/L (ref 135–145)
Total Bilirubin: 0.9 mg/dL (ref 0.3–1.2)
Total Protein: 6.6 g/dL (ref 6.5–8.1)

## 2016-05-06 LAB — ABO/RH: ABO/RH(D): A POS

## 2016-05-06 LAB — APTT: aPTT: 35 seconds (ref 24–36)

## 2016-05-06 NOTE — Progress Notes (Signed)
Bilateral Lower Extremity Vein Map   Right Great Saphenous Vein  Segment Diameter Comment  1. Origin 9.4 mm   2. High Thigh 5.9 mm   3. Mid Thigh 4.7 mm Branch  4. Low Thigh 3.1 mm Branch  5. At Knee 2.3 mm   6. High Calf 3.2 mm Branch  7. Low Calf 2.9 mm Branch  8. Ankle 2.9 mm Branch    Left Great Saphenous Vein  Segment Diameter Comment  1. Origin 11 mm   2. High Thigh 6.7 mm Branch  3. Mid Thigh 5.3 mm Branch  4. Low Thigh 4 mm Branch  5. At Knee 3.6 mm   6. High Calf 3.6 mm Branch  7. Low Calf 3.1 mm Branch  8. Ankle 3.1 mm

## 2016-05-06 NOTE — Pre-Procedure Instructions (Signed)
Albert Carter  05/06/2016      [place], [place] [address] Phone: [phone] Fax: [phone]  [id number] - [place], [id number] [address] Phone: [phone] Fax: [phone]    Your procedure is scheduled on 05/10/2016  Report to [facility] Admitting at 5:30 A.M.  Call this number if you have problems the morning of surgery:  [phone]   Remember:  Do not eat food or drink liquids after midnight.    Take these medicines the morning of surgery with A SIP OF WATER :ISOSORBIDE, METOPROLOL   Do not wear jewelry   Do not wear lotions, powders, or perfumes, or deoderant.             Men may shave face and neck.   Do not bring valuables to the hospital.   [facility] is not responsible for any belongings or valuables.  Contacts, dentures or bridgework may not be worn into surgery.  Leave your suitcase in the car.  After surgery it may be brought to your room.  For patients admitted to the hospital, discharge time will be determined by your treatment team.  Patients discharged the day of surgery will not be allowed to drive home.   Name and phone number of your driver:   With family  Special instructions:  Special Instructions: [facility] - Preparing for Surgery  Before surgery, you can play an important role.  Because skin is not sterile, your skin needs to be as free of germs as possible.  You can reduce the number of germs on you skin by washing with CHG (chlorahexidine gluconate) soap before surgery.  CHG is an antiseptic cleaner which kills germs and bonds with the skin to continue killing germs even after washing.  Please DO NOT use if you have an allergy to CHG or antibacterial soaps.  If your skin becomes reddened/irritated stop using the CHG and inform your nurse when you arrive at Short Stay.  Do not shave  (including legs and underarms) for at least 48 hours prior to the first CHG shower.  You may shave your face.  Please follow these instructions carefully:   1.  Shower with CHG Soap the night before surgery and the  morning of Surgery.  2.  If you choose to wash your hair, wash your hair first as usual with your  normal shampoo.  3.  After you shampoo, rinse your hair and body thoroughly to remove the  Shampoo.  4.  Use CHG as you would any other liquid soap.  You can apply chg directly to the skin and wash gently with scrungie or a clean washcloth.  5.  Apply the CHG Soap to your body ONLY FROM THE NECK DOWN.    Do not use on open wounds or open sores.  Avoid contact with your eyes, ears, mouth and genitals (private parts).  Wash genitals (private parts)   with your normal soap.  6.  Wash thoroughly, paying special attention to the area where your surgery will be performed.  7.  Thoroughly rinse your body with warm water from the neck down.  8.  DO NOT shower/wash with your normal soap after using and rinsing off   the CHG Soap.  9.  Pat yourself dry with a clean towel.  10.  Wear clean pajamas.            11.  Place clean sheets on your bed the night of your first shower and do not sleep with pets.  Day of Surgery  Do not apply any lotions/deodorants the morning of surgery.  Please wear clean clothes to the hospital/surgery center.  Please read over the following fact sheets that you were given. Pain Booklet, Coughing and Deep Breathing, MRSA Information and Surgical Site Infection Prevention

## 2016-05-06 NOTE — Progress Notes (Signed)
   05/06/16 1402  OBSTRUCTIVE SLEEP APNEA  Have you ever been diagnosed with sleep apnea through a sleep study? No  Do you snore loudly (loud enough to be heard through closed doors)?  0  Do you often feel tired, fatigued, or sleepy during the daytime (such as falling asleep during driving or talking to someone)? 0  Has anyone observed you stop breathing during your sleep? 0  Do you have, or are you being treated for high blood pressure? 1  BMI more than 35 kg/m2? 1  Age > 50 (1-yes) 1  Neck circumference greater than:Male 16 inches or larger, Male 17inches or larger? 1  Male Gender (Yes=1) 1  Obstructive Sleep Apnea Score 5  Score 5 or greater  Results sent to PCP

## 2016-05-06 NOTE — Progress Notes (Signed)
Pt. Denies chest concerns today. Pt. States Dr. Hilma Favors at [facility]. has been the only care he has  had preceding the event earlier this month. Pt. Unsure about his meds, whether he has started lipitor yet or not but seems confident that he is taking the metoprolol & imdur. He denies needing  the nitroglycerin since leaving the hosp.

## 2016-05-07 LAB — HEMOGLOBIN A1C
Hgb A1c MFr Bld: 6.3 % — ABNORMAL HIGH (ref 4.8–5.6)
Mean Plasma Glucose: 134 mg/dL

## 2016-05-09 MED ORDER — TRANEXAMIC ACID 1000 MG/10ML IV SOLN
1.5000 mg/kg/h | INTRAVENOUS | Status: AC
  Administered 2016-05-10: 1.5 mg/kg/h via INTRAVENOUS
  Filled 2016-05-09: qty 25

## 2016-05-09 MED ORDER — HEPARIN SODIUM (PORCINE) 1000 UNIT/ML IJ SOLN
INTRAMUSCULAR | Status: DC
  Filled 2016-05-09: qty 30

## 2016-05-09 MED ORDER — SODIUM CHLORIDE 0.9 % IV SOLN
INTRAVENOUS | Status: AC
  Administered 2016-05-10: 1.6 [IU]/h via INTRAVENOUS
  Filled 2016-05-09: qty 2.5

## 2016-05-09 MED ORDER — POTASSIUM CHLORIDE 2 MEQ/ML IV SOLN
80.0000 meq | INTRAVENOUS | Status: DC
  Filled 2016-05-09: qty 40

## 2016-05-09 MED ORDER — DEXTROSE 5 % IV SOLN
750.0000 mg | INTRAVENOUS | Status: DC
  Filled 2016-05-09: qty 750

## 2016-05-09 MED ORDER — CHLORHEXIDINE GLUCONATE 0.12 % MT SOLN
15.0000 mL | Freq: Once | OROMUCOSAL | Status: AC
  Administered 2016-05-10: 15 mL via OROMUCOSAL
  Filled 2016-05-09: qty 15

## 2016-05-09 MED ORDER — TRANEXAMIC ACID (OHS) PUMP PRIME SOLUTION
2.0000 mg/kg | INTRAVENOUS | Status: DC
  Filled 2016-05-09: qty 2.36

## 2016-05-09 MED ORDER — METOPROLOL TARTRATE 12.5 MG HALF TABLET
12.5000 mg | ORAL_TABLET | Freq: Once | ORAL | Status: AC
  Administered 2016-05-10: 12.5 mg via ORAL
  Filled 2016-05-09: qty 1

## 2016-05-09 MED ORDER — EPINEPHRINE PF 1 MG/ML IJ SOLN
0.0000 ug/min | INTRAVENOUS | Status: DC
  Filled 2016-05-09: qty 4

## 2016-05-09 MED ORDER — VANCOMYCIN HCL 10 G IV SOLR
1500.0000 mg | INTRAVENOUS | Status: AC
  Administered 2016-05-10: 1500 mg via INTRAVENOUS
  Filled 2016-05-09: qty 1500

## 2016-05-09 MED ORDER — DEXTROSE 5 % IV SOLN
1.5000 g | INTRAVENOUS | Status: AC
  Administered 2016-05-10: 1.5 g via INTRAVENOUS
  Administered 2016-05-10: .75 g via INTRAVENOUS
  Filled 2016-05-09: qty 1.5

## 2016-05-09 MED ORDER — SODIUM CHLORIDE 0.9 % IV SOLN
30.0000 ug/min | INTRAVENOUS | Status: AC
  Administered 2016-05-10: 50 ug/min via INTRAVENOUS
  Filled 2016-05-09: qty 2

## 2016-05-09 MED ORDER — MAGNESIUM SULFATE 50 % IJ SOLN
40.0000 meq | INTRAMUSCULAR | Status: DC
  Filled 2016-05-09: qty 10

## 2016-05-09 MED ORDER — NITROGLYCERIN IN D5W 200-5 MCG/ML-% IV SOLN
2.0000 ug/min | INTRAVENOUS | Status: AC
  Administered 2016-05-10: 10 ug/min via INTRAVENOUS
  Filled 2016-05-09: qty 250

## 2016-05-09 MED ORDER — TRANEXAMIC ACID (OHS) BOLUS VIA INFUSION
15.0000 mg/kg | INTRAVENOUS | Status: AC
  Administered 2016-05-10: 1770 mg via INTRAVENOUS
  Filled 2016-05-09: qty 1770

## 2016-05-09 MED ORDER — DOPAMINE-DEXTROSE 3.2-5 MG/ML-% IV SOLN
0.0000 ug/kg/min | INTRAVENOUS | Status: AC
  Administered 2016-05-10: 3 ug/kg/min via INTRAVENOUS
  Filled 2016-05-09: qty 250

## 2016-05-09 MED ORDER — PLASMA-LYTE 148 IV SOLN
INTRAVENOUS | Status: AC
  Administered 2016-05-10: 500 mL
  Filled 2016-05-09: qty 2.5

## 2016-05-09 MED ORDER — DEXMEDETOMIDINE HCL IN NACL 400 MCG/100ML IV SOLN
0.1000 ug/kg/h | INTRAVENOUS | Status: AC
  Administered 2016-05-10: .2 ug/kg/h via INTRAVENOUS
  Filled 2016-05-09: qty 100

## 2016-05-10 ENCOUNTER — Encounter (HOSPITAL_COMMUNITY): Payer: Self-pay | Admitting: *Deleted

## 2016-05-10 ENCOUNTER — Inpatient Hospital Stay (HOSPITAL_COMMUNITY)
Admission: RE | Admit: 2016-05-10 | Discharge: 2016-05-17 | DRG: 235 | Disposition: A | Discharge status: Home / Self Care | Payer: Medicare Other | Source: Ambulatory Visit | Attending: Cardiothoracic Surgery | Admitting: Cardiothoracic Surgery

## 2016-05-10 ENCOUNTER — Inpatient Hospital Stay (HOSPITAL_COMMUNITY): Payer: Medicare Other | Admitting: Certified Registered Nurse Anesthetist

## 2016-05-10 ENCOUNTER — Inpatient Hospital Stay (HOSPITAL_COMMUNITY): Payer: Medicare Other

## 2016-05-10 ENCOUNTER — Encounter (HOSPITAL_COMMUNITY)
Admission: RE | Disposition: A | Discharge status: Home / Self Care | Payer: Self-pay | Source: Ambulatory Visit | Attending: Cardiothoracic Surgery

## 2016-05-10 DIAGNOSIS — I2510 Atherosclerotic heart disease of native coronary artery without angina pectoris: Secondary | ICD-10-CM

## 2016-05-10 DIAGNOSIS — E8770 Fluid overload, unspecified: Secondary | ICD-10-CM | POA: Diagnosis not present

## 2016-05-10 DIAGNOSIS — Z885 Allergy status to narcotic agent status: Secondary | ICD-10-CM

## 2016-05-10 DIAGNOSIS — I214 Non-ST elevation (NSTEMI) myocardial infarction: Secondary | ICD-10-CM | POA: Diagnosis present

## 2016-05-10 DIAGNOSIS — I25110 Atherosclerotic heart disease of native coronary artery with unstable angina pectoris: Secondary | ICD-10-CM | POA: Diagnosis present

## 2016-05-10 DIAGNOSIS — J9811 Atelectasis: Secondary | ICD-10-CM | POA: Diagnosis not present

## 2016-05-10 DIAGNOSIS — R7303 Prediabetes: Secondary | ICD-10-CM | POA: Diagnosis present

## 2016-05-10 DIAGNOSIS — Z4682 Encounter for fitting and adjustment of non-vascular catheter: Secondary | ICD-10-CM

## 2016-05-10 DIAGNOSIS — D62 Acute posthemorrhagic anemia: Secondary | ICD-10-CM | POA: Diagnosis not present

## 2016-05-10 DIAGNOSIS — I10 Essential (primary) hypertension: Secondary | ICD-10-CM | POA: Diagnosis present

## 2016-05-10 DIAGNOSIS — Z6841 Body Mass Index (BMI) 40.0 and over, adult: Secondary | ICD-10-CM | POA: Diagnosis not present

## 2016-05-10 DIAGNOSIS — R001 Bradycardia, unspecified: Secondary | ICD-10-CM | POA: Diagnosis not present

## 2016-05-10 DIAGNOSIS — R112 Nausea with vomiting, unspecified: Secondary | ICD-10-CM | POA: Diagnosis not present

## 2016-05-10 DIAGNOSIS — E785 Hyperlipidemia, unspecified: Secondary | ICD-10-CM | POA: Diagnosis present

## 2016-05-10 DIAGNOSIS — J45909 Unspecified asthma, uncomplicated: Secondary | ICD-10-CM | POA: Diagnosis present

## 2016-05-10 DIAGNOSIS — D696 Thrombocytopenia, unspecified: Secondary | ICD-10-CM | POA: Diagnosis not present

## 2016-05-10 DIAGNOSIS — I8393 Asymptomatic varicose veins of bilateral lower extremities: Secondary | ICD-10-CM | POA: Diagnosis present

## 2016-05-10 DIAGNOSIS — Z951 Presence of aortocoronary bypass graft: Secondary | ICD-10-CM

## 2016-05-10 HISTORY — PX: CORONARY ARTERY BYPASS GRAFT: SHX141

## 2016-05-10 HISTORY — PX: TEE WITHOUT CARDIOVERSION: SHX5443

## 2016-05-10 LAB — GLUCOSE, CAPILLARY
Glucose-Capillary: 134 mg/dL — ABNORMAL HIGH (ref 65–99)
Glucose-Capillary: 140 mg/dL — ABNORMAL HIGH (ref 65–99)
Glucose-Capillary: 150 mg/dL — ABNORMAL HIGH (ref 65–99)
Glucose-Capillary: 157 mg/dL — ABNORMAL HIGH (ref 65–99)
Glucose-Capillary: 172 mg/dL — ABNORMAL HIGH (ref 65–99)
Glucose-Capillary: 165 mg/dL — ABNORMAL HIGH (ref 65–99)
Glucose-Capillary: 177 mg/dL — ABNORMAL HIGH (ref 65–99)
Glucose-Capillary: 136 mg/dL — ABNORMAL HIGH (ref 65–99)

## 2016-05-10 LAB — APTT: aPTT: 33 seconds (ref 24–36)

## 2016-05-10 LAB — POCT I-STAT, CHEM 8
BUN: 16 mg/dL (ref 6–20)
BUN: 14 mg/dL (ref 6–20)
BUN: 14 mg/dL (ref 6–20)
BUN: 13 mg/dL (ref 6–20)
BUN: 13 mg/dL (ref 6–20)
BUN: 12 mg/dL (ref 6–20)
BUN: 12 mg/dL (ref 6–20)
Calcium, Ion: 1.25 mmol/L (ref 1.15–1.40)
Calcium, Ion: 1.12 mmol/L — ABNORMAL LOW (ref 1.15–1.40)
Calcium, Ion: 1.17 mmol/L (ref 1.15–1.40)
Calcium, Ion: 1.04 mmol/L — ABNORMAL LOW (ref 1.15–1.40)
Calcium, Ion: 1.1 mmol/L — ABNORMAL LOW (ref 1.15–1.40)
Calcium, Ion: 0.97 mmol/L — ABNORMAL LOW (ref 1.15–1.40)
Calcium, Ion: 1.22 mmol/L (ref 1.15–1.40)
Chloride: 104 mmol/L (ref 101–111)
Chloride: 103 mmol/L (ref 101–111)
Chloride: 104 mmol/L (ref 101–111)
Chloride: 101 mmol/L (ref 101–111)
Chloride: 103 mmol/L (ref 101–111)
Chloride: 104 mmol/L (ref 101–111)
Chloride: 107 mmol/L (ref 101–111)
Creatinine, Ser: 0.8 mg/dL (ref 0.61–1.24)
Creatinine, Ser: 0.5 mg/dL — ABNORMAL LOW (ref 0.61–1.24)
Creatinine, Ser: 0.6 mg/dL — ABNORMAL LOW (ref 0.61–1.24)
Creatinine, Ser: 0.5 mg/dL — ABNORMAL LOW (ref 0.61–1.24)
Creatinine, Ser: 0.6 mg/dL — ABNORMAL LOW (ref 0.61–1.24)
Creatinine, Ser: 0.5 mg/dL — ABNORMAL LOW (ref 0.61–1.24)
Creatinine, Ser: 0.6 mg/dL — ABNORMAL LOW (ref 0.61–1.24)
Glucose, Bld: 140 mg/dL — ABNORMAL HIGH (ref 65–99)
Glucose, Bld: 123 mg/dL — ABNORMAL HIGH (ref 65–99)
Glucose, Bld: 129 mg/dL — ABNORMAL HIGH (ref 65–99)
Glucose, Bld: 116 mg/dL — ABNORMAL HIGH (ref 65–99)
Glucose, Bld: 139 mg/dL — ABNORMAL HIGH (ref 65–99)
Glucose, Bld: 153 mg/dL — ABNORMAL HIGH (ref 65–99)
Glucose, Bld: 168 mg/dL — ABNORMAL HIGH (ref 65–99)
HCT: 41 % (ref 39.0–52.0)
HCT: 36 % — ABNORMAL LOW (ref 39.0–52.0)
HCT: 39 % (ref 39.0–52.0)
HCT: 32 % — ABNORMAL LOW (ref 39.0–52.0)
HCT: 34 % — ABNORMAL LOW (ref 39.0–52.0)
HCT: 32 % — ABNORMAL LOW (ref 39.0–52.0)
HCT: 37 % — ABNORMAL LOW (ref 39.0–52.0)
Hemoglobin: 13.9 g/dL (ref 13.0–17.0)
Hemoglobin: 12.2 g/dL — ABNORMAL LOW (ref 13.0–17.0)
Hemoglobin: 13.3 g/dL (ref 13.0–17.0)
Hemoglobin: 10.9 g/dL — ABNORMAL LOW (ref 13.0–17.0)
Hemoglobin: 11.6 g/dL — ABNORMAL LOW (ref 13.0–17.0)
Hemoglobin: 10.9 g/dL — ABNORMAL LOW (ref 13.0–17.0)
Hemoglobin: 12.6 g/dL — ABNORMAL LOW (ref 13.0–17.0)
Potassium: 3.6 mmol/L (ref 3.5–5.1)
Potassium: 3.4 mmol/L — ABNORMAL LOW (ref 3.5–5.1)
Potassium: 3.7 mmol/L (ref 3.5–5.1)
Potassium: 4.2 mmol/L (ref 3.5–5.1)
Potassium: 4.3 mmol/L (ref 3.5–5.1)
Potassium: 3.7 mmol/L (ref 3.5–5.1)
Potassium: 3.6 mmol/L (ref 3.5–5.1)
Sodium: 140 mmol/L (ref 135–145)
Sodium: 141 mmol/L (ref 135–145)
Sodium: 142 mmol/L (ref 135–145)
Sodium: 139 mmol/L (ref 135–145)
Sodium: 140 mmol/L (ref 135–145)
Sodium: 141 mmol/L (ref 135–145)
Sodium: 140 mmol/L (ref 135–145)
TCO2: 29 mmol/L (ref 0–100)
TCO2: 26 mmol/L (ref 0–100)
TCO2: 27 mmol/L (ref 0–100)
TCO2: 27 mmol/L (ref 0–100)
TCO2: 28 mmol/L (ref 0–100)
TCO2: 26 mmol/L (ref 0–100)
TCO2: 24 mmol/L (ref 0–100)

## 2016-05-10 LAB — CBC
HCT: 36.7 % — ABNORMAL LOW (ref 39.0–52.0)
HCT: 36.2 % — ABNORMAL LOW (ref 39.0–52.0)
Hemoglobin: 12.1 g/dL — ABNORMAL LOW (ref 13.0–17.0)
Hemoglobin: 12.1 g/dL — ABNORMAL LOW (ref 13.0–17.0)
MCH: 30.9 pg (ref 26.0–34.0)
MCH: 31.3 pg (ref 26.0–34.0)
MCHC: 33 g/dL (ref 30.0–36.0)
MCHC: 33.4 g/dL (ref 30.0–36.0)
MCV: 93.6 fL (ref 78.0–100.0)
MCV: 93.5 fL (ref 78.0–100.0)
Platelets: 141 10*3/uL — ABNORMAL LOW (ref 150–400)
Platelets: 137 10*3/uL — ABNORMAL LOW (ref 150–400)
RBC: 3.92 MIL/uL — AB (ref 4.22–5.81)
RBC: 3.87 MIL/uL — ABNORMAL LOW (ref 4.22–5.81)
RDW: 14.1 % (ref 11.5–15.5)
RDW: 14.1 % (ref 11.5–15.5)
WBC: 12 10*3/uL — ABNORMAL HIGH (ref 4.0–10.5)
WBC: 12.1 10*3/uL — ABNORMAL HIGH (ref 4.0–10.5)

## 2016-05-10 LAB — PROTIME-INR
INR: 1.47
Prothrombin Time: 18 seconds — ABNORMAL HIGH (ref 11.4–15.2)

## 2016-05-10 LAB — POCT I-STAT 3, ART BLOOD GAS (G3+)
Acid-Base Excess: 2 mmol/L (ref 0.0–2.0)
Acid-base deficit: 1 mmol/L (ref 0.0–2.0)
Acid-base deficit: 4 mmol/L — ABNORMAL HIGH (ref 0.0–2.0)
Bicarbonate: 27.7 mmol/L (ref 20.0–28.0)
Bicarbonate: 24.3 mmol/L (ref 20.0–28.0)
Bicarbonate: 23.4 mmol/L (ref 20.0–28.0)
Bicarbonate: 22.5 mmol/L (ref 20.0–28.0)
Bicarbonate: 22 mmol/L (ref 20.0–28.0)
O2 Saturation: 100 %
O2 Saturation: 100 %
O2 Saturation: 98 %
O2 Saturation: 98 %
O2 Saturation: 95 %
Patient temperature: 36.4
Patient temperature: 36.1
Patient temperature: 36.3
TCO2: 29 mmol/L (ref 0–100)
TCO2: 26 mmol/L (ref 0–100)
TCO2: 24 mmol/L (ref 0–100)
TCO2: 23 mmol/L (ref 0–100)
TCO2: 23 mmol/L (ref 0–100)
pCO2 arterial: 46.3 mmHg (ref 32.0–48.0)
pCO2 arterial: 41.5 mmHg (ref 32.0–48.0)
pCO2 arterial: 32.1 mmHg (ref 32.0–48.0)
pCO2 arterial: 29.4 mmHg — ABNORMAL LOW (ref 32.0–48.0)
pCO2 arterial: 42.5 mmHg (ref 32.0–48.0)
pH, Arterial: 7.385 (ref 7.350–7.450)
pH, Arterial: 7.375 (ref 7.350–7.450)
pH, Arterial: 7.469 — ABNORMAL HIGH (ref 7.350–7.450)
pH, Arterial: 7.488 — ABNORMAL HIGH (ref 7.350–7.450)
pH, Arterial: 7.319 — ABNORMAL LOW (ref 7.350–7.450)
pO2, Arterial: 507 mmHg — ABNORMAL HIGH (ref 83.0–108.0)
pO2, Arterial: 316 mmHg — ABNORMAL HIGH (ref 83.0–108.0)
pO2, Arterial: 88 mmHg (ref 83.0–108.0)
pO2, Arterial: 91 mmHg (ref 83.0–108.0)
pO2, Arterial: 80 mmHg — ABNORMAL LOW (ref 83.0–108.0)

## 2016-05-10 LAB — MAGNESIUM: Magnesium: 2.1 mg/dL (ref 1.7–2.4)

## 2016-05-10 LAB — POCT I-STAT 4, (NA,K, GLUC, HGB,HCT)
Glucose, Bld: 139 mg/dL — ABNORMAL HIGH (ref 65–99)
HCT: 35 % — ABNORMAL LOW (ref 39.0–52.0)
Hemoglobin: 11.9 g/dL — ABNORMAL LOW (ref 13.0–17.0)
Potassium: 3.6 mmol/L (ref 3.5–5.1)
Sodium: 143 mmol/L (ref 135–145)

## 2016-05-10 LAB — CREATININE, SERUM
Creatinine, Ser: 0.83 mg/dL (ref 0.61–1.24)
GFR calc Af Amer: >60 mL/min (ref >60)
GFR calc non Af Amer: >60 mL/min (ref >60)

## 2016-05-10 LAB — HEMOGLOBIN AND HEMATOCRIT, BLOOD
HCT: 33.2 % — ABNORMAL LOW (ref 39.0–52.0)
Hemoglobin: 11 g/dL — ABNORMAL LOW (ref 13.0–17.0)

## 2016-05-10 LAB — PLATELET COUNT: Platelets: 163 10*3/uL (ref 150–400)

## 2016-05-10 SURGERY — CORONARY ARTERY BYPASS GRAFTING (CABG)
Anesthesia: General | Site: Esophagus

## 2016-05-10 MED ORDER — ROCURONIUM BROMIDE 50 MG/5ML IV SOSY
PREFILLED_SYRINGE | INTRAVENOUS | Status: AC
  Filled 2016-05-10: qty 15

## 2016-05-10 MED ORDER — HEPARIN SODIUM (PORCINE) 1000 UNIT/ML IJ SOLN
INTRAMUSCULAR | Status: DC | PRN
  Administered 2016-05-10: 2000 [IU] via INTRAVENOUS
  Administered 2016-05-10: 33000 [IU] via INTRAVENOUS

## 2016-05-10 MED ORDER — VANCOMYCIN HCL IN DEXTROSE 1-5 GM/200ML-% IV SOLN
1000.0000 mg | Freq: Once | INTRAVENOUS | Status: DC
  Filled 2016-05-10: qty 200

## 2016-05-10 MED ORDER — MILRINONE LACTATE IN DEXTROSE 20-5 MG/100ML-% IV SOLN
INTRAVENOUS | Status: DC | PRN
  Administered 2016-05-10: .25 mg/kg/min via INTRAVENOUS

## 2016-05-10 MED ORDER — DEXMEDETOMIDINE HCL IN NACL 200 MCG/50ML IV SOLN
0.0000 ug/kg/h | INTRAVENOUS | Status: DC
  Administered 2016-05-10: 0.5 ug/kg/h via INTRAVENOUS
  Filled 2016-05-10: qty 50

## 2016-05-10 MED ORDER — CHLORHEXIDINE GLUCONATE 0.12 % MT SOLN
15.0000 mL | OROMUCOSAL | Status: AC
  Administered 2016-05-10: 15 mL via OROMUCOSAL

## 2016-05-10 MED ORDER — OXYCODONE HCL 5 MG PO TABS: 5.0000 mg | ORAL_TABLET | ORAL | Status: DC | PRN

## 2016-05-10 MED ORDER — SODIUM CHLORIDE 0.9 % IJ SOLN
INTRAMUSCULAR | Status: AC
  Filled 2016-05-10: qty 30

## 2016-05-10 MED ORDER — FENTANYL CITRATE (PF) 250 MCG/5ML IJ SOLN
INTRAMUSCULAR | Status: DC | PRN
  Administered 2016-05-10: 150 ug via INTRAVENOUS
  Administered 2016-05-10: 50 ug via INTRAVENOUS
  Administered 2016-05-10 (×2): 100 ug via INTRAVENOUS
  Administered 2016-05-10: 50 ug via INTRAVENOUS
  Administered 2016-05-10: 150 ug via INTRAVENOUS
  Administered 2016-05-10: 250 ug via INTRAVENOUS
  Administered 2016-05-10: 150 ug via INTRAVENOUS
  Administered 2016-05-10 (×3): 250 ug via INTRAVENOUS

## 2016-05-10 MED ORDER — ACETAMINOPHEN 160 MG/5ML PO SOLN: 1000.0000 mg | Freq: Four times a day (QID) | ORAL | Status: AC

## 2016-05-10 MED ORDER — SODIUM CHLORIDE 0.45 % IV SOLN
INTRAVENOUS | Status: DC | PRN
  Administered 2016-05-10: 15:00:00 via INTRAVENOUS

## 2016-05-10 MED ORDER — LACTATED RINGERS IV SOLN
INTRAVENOUS | Status: DC | PRN
  Administered 2016-05-10 (×2): via INTRAVENOUS

## 2016-05-10 MED ORDER — ASPIRIN EC 325 MG PO TBEC
325.0000 mg | DELAYED_RELEASE_TABLET | Freq: Every day | ORAL | Status: DC
  Administered 2016-05-11 – 2016-05-12 (×2): 325 mg via ORAL
  Filled 2016-05-10 (×2): qty 1

## 2016-05-10 MED ORDER — MILRINONE LACTATE IN DEXTROSE 20-5 MG/100ML-% IV SOLN
0.1250 ug/kg/min | INTRAVENOUS | Status: DC
  Administered 2016-05-10: 0.25 ug/kg/min via INTRAVENOUS
  Administered 2016-05-12: 0.125 ug/kg/min via INTRAVENOUS
  Filled 2016-05-10 (×3): qty 100

## 2016-05-10 MED ORDER — VECURONIUM BROMIDE 10 MG IV SOLR
INTRAVENOUS | Status: AC
  Filled 2016-05-10: qty 20

## 2016-05-10 MED ORDER — ACETAMINOPHEN 650 MG RE SUPP
650.0000 mg | Freq: Once | RECTAL | Status: AC
  Administered 2016-05-10: 650 mg via RECTAL

## 2016-05-10 MED ORDER — MORPHINE SULFATE (PF) 2 MG/ML IV SOLN: 2.0000 mg | INTRAVENOUS | Status: DC | PRN

## 2016-05-10 MED ORDER — DOCUSATE SODIUM 100 MG PO CAPS
200.0000 mg | ORAL_CAPSULE | Freq: Every day | ORAL | Status: DC
  Administered 2016-05-11 – 2016-05-16 (×5): 200 mg via ORAL
  Filled 2016-05-10 (×6): qty 2

## 2016-05-10 MED ORDER — SODIUM CHLORIDE 0.9 % IV SOLN
INTRAVENOUS | Status: DC | PRN
  Administered 2016-05-10: 12:00:00 via INTRAVENOUS

## 2016-05-10 MED ORDER — METOPROLOL TARTRATE 5 MG/5ML IV SOLN: 2.5000 mg | INTRAVENOUS | Status: DC | PRN

## 2016-05-10 MED ORDER — MIDAZOLAM HCL 5 MG/5ML IJ SOLN
INTRAMUSCULAR | Status: DC | PRN
  Administered 2016-05-10 (×5): 2 mg via INTRAVENOUS
  Administered 2016-05-10: 1 mg via INTRAVENOUS
  Administered 2016-05-10: 2 mg via INTRAVENOUS
  Administered 2016-05-10: 1 mg via INTRAVENOUS

## 2016-05-10 MED ORDER — SODIUM CHLORIDE 0.9 % IV SOLN
0.0000 ug/min | INTRAVENOUS | Status: DC
  Administered 2016-05-10: 0 ug/min via INTRAVENOUS
  Filled 2016-05-10 (×2): qty 2

## 2016-05-10 MED ORDER — METOCLOPRAMIDE HCL 5 MG/ML IJ SOLN
10.0000 mg | Freq: Four times a day (QID) | INTRAMUSCULAR | Status: DC
  Administered 2016-05-10: 10 mg via INTRAVENOUS
  Filled 2016-05-10: qty 2

## 2016-05-10 MED ORDER — MAGNESIUM SULFATE 4 GM/100ML IV SOLN
4.0000 g | Freq: Once | INTRAVENOUS | Status: AC
  Administered 2016-05-10: 4 g via INTRAVENOUS
  Filled 2016-05-10: qty 100

## 2016-05-10 MED ORDER — ACETAMINOPHEN 500 MG PO TABS
1000.0000 mg | ORAL_TABLET | Freq: Four times a day (QID) | ORAL | Status: AC
  Administered 2016-05-10 – 2016-05-14 (×8): 1000 mg via ORAL
  Filled 2016-05-10 (×9): qty 2

## 2016-05-10 MED ORDER — PROTAMINE SULFATE 10 MG/ML IV SOLN
INTRAVENOUS | Status: DC | PRN
  Administered 2016-05-10: 100 mg via INTRAVENOUS
  Administered 2016-05-10 (×4): 50 mg via INTRAVENOUS

## 2016-05-10 MED ORDER — HEPARIN SODIUM (PORCINE) 1000 UNIT/ML IJ SOLN
INTRAMUSCULAR | Status: AC
  Filled 2016-05-10: qty 1

## 2016-05-10 MED ORDER — BISACODYL 5 MG PO TBEC
10.0000 mg | DELAYED_RELEASE_TABLET | Freq: Every day | ORAL | Status: DC
  Administered 2016-05-11: 10 mg via ORAL
  Filled 2016-05-10 (×4): qty 2

## 2016-05-10 MED ORDER — CHLORHEXIDINE GLUCONATE 4 % EX LIQD: 30.0000 mL | CUTANEOUS | Status: DC

## 2016-05-10 MED ORDER — ALBUMIN HUMAN 5 % IV SOLN
INTRAVENOUS | Status: DC | PRN
  Administered 2016-05-10 (×4): via INTRAVENOUS

## 2016-05-10 MED ORDER — CHLORHEXIDINE GLUCONATE 0.12% ORAL RINSE (MEDLINE KIT)
15.0000 mL | Freq: Two times a day (BID) | OROMUCOSAL | Status: DC
  Administered 2016-05-10 – 2016-05-11 (×2): 15 mL via OROMUCOSAL

## 2016-05-10 MED ORDER — FENTANYL CITRATE (PF) 100 MCG/2ML IJ SOLN
50.0000 ug | INTRAMUSCULAR | Status: DC | PRN
  Administered 2016-05-10 – 2016-05-11 (×2): 50 ug via INTRAVENOUS
  Filled 2016-05-10 (×2): qty 2

## 2016-05-10 MED ORDER — 0.9 % SODIUM CHLORIDE (POUR BTL) OPTIME
TOPICAL | Status: DC | PRN
  Administered 2016-05-10: 6000 mL

## 2016-05-10 MED ORDER — SODIUM CHLORIDE 0.9 % IV SOLN: INTRAVENOUS | Status: DC

## 2016-05-10 MED ORDER — MIDAZOLAM HCL 2 MG/2ML IJ SOLN
INTRAMUSCULAR | Status: AC
  Filled 2016-05-10: qty 2

## 2016-05-10 MED ORDER — SODIUM CHLORIDE 0.9% FLUSH: 3.0000 mL | INTRAVENOUS | Status: DC | PRN

## 2016-05-10 MED ORDER — VECURONIUM BROMIDE 10 MG IV SOLR
INTRAVENOUS | Status: DC | PRN
  Administered 2016-05-10 (×3): 5 mg via INTRAVENOUS

## 2016-05-10 MED ORDER — ACETAMINOPHEN 160 MG/5ML PO SOLN: 650.0000 mg | Freq: Once | ORAL | Status: AC

## 2016-05-10 MED ORDER — LACTATED RINGERS IV SOLN
INTRAVENOUS | Status: DC | PRN
  Administered 2016-05-10 (×2): via INTRAVENOUS

## 2016-05-10 MED ORDER — LACTATED RINGERS IV SOLN: INTRAVENOUS | Status: DC

## 2016-05-10 MED ORDER — HEMOSTATIC AGENTS (NO CHARGE) OPTIME
TOPICAL | Status: DC | PRN
  Administered 2016-05-10 (×2): 1 via TOPICAL

## 2016-05-10 MED ORDER — SODIUM CHLORIDE 0.9 % IV SOLN
INTRAVENOUS | Status: DC
  Administered 2016-05-10: 18:00:00 via INTRAVENOUS
  Filled 2016-05-10 (×2): qty 2.5

## 2016-05-10 MED ORDER — TRAMADOL HCL 50 MG PO TABS
50.0000 mg | ORAL_TABLET | ORAL | Status: DC | PRN
  Administered 2016-05-11 – 2016-05-12 (×4): 100 mg via ORAL
  Filled 2016-05-10 (×4): qty 2

## 2016-05-10 MED ORDER — FENTANYL CITRATE (PF) 250 MCG/5ML IJ SOLN
INTRAMUSCULAR | Status: AC
  Filled 2016-05-10: qty 25

## 2016-05-10 MED ORDER — PROTAMINE SULFATE 10 MG/ML IV SOLN
INTRAVENOUS | Status: AC
  Filled 2016-05-10: qty 25

## 2016-05-10 MED ORDER — MIDAZOLAM HCL 10 MG/2ML IJ SOLN
INTRAMUSCULAR | Status: AC
  Filled 2016-05-10: qty 2

## 2016-05-10 MED ORDER — ALBUMIN HUMAN 5 % IV SOLN
250.0000 mL | INTRAVENOUS | Status: AC | PRN
  Administered 2016-05-10 (×2): 250 mL via INTRAVENOUS

## 2016-05-10 MED ORDER — LACTATED RINGERS IV SOLN
INTRAVENOUS | Status: DC
  Administered 2016-05-10 – 2016-05-11 (×2): via INTRAVENOUS

## 2016-05-10 MED ORDER — PROTAMINE SULFATE 10 MG/ML IV SOLN
INTRAVENOUS | Status: AC
  Filled 2016-05-10: qty 5

## 2016-05-10 MED ORDER — ROCURONIUM BROMIDE 10 MG/ML (PF) SYRINGE
PREFILLED_SYRINGE | INTRAVENOUS | Status: DC | PRN
  Administered 2016-05-10 (×3): 50 mg via INTRAVENOUS

## 2016-05-10 MED ORDER — ARTIFICIAL TEARS OP OINT
TOPICAL_OINTMENT | OPHTHALMIC | Status: DC | PRN
  Administered 2016-05-10: 1 via OPHTHALMIC

## 2016-05-10 MED ORDER — MORPHINE SULFATE (PF) 2 MG/ML IV SOLN: 1.0000 mg | INTRAVENOUS | Status: DC | PRN

## 2016-05-10 MED ORDER — SODIUM CHLORIDE 0.9 % IV SOLN: 250.0000 mL | INTRAVENOUS | Status: DC

## 2016-05-10 MED ORDER — ATORVASTATIN CALCIUM 80 MG PO TABS
80.0000 mg | ORAL_TABLET | Freq: Every day | ORAL | Status: DC
  Administered 2016-05-11 – 2016-05-16 (×5): 80 mg via ORAL
  Filled 2016-05-10 (×6): qty 1

## 2016-05-10 MED ORDER — VANCOMYCIN HCL IN DEXTROSE 1-5 GM/200ML-% IV SOLN
1000.0000 mg | Freq: Two times a day (BID) | INTRAVENOUS | Status: AC
  Administered 2016-05-10 – 2016-05-11 (×3): 1000 mg via INTRAVENOUS
  Filled 2016-05-10 (×3): qty 200

## 2016-05-10 MED ORDER — NITROGLYCERIN IN D5W 200-5 MCG/ML-% IV SOLN: 0.0000 ug/min | INTRAVENOUS | Status: DC

## 2016-05-10 MED ORDER — SODIUM CHLORIDE 0.9 % IV SOLN
30.0000 meq | Freq: Once | INTRAVENOUS | Status: AC
  Administered 2016-05-10: 30 meq via INTRAVENOUS
  Filled 2016-05-10: qty 15

## 2016-05-10 MED ORDER — ONDANSETRON HCL 4 MG/2ML IJ SOLN
4.0000 mg | Freq: Four times a day (QID) | INTRAMUSCULAR | Status: DC | PRN
  Administered 2016-05-10 – 2016-05-12 (×5): 4 mg via INTRAVENOUS
  Filled 2016-05-10 (×5): qty 2

## 2016-05-10 MED ORDER — PROPOFOL 10 MG/ML IV BOLUS
INTRAVENOUS | Status: DC | PRN
  Administered 2016-05-10: 100 mg via INTRAVENOUS

## 2016-05-10 MED ORDER — GELATIN ABSORBABLE MT POWD
OROMUCOSAL | Status: DC | PRN
  Administered 2016-05-10 (×3): 4 mL via TOPICAL

## 2016-05-10 MED ORDER — ORAL CARE MOUTH RINSE
15.0000 mL | Freq: Four times a day (QID) | OROMUCOSAL | Status: DC
Start: 2016-05-11 — End: 2016-05-11
  Administered 2016-05-10 – 2016-05-11 (×4): 15 mL via OROMUCOSAL

## 2016-05-10 MED ORDER — MIDAZOLAM HCL 2 MG/2ML IJ SOLN: 2.0000 mg | INTRAMUSCULAR | Status: DC | PRN

## 2016-05-10 MED ORDER — SODIUM CHLORIDE 0.9% FLUSH
3.0000 mL | Freq: Two times a day (BID) | INTRAVENOUS | Status: DC
  Administered 2016-05-11 – 2016-05-14 (×4): 3 mL via INTRAVENOUS

## 2016-05-10 MED ORDER — CEFUROXIME SODIUM 1.5 G IJ SOLR
1.5000 g | Freq: Two times a day (BID) | INTRAMUSCULAR | Status: AC
  Administered 2016-05-10 – 2016-05-12 (×4): 1.5 g via INTRAVENOUS
  Filled 2016-05-10 (×4): qty 1.5

## 2016-05-10 MED ORDER — PROPOFOL 10 MG/ML IV BOLUS
INTRAVENOUS | Status: AC
  Filled 2016-05-10: qty 20

## 2016-05-10 MED ORDER — METOPROLOL TARTRATE 25 MG/10 ML ORAL SUSPENSION
12.5000 mg | Freq: Two times a day (BID) | ORAL | Status: DC
  Filled 2016-05-10: qty 10

## 2016-05-10 MED ORDER — LACTATED RINGERS IV SOLN
INTRAVENOUS | Status: DC | PRN
  Administered 2016-05-10: 07:00:00 via INTRAVENOUS

## 2016-05-10 MED ORDER — LACTATED RINGERS IV SOLN: 500.0000 mL | Freq: Once | INTRAVENOUS | Status: DC | PRN

## 2016-05-10 MED ORDER — FAMOTIDINE IN NACL 20-0.9 MG/50ML-% IV SOLN
20.0000 mg | Freq: Two times a day (BID) | INTRAVENOUS | Status: AC
  Administered 2016-05-10: 20 mg via INTRAVENOUS

## 2016-05-10 MED ORDER — POTASSIUM CHLORIDE 2 MEQ/ML IV SOLN
30.0000 meq | Freq: Once | INTRAVENOUS | Status: AC
  Administered 2016-05-10: 30 meq via INTRAVENOUS

## 2016-05-10 MED ORDER — MILRINONE LACTATE IN DEXTROSE 20-5 MG/100ML-% IV SOLN
0.1250 ug/kg/min | INTRAVENOUS | Status: DC
  Filled 2016-05-10: qty 100

## 2016-05-10 MED ORDER — ASPIRIN 81 MG PO CHEW: 324.0000 mg | CHEWABLE_TABLET | Freq: Every day | ORAL | Status: DC

## 2016-05-10 MED ORDER — INSULIN REGULAR BOLUS VIA INFUSION
0.0000 [IU] | Freq: Three times a day (TID) | INTRAVENOUS | Status: DC
  Filled 2016-05-10: qty 10

## 2016-05-10 MED ORDER — TRANEXAMIC ACID 1000 MG/10ML IV SOLN
INTRAVENOUS | Status: DC
  Filled 2016-05-10: qty 100

## 2016-05-10 MED ORDER — HYDROMORPHONE HCL 2 MG PO TABS
1.0000 mg | ORAL_TABLET | ORAL | Status: DC | PRN
  Administered 2016-05-11: 1 mg via ORAL
  Filled 2016-05-10: qty 1

## 2016-05-10 MED ORDER — PANTOPRAZOLE SODIUM 40 MG PO TBEC
40.0000 mg | DELAYED_RELEASE_TABLET | Freq: Every day | ORAL | Status: DC
  Administered 2016-05-12 – 2016-05-17 (×6): 40 mg via ORAL
  Filled 2016-05-10 (×6): qty 1

## 2016-05-10 MED ORDER — FENTANYL CITRATE (PF) 250 MCG/5ML IJ SOLN
INTRAMUSCULAR | Status: AC
  Filled 2016-05-10: qty 10

## 2016-05-10 MED ORDER — KETOROLAC TROMETHAMINE 15 MG/ML IJ SOLN
15.0000 mg | Freq: Four times a day (QID) | INTRAMUSCULAR | Status: AC | PRN
  Administered 2016-05-10 – 2016-05-11 (×3): 15 mg via INTRAVENOUS
  Filled 2016-05-10 (×3): qty 1

## 2016-05-10 MED ORDER — BISACODYL 10 MG RE SUPP: 10.0000 mg | Freq: Every day | RECTAL | Status: DC

## 2016-05-10 MED ORDER — METOPROLOL TARTRATE 12.5 MG HALF TABLET
12.5000 mg | ORAL_TABLET | Freq: Two times a day (BID) | ORAL | Status: DC
  Administered 2016-05-10 – 2016-05-17 (×14): 12.5 mg via ORAL
  Filled 2016-05-10 (×16): qty 1

## 2016-05-10 SURGICAL SUPPLY — 119 items
ADAPTER CARDIO PERF ANTE/RETRO (ADAPTER) ×4 IMPLANT
ADPR PRFSN 84XANTGRD RTRGD (ADAPTER) ×1
BAG DECANTER FOR FLEXI CONT (MISCELLANEOUS) ×4 IMPLANT
BANDAGE ACE 4X5 VEL STRL LF (GAUZE/BANDAGES/DRESSINGS) ×8 IMPLANT
BANDAGE ACE 6X5 VEL STRL LF (GAUZE/BANDAGES/DRESSINGS) ×4 IMPLANT
BASKET HEART  (ORDER IN 25'S) (MISCELLANEOUS) ×1
BASKET HEART (ORDER IN 25'S) (MISCELLANEOUS) ×1
BASKET HEART (ORDER IN 25S) (MISCELLANEOUS) ×2 IMPLANT
BLADE 11 SAFETY STRL DISP (BLADE) ×4 IMPLANT
BLADE CLIPPER SURG (BLADE) IMPLANT
BLADE STERNUM SYSTEM 6 (BLADE) ×4 IMPLANT
BLADE SURG 12 STRL SS (BLADE) ×4 IMPLANT
BNDG GAUZE ELAST 4 BULKY (GAUZE/BANDAGES/DRESSINGS) ×4 IMPLANT
CANISTER SUCT 3000ML PPV (MISCELLANEOUS) ×4 IMPLANT
CANNULA AORTIC ROOT 9FR (CANNULA) ×4 IMPLANT
CANNULA ARTERIAL NVNT 3/8 22FR (MISCELLANEOUS) ×4 IMPLANT
CANNULA GUNDRY RCSP 15FR (MISCELLANEOUS) ×4 IMPLANT
CATH CPB KIT VANTRIGT (MISCELLANEOUS) ×4 IMPLANT
CATH ROBINSON RED A/P 18FR (CATHETERS) ×28 IMPLANT
CATH THORACIC 36FR RT ANG (CATHETERS) ×4 IMPLANT
CLIP FOGARTY SPRING 6M (CLIP) ×4 IMPLANT
CLIP TI WIDE RED SMALL 24 (CLIP) ×8 IMPLANT
CRADLE DONUT ADULT HEAD (MISCELLANEOUS) ×4 IMPLANT
DRAIN CHANNEL 10M FLAT 3/4 FLT (DRAIN) ×4 IMPLANT
DRAIN CHANNEL 32F RND 10.7 FF (WOUND CARE) ×4 IMPLANT
DRAPE CARDIOVASCULAR INCISE (DRAPES) ×3
DRAPE SLUSH/WARMER DISC (DRAPES) ×4 IMPLANT
DRAPE SRG 135X102X78XABS (DRAPES) ×2 IMPLANT
DRSG AQUACEL AG ADV 3.5X14 (GAUZE/BANDAGES/DRESSINGS) ×4 IMPLANT
ELECT BLADE 4.0 EZ CLEAN MEGAD (MISCELLANEOUS) ×4
ELECT BLADE 6.5 EXT (BLADE) ×4 IMPLANT
ELECT CAUTERY BLADE 6.4 (BLADE) ×4 IMPLANT
ELECT REM PT RETURN 9FT ADLT (ELECTROSURGICAL) ×8
ELECTRODE BLDE 4.0 EZ CLN MEGD (MISCELLANEOUS) ×2 IMPLANT
ELECTRODE REM PT RTRN 9FT ADLT (ELECTROSURGICAL) ×4 IMPLANT
EVACUATOR SILICONE 100CC (DRAIN) ×4 IMPLANT
FELT TEFLON 1X6 (MISCELLANEOUS) ×8 IMPLANT
GAUZE SPONGE 4X4 12PLY STRL (GAUZE/BANDAGES/DRESSINGS) ×8 IMPLANT
GLOVE BIO SURGEON STRL SZ 6.5 (GLOVE) ×18 IMPLANT
GLOVE BIO SURGEON STRL SZ7 (GLOVE) ×16 IMPLANT
GLOVE BIO SURGEON STRL SZ7.5 (GLOVE) ×12 IMPLANT
GLOVE BIO SURGEONS STRL SZ 6.5 (GLOVE) ×6
GLOVE BIOGEL PI IND STRL 6.5 (GLOVE) ×4 IMPLANT
GLOVE BIOGEL PI IND STRL 7.0 (GLOVE) ×4 IMPLANT
GLOVE BIOGEL PI INDICATOR 6.5 (GLOVE) ×4
GLOVE BIOGEL PI INDICATOR 7.0 (GLOVE) ×4
GOWN STRL REUS W/ TWL LRG LVL3 (GOWN DISPOSABLE) ×12 IMPLANT
GOWN STRL REUS W/TWL LRG LVL3 (GOWN DISPOSABLE) ×18
HEMOSTAT POWDER SURGIFOAM 1G (HEMOSTASIS) ×12 IMPLANT
HEMOSTAT SURGICEL 2X14 (HEMOSTASIS) ×4 IMPLANT
INSERT FOGARTY XLG (MISCELLANEOUS) IMPLANT
IV LACTATED RINGERS 500ML (IV SOLUTION) ×8 IMPLANT
KIT BASIN OR (CUSTOM PROCEDURE TRAY) ×4 IMPLANT
KIT ROOM TURNOVER OR (KITS) ×4 IMPLANT
KIT SUCTION CATH 14FR (SUCTIONS) ×4 IMPLANT
KIT VASOVIEW HEMOPRO VH 3000 (KITS) ×4 IMPLANT
LEAD PACING MYOCARDI (MISCELLANEOUS) ×4 IMPLANT
MARKER GRAFT CORONARY BYPASS (MISCELLANEOUS) ×12 IMPLANT
NS IRRIG 1000ML POUR BTL (IV SOLUTION) ×24 IMPLANT
PACK OPEN HEART (CUSTOM PROCEDURE TRAY) ×4 IMPLANT
PAD ARMBOARD 7.5X6 YLW CONV (MISCELLANEOUS) ×8 IMPLANT
PAD ELECT DEFIB RADIOL ZOLL (MISCELLANEOUS) ×4 IMPLANT
PENCIL BUTTON HOLSTER BLD 10FT (ELECTRODE) ×4 IMPLANT
PUNCH AORTIC ROTATE  4.5MM 8IN (MISCELLANEOUS) ×4 IMPLANT
PUNCH AORTIC ROTATE 4.0MM (MISCELLANEOUS) IMPLANT
PUNCH AORTIC ROTATE 4.5MM 8IN (MISCELLANEOUS) IMPLANT
PUNCH AORTIC ROTATE 5MM 8IN (MISCELLANEOUS) IMPLANT
SET CARDIOPLEGIA MPS 5001102 (MISCELLANEOUS) ×4 IMPLANT
SOLUTION ANTI FOG 6CC (MISCELLANEOUS) ×4 IMPLANT
SPONGE GAUZE 4X4 12PLY STER LF (GAUZE/BANDAGES/DRESSINGS) ×8 IMPLANT
SPONGE LAP 18X18 X RAY DECT (DISPOSABLE) ×8 IMPLANT
SURGIFLO W/THROMBIN 8M KIT (HEMOSTASIS) ×4 IMPLANT
SUT BONE WAX W31G (SUTURE) ×4 IMPLANT
SUT ETHIBOND 2 0 SH (SUTURE) ×12
SUT ETHIBOND 2 0 SH 36X2 (SUTURE) ×8 IMPLANT
SUT MNCRL AB 4-0 PS2 18 (SUTURE) ×4 IMPLANT
SUT PROLENE 3 0 SH DA (SUTURE) IMPLANT
SUT PROLENE 3 0 SH1 36 (SUTURE) IMPLANT
SUT PROLENE 4 0 RB 1 (SUTURE) ×16
SUT PROLENE 4 0 SH DA (SUTURE) ×4 IMPLANT
SUT PROLENE 4-0 RB1 .5 CRCL 36 (SUTURE) ×16 IMPLANT
SUT PROLENE 5 0 C 1 36 (SUTURE) ×8 IMPLANT
SUT PROLENE 6 0 C 1 30 (SUTURE) ×20 IMPLANT
SUT PROLENE 6 0 CC (SUTURE) ×28 IMPLANT
SUT PROLENE 8 0 BV175 6 (SUTURE) ×20 IMPLANT
SUT PROLENE BLUE 7 0 (SUTURE) ×8 IMPLANT
SUT SILK  1 MH (SUTURE) ×6
SUT SILK 1 MH (SUTURE) ×6 IMPLANT
SUT SILK 1 TIES 10X30 (SUTURE) ×4 IMPLANT
SUT SILK 2 0 SH CR/8 (SUTURE) ×12 IMPLANT
SUT SILK 2 0 TIES 10X30 (SUTURE) ×4 IMPLANT
SUT SILK 2 0 TIES 17X18 (SUTURE) ×3
SUT SILK 2-0 18XBRD TIE BLK (SUTURE) ×2 IMPLANT
SUT SILK 3 0 SH CR/8 (SUTURE) ×12 IMPLANT
SUT SILK 4 0 TIE 10X30 (SUTURE) ×8 IMPLANT
SUT STEEL 6MS V (SUTURE) IMPLANT
SUT STEEL SZ 6 DBL 3X14 BALL (SUTURE) ×12 IMPLANT
SUT TEM PAC WIRE 2 0 SH (SUTURE) ×16 IMPLANT
SUT VIC AB 1 CTX 36 (SUTURE) ×15
SUT VIC AB 1 CTX36XBRD ANBCTR (SUTURE) ×10 IMPLANT
SUT VIC AB 2-0 CT1 27 (SUTURE) ×3
SUT VIC AB 2-0 CT1 TAPERPNT 27 (SUTURE) ×2 IMPLANT
SUT VIC AB 2-0 CTX 27 (SUTURE) ×8 IMPLANT
SUT VIC AB 3-0 X1 27 (SUTURE) ×8 IMPLANT
SUTURE E-PAK OPEN HEART (SUTURE) ×4 IMPLANT
SYRINGE 20CC LL (MISCELLANEOUS) ×4 IMPLANT
SYSTEM SAHARA CHEST DRAIN ATS (WOUND CARE) ×4 IMPLANT
TAPE CLOTH SURG 4X10 WHT LF (GAUZE/BANDAGES/DRESSINGS) ×4 IMPLANT
TAPE PAPER 2X10 WHT MICROPORE (GAUZE/BANDAGES/DRESSINGS) ×4 IMPLANT
TOWEL GREEN STERILE (TOWEL DISPOSABLE) ×16 IMPLANT
TOWEL GREEN STERILE FF (TOWEL DISPOSABLE) ×8 IMPLANT
TOWEL OR 17X24 6PK STRL BLUE (TOWEL DISPOSABLE) ×8 IMPLANT
TOWEL OR 17X26 10 PK STRL BLUE (TOWEL DISPOSABLE) ×8 IMPLANT
TRAY FOLEY IC TEMP SENS 16FR (CATHETERS) ×4 IMPLANT
TUBING INSUFFLATION (TUBING) ×4 IMPLANT
UNDERPAD 30X30 (UNDERPADS AND DIAPERS) ×4 IMPLANT
VEIN SAPHENOUS CRYO  61-70CM (Tissue) ×2 IMPLANT
VEIN SAPHENOUS CRYO 61-70CM (Tissue) ×2 IMPLANT
WATER STERILE IRR 1000ML POUR (IV SOLUTION) ×8 IMPLANT

## 2016-05-10 NOTE — Anesthesia Procedure Notes (Signed)
Procedure Name: Intubation Date/Time: 05/10/2016 7:53 AM Performed by: Neldon Newport Pre-anesthesia Checklist: Timeout performed, Patient being monitored, Suction available, Emergency Drugs available and Patient identified Patient Re-evaluated:Patient Re-evaluated prior to inductionOxygen Delivery Method: Circle system utilized Preoxygenation: Pre-oxygenation with 100% oxygen Intubation Type: IV induction Ventilation: Mask ventilation without difficulty Laryngoscope Size: Mac and 4 Grade View: Grade I Tube type: Oral Tube size: 8.0 mm Number of attempts: 1 Placement Confirmation: breath sounds checked- equal and bilateral,  positive ETCO2 and ETT inserted through vocal cords under direct vision Secured at: 23 cm Tube secured with: Tape Dental Injury: Teeth and Oropharynx as per pre-operative assessment

## 2016-05-10 NOTE — Op Note (Signed)
NAMEANJUAN, Albert Carter NO.:  [id number]  MEDICAL RECORD NO.:  [id number]  LOCATION:                               FACILITY:  [facility]  PHYSICIAN:  Ivin Poot, M.D.  DATE OF BIRTH:  February 18, 1946  DATE OF PROCEDURE:  05/10/2016 DATE OF DISCHARGE:                              OPERATIVE REPORT   OPERATIONS: 1. Coronary artery bypass grafting (left internal mammary artery to     left anterior descending artery, saphenous vein graft to diagonal,     saphenous vein graft to circumflex marginal, saphenous vein graft     to posterior descending). 2. Endoscopic harvest of right leg greater saphenous vein.  PREOPERATIVE DIAGNOSIS:  Severe 3-vessel coronary artery disease with class 3 progressive angina, preserved left ventricular function.  POSTOPERATIVE DIAGNOSIS:  Severe 3-vessel coronary artery disease with class 3 progressive angina, preserved left ventricular function.  SURGEON:  Ivin Poot, M.D.  ASSISTANT:  Providence Crosby, PA-C.  ANESTHESIA:  General by Dr. Albertha Ghee.  CLINICAL NOTE:  The patient is a 71 year old obese male who presented to the hospital previously this month with symptoms of unstable angina and underwent urgent cardiac catheterization.  This demonstrated severe multivessel coronary artery disease with preserved LV function. Coronary artery bypass grafting was recommended, however, the patient insisted on being released from the hospital to come back later for surgery.  I subsequently examined the patient in the office, and after reviewing his cardiac catheterization, discussed the results of his cardiac cath and discussed the rationale and expected benefits of CABG for treatment of his severe CAD.  I discussed the major aspects of the procedure including the use of general anesthesia and cardiopulmonary bypass, the location of the surgical incisions, and the expected postoperative hospital recovery.  I discussed with the patient the  risks to him of coronary artery bypass surgery including the risks of stroke, MI, bleeding, blood transfusion requirement, postoperative infection, postoperative pulmonary problems including pleural effusion, and death. After reviewing these issues, he demonstrated his understanding and agreed to proceed with surgery under what I felt was an informed consent.  OPERATIVE FINDINGS: 1. Poor quality vessels for grafting with diffuse disease. 2. Adequate conduit. 3. No further conduit available in either leg because of severe     varicose disease. 4. Segment of cryopreserved vein was used for the graft to the     diagonal due to lack of adequate native vein.  DESCRIPTION OF PROCEDURE:  The patient was brought to the operating room and placed supine on the operating table.  General anesthesia was induced under invasive hemodynamic monitoring.  The chest, abdomen, and the legs were prepped with Betadine and draped as a sterile field.  A transesophageal echo probe was placed by the anesthesiologist.  A proper time-out was performed.  A sternal incision was made as the saphenous vein was harvested endoscopically from the right leg.  The left internal mammary artery was harvested as a pedicle graft from its origin at the subclavian vessels. A sternal retractor was placed using deep blades due to the patient's body habitus and morbid obesity.  The pericardium was opened and suspended.  Pursestrings were placed in the ascending  aorta and right atrium, and after the vein was harvested, heparin was administered.  The patient was cannulated, then placed on cardiopulmonary bypass.  The coronaries were identified for grafting and the mammary artery and vein grafts were prepared for the distal anastomoses.  Cardioplegia cannulas were placed for both antegrade and retrograde cold blood cardioplegia. The patient was cooled to 32 degrees.  Aortic crossclamp was applied.  One liter of cold blood  cardioplegia was delivered in split doses between the antegrade aortic and retrograde coronary sinus catheters. There was good cardioplegic arrest and septal temperature dropped less than 14 degrees.  Cardioplegia was delivered every 20 minutes.  The distal coronary anastomoses were performed.  The first distal anastomosis was the posterior descending branch of the right coronary. There was a proximal 80% stenosis.  Reverse saphenous vein was sewn end- to-side with running 7-0 Prolene with good flow through the graft. Cardioplegia was redosed.  The second distal anastomosis was the OM branch of the circumflex.  This was a 1.5 mm vessel with proximal 90% stenosis.  A reverse saphenous vein was sewn end-to-side with running 7-0 Prolene with good flow through the graft.  Cardioplegia was redosed.  The third distal anastomosis was to the second diagonal branch to the LAD.  There was a proximal 80% ostial stenosis.  A reverse saphenous vein was sewn end-to-side using the cryopreserved vein and a running 7-0 Prolene.  There was good flow through the graft.  The final anastomosis was the left IMA to the LAD.  The left IMA was brought through an opening in the left lateral pericardium, was brought down onto the LAD at the distal third of the vessel.  There was diffuse disease both proximally and distally.  The left IMA was sewn end-to-side with running 8-0 Prolene and there was good flow through the graft.  The bulldog was briefly released off the pedicle and there was good flow through the anastomosis.  The bulldog was reapplied and the pedicle was secured to the epicardium.  Cardioplegia was redosed.  While the crossclamp was still in place, 3 proximal vein anastomoses were performed on the ascending aorta using a 4.5 mm punch with running 6-0 Prolene.  Prior to releasing the crossclamp, air was vented from the coronaries with a dose of retrograde warm blood cardioplegia.  The crossclamp  was removed.  The heart was cardioverted back to a regular rhythm.  The vein grafts were de-aired and opened.  Each had good flow.  There was hemostasis at the proximal distal anastomoses of the vein grafts.  The distal left IMA anastomosis had some bleeding at the toe.  This was repaired with a single figure-of-eight 8-0 Prolene.  However, a side branch of the mammary, which was used to gain a purchase of the vessel had evidence of a hematoma and I was uncomfortable with the condition of the mammary artery as a long-term graft.  For this reason, the patient underwent a short and brief repeated hypothermic cardioplegic arrest and the mammary artery was trimmed more proximally and then re-sewn end-to-side with running 8-0 Prolene with good flow and good hemostasis.  After the mammary artery was revised, the patient was rewarmed and reperfused and temporary pacing wires were applied.  The lungs were expanded and the ventilator was resumed.  The patient was then weaned off cardiopulmonary bypass.  Echo showed good ventricular function.  The patient remained stable and cardiac output was 4.5 L/min.  Protamine was administered without adverse reaction.  The  patient was administered FFP for some evidence of diffuse coagulopathy.  A drain had been placed in the right leg because of bleeding in the leg tunnel.  The superior pericardial fat was closed over the aorta.  Anterior mediastinal and left pleural chest tubes were placed and brought out through separate incisions.  The sternum was closed with a wire.  The pectoralis fascia was closed with a running #1 Vicryl.  The subcutaneous and skin layers were closed in running Vicryl and sterile dressings were applied.  Total cardiopulmonary bypass time was 175 minutes.     Ivin Poot, M.D.   ______________________________ Ivin Poot, M.D.    PV/MEDQ  D:  05/10/2016  T:  05/10/2016  Job:  [id number]

## 2016-05-10 NOTE — Progress Notes (Signed)
  Echocardiogram Echocardiogram Transesophageal has been performed.  Albert Carter M 05/10/2016, 9:38 AM

## 2016-05-10 NOTE — Procedures (Signed)
Extubation Procedure Note  Patient Details:   Name: Albert Carter DOB: Nov 06, 1945 MRN: [id number]   Airway Documentation:     Evaluation  O2 sats: stable throughout Complications: No apparent complications Patient did tolerate procedure well. Bilateral Breath Sounds: Clear, Diminished   Yes  4l/min   NIF-20 FVC-867ml  Revonda Standard 05/10/2016, 6:29 PM

## 2016-05-10 NOTE — Op Note (Deleted)
  The note originally documented on this encounter has been moved the the encounter in which it belongs.  

## 2016-05-10 NOTE — Brief Op Note (Addendum)
05/10/2016  11:28 AM  PATIENT:  Albert Carter  71 y.o. male  PRE-OPERATIVE DIAGNOSIS:  CAD  POST-OPERATIVE DIAGNOSIS:  CAD  PROCEDURE:  Procedure(s):  CORONARY ARTERY BYPASS GRAFTING x 4 -LIMA to LAD -CRYO VEIN to DIAGONAL -SVG to OM -SVG to PDA  ENDOSCOPIC HARVEST GREATER SAPHENOUS VEIN -Right leg  TRANSESOPHAGEAL ECHOCARDIOGRAM (TEE) (N/A)  SURGEON:  Surgeon(s) and Role:    * Ivin Poot, MD - Primary  PHYSICIAN ASSISTANT: Ellwood Handler PA-C  ANESTHESIA:   general  EBL:  Total I/O In: 1000 [I.V.:1000] Out: 650 [Urine:650]  BLOOD ADMINISTERED: CELLSAVER  DRAINS: (10 Flat) Jackson-Pratt drain(s) with closed bulb suction in the right leg and Left Pleural and Mediastinal Chest Drains  LOCAL MEDICATIONS USED:  NONE  SPECIMEN:  No Specimen  DISPOSITION OF SPECIMEN:  N/A  COUNTS:  YES  TOURNIQUET:  * No tourniquets in log *  DICTATION: .Dragon Dictation  PLAN OF CARE: Admit to inpatient   PATIENT DISPOSITION:  ICU - intubated and hemodynamically stable.   Delay start of Pharmacological VTE agent (>24hrs) due to surgical blood loss or risk of bleeding: yes

## 2016-05-10 NOTE — Transfer of Care (Signed)
Immediate Anesthesia Transfer of Care Note  Patient: Albert Carter  Procedure(s) Performed: Procedure(s): CORONARY ARTERY BYPASS GRAFTING (CABG) x four , using left internal mammary artery and right leg greater saphenous vein harvested endoscopically, and Cryo saphenous vein (N/A) TRANSESOPHAGEAL ECHOCARDIOGRAM (TEE) (N/A)  Patient Location: SICU  Anesthesia Type:General  Level of Consciousness: Patient remains intubated per anesthesia plan  Airway & Oxygen Therapy: Patient remains intubated per anesthesia plan and Patient placed on Ventilator (see vital sign flow sheet for setting)  Post-op Assessment: Report given to RN and Post -op Vital signs reviewed and stable  Post vital signs: Reviewed and stable  Last Vitals:  Vitals:   05/10/16 0548  BP: (!) 175/80  Pulse: 70  Resp: 18  Temp: 36.4 C    Last Pain:  Vitals:   05/10/16 0548  TempSrc: Oral      Patients Stated Pain Goal: 2 ([id number])  Complications: No apparent anesthesia complications

## 2016-05-10 NOTE — Anesthesia Procedure Notes (Signed)
Central Venous Catheter Insertion Performed by: Catalina Gravel, anesthesiologist Start/End2/27/2018 6:35 AM, 05/10/2016 6:45 AM Patient location: Pre-op. Preanesthetic checklist: patient identified, IV checked, site marked, risks and benefits discussed, surgical consent, monitors and equipment checked, pre-op evaluation, timeout performed and anesthesia consent Lidocaine 1% used for infiltration and patient sedated Hand hygiene performed  and maximum sterile barriers used  Catheter size: 8.5 Fr Sheath introducer Procedure performed using ultrasound guided technique. Ultrasound Notes:anatomy identified, needle tip was noted to be adjacent to the nerve/plexus identified, no ultrasound evidence of intravascular and/or intraneural injection and image(s) printed for medical record Attempts: 1 Following insertion, line sutured and dressing applied. Post procedure assessment: blood return through all ports, free fluid flow and no air  Patient tolerated the procedure well with no immediate complications.

## 2016-05-10 NOTE — Progress Notes (Signed)
CT surgery p.m. Rounds  Doing well after CABG 4 Extubated Hemodynamics stable Sinus rhythm heart rate 84 Minimal chest tube output 6 hour labs satisfactory Patient allergic to morphine-will dose with when necessary fentanyl

## 2016-05-10 NOTE — Anesthesia Preprocedure Evaluation (Addendum)
Anesthesia Evaluation  Patient identified by MRN, date of birth, ID band Patient awake    Reviewed: Allergy & Precautions, NPO status , Patient's Chart, lab work & pertinent test results  Airway Mallampati: II  TM Distance: >3 FB Neck ROM: full    Dental  (+) Edentulous Upper, Edentulous Lower, Dental Advidsory Given   Pulmonary asthma ,    breath sounds clear to auscultation       Cardiovascular hypertension, On Medications + angina + CAD and + Past MI   Rhythm:regular Rate:Normal     Neuro/Psych    GI/Hepatic   Endo/Other    Renal/GU      Musculoskeletal  (+) Arthritis ,   Abdominal   Peds  Hematology   Anesthesia Other Findings   Reproductive/Obstetrics                            Anesthesia Physical Anesthesia Plan  ASA: III  Anesthesia Plan: General   Post-op Pain Management:    Induction: Intravenous  Airway Management Planned: Oral ETT  Additional Equipment:   Intra-op Plan:   Post-operative Plan: Post-operative intubation/ventilation  Informed Consent: I have reviewed the patients History and Physical, chart, labs and discussed the procedure including the risks, benefits and alternatives for the proposed anesthesia with the patient or authorized representative who has indicated his/her understanding and acceptance.   Dental Advisory Given  Plan Discussed with: CRNA, Anesthesiologist and Surgeon  Anesthesia Plan Comments:        Anesthesia Quick Evaluation

## 2016-05-10 NOTE — Progress Notes (Signed)
RT note- sicu wean started.

## 2016-05-10 NOTE — OR Nursing (Signed)
13:20pm - 40 minute call to SICU charge nurse

## 2016-05-10 NOTE — OR Nursing (Signed)
13:55pm - 20 minute to SICU charge nurse

## 2016-05-10 NOTE — Progress Notes (Signed)
The patient was examined and preop studies reviewed. There has been no change from the prior exam and the patient is ready for surgery. Plan CABG on Albert Carter

## 2016-05-10 NOTE — Anesthesia Procedure Notes (Signed)
Central Venous Catheter Insertion Performed by: Catalina Gravel, anesthesiologist Start/End2/27/2018 6:35 AM, 05/10/2016 6:45 AM Patient location: Pre-op. Preanesthetic checklist: patient identified, IV checked, site marked, risks and benefits discussed, surgical consent, monitors and equipment checked, pre-op evaluation, timeout performed and anesthesia consent Hand hygiene performed  and maximum sterile barriers used  Total catheter length 90. PA cath was placed.Swan type:thermodilution PA Cath depth:48 Procedure performed using ultrasound guided technique. Ultrasound Notes:anatomy identified, needle tip was noted to be adjacent to the nerve/plexus identified, no ultrasound evidence of intravascular and/or intraneural injection and image(s) printed for medical record Attempts: 1 Patient tolerated the procedure well with no immediate complications.

## 2016-05-11 ENCOUNTER — Inpatient Hospital Stay (HOSPITAL_COMMUNITY): Payer: Medicare Other

## 2016-05-11 ENCOUNTER — Encounter (HOSPITAL_COMMUNITY): Payer: Self-pay | Admitting: Cardiothoracic Surgery

## 2016-05-11 LAB — CBC
HCT: 36 % — ABNORMAL LOW (ref 39.0–52.0)
HEMATOCRIT: 38.1 % — AB (ref 39.0–52.0)
Hemoglobin: 12 g/dL — ABNORMAL LOW (ref 13.0–17.0)
Hemoglobin: 12.4 g/dL — ABNORMAL LOW (ref 13.0–17.0)
MCH: 31.3 pg (ref 26.0–34.0)
MCH: 30.9 pg (ref 26.0–34.0)
MCHC: 33.3 g/dL (ref 30.0–36.0)
MCHC: 32.5 g/dL (ref 30.0–36.0)
MCV: 93.8 fL (ref 78.0–100.0)
MCV: 95 fL (ref 78.0–100.0)
PLATELETS: 160 10*3/uL (ref 150–400)
Platelets: 149 10*3/uL — ABNORMAL LOW (ref 150–400)
RBC: 3.84 MIL/uL — ABNORMAL LOW (ref 4.22–5.81)
RBC: 4.01 MIL/uL — AB (ref 4.22–5.81)
RDW: 14.2 % (ref 11.5–15.5)
RDW: 14.4 % (ref 11.5–15.5)
WBC: 12.4 10*3/uL — ABNORMAL HIGH (ref 4.0–10.5)
WBC: 16.2 10*3/uL — AB (ref 4.0–10.5)

## 2016-05-11 LAB — GLUCOSE, CAPILLARY
Glucose-Capillary: 101 mg/dL — ABNORMAL HIGH (ref 65–99)
Glucose-Capillary: 102 mg/dL — ABNORMAL HIGH (ref 65–99)
Glucose-Capillary: 112 mg/dL — ABNORMAL HIGH (ref 65–99)
Glucose-Capillary: 113 mg/dL — ABNORMAL HIGH (ref 65–99)
Glucose-Capillary: 131 mg/dL — ABNORMAL HIGH (ref 65–99)
Glucose-Capillary: 146 mg/dL — ABNORMAL HIGH (ref 65–99)
Glucose-Capillary: 161 mg/dL — ABNORMAL HIGH (ref 65–99)
Glucose-Capillary: 165 mg/dL — ABNORMAL HIGH (ref 65–99)
Glucose-Capillary: 171 mg/dL — ABNORMAL HIGH (ref 65–99)
Glucose-Capillary: 88 mg/dL (ref 65–99)
Glucose-Capillary: 90 mg/dL (ref 65–99)
Glucose-Capillary: 95 mg/dL (ref 65–99)

## 2016-05-11 LAB — BPAM FFP
Blood Product Expiration Date: 201803022359
Blood Product Expiration Date: 201803022359
ISSUE DATE / TIME: 201802271140
ISSUE DATE / TIME: 201802271140
Unit Type and Rh: 6200
Unit Type and Rh: 6200

## 2016-05-11 LAB — CREATININE, SERUM: Creatinine, Ser: 0.82 mg/dL (ref 0.61–1.24)

## 2016-05-11 LAB — BASIC METABOLIC PANEL
Anion gap: 6 (ref 5–15)
BUN: 11 mg/dL (ref 6–20)
CHLORIDE: 108 mmol/L (ref 101–111)
CO2: 23 mmol/L (ref 22–32)
CREATININE: 0.74 mg/dL (ref 0.61–1.24)
Calcium: 8.1 mg/dL — ABNORMAL LOW (ref 8.9–10.3)
GFR calc Af Amer: >60 mL/min (ref >60)
GFR calc non Af Amer: >60 mL/min (ref >60)
Glucose, Bld: 95 mg/dL (ref 65–99)
Potassium: 3.9 mmol/L (ref 3.5–5.1)
SODIUM: 137 mmol/L (ref 135–145)

## 2016-05-11 LAB — POCT I-STAT, CHEM 8
BUN: 12 mg/dL (ref 6–20)
Calcium, Ion: 1.22 mmol/L (ref 1.15–1.40)
Chloride: 98 mmol/L — ABNORMAL LOW (ref 101–111)
Creatinine, Ser: 0.7 mg/dL (ref 0.61–1.24)
Glucose, Bld: 234 mg/dL — ABNORMAL HIGH (ref 65–99)
HCT: 37 % — ABNORMAL LOW (ref 39.0–52.0)
Hemoglobin: 12.6 g/dL — ABNORMAL LOW (ref 13.0–17.0)
Potassium: 4 mmol/L (ref 3.5–5.1)
Sodium: 134 mmol/L — ABNORMAL LOW (ref 135–145)
TCO2: 23 mmol/L (ref 0–100)

## 2016-05-11 LAB — PREPARE FRESH FROZEN PLASMA
Unit division: 0
Unit division: 0

## 2016-05-11 LAB — MAGNESIUM
MAGNESIUM: 1.9 mg/dL (ref 1.7–2.4)
MAGNESIUM: 1.8 mg/dL (ref 1.7–2.4)

## 2016-05-11 MED ORDER — INSULIN DETEMIR 100 UNIT/ML ~~LOC~~ SOLN
20.0000 [IU] | Freq: Once | SUBCUTANEOUS | Status: AC
  Administered 2016-05-11: 20 [IU] via SUBCUTANEOUS
  Filled 2016-05-11: qty 0.2

## 2016-05-11 MED ORDER — INSULIN DETEMIR 100 UNIT/ML ~~LOC~~ SOLN
20.0000 [IU] | Freq: Two times a day (BID) | SUBCUTANEOUS | Status: DC
  Administered 2016-05-11 – 2016-05-12 (×2): 20 [IU] via SUBCUTANEOUS
  Filled 2016-05-11 (×4): qty 0.2

## 2016-05-11 MED ORDER — FUROSEMIDE 10 MG/ML IJ SOLN
20.0000 mg | Freq: Two times a day (BID) | INTRAMUSCULAR | Status: DC
  Administered 2016-05-11 (×2): 20 mg via INTRAVENOUS

## 2016-05-11 MED ORDER — ORAL CARE MOUTH RINSE
15.0000 mL | Freq: Two times a day (BID) | OROMUCOSAL | Status: DC
  Administered 2016-05-11 – 2016-05-16 (×4): 15 mL via OROMUCOSAL

## 2016-05-11 MED ORDER — LISINOPRIL 5 MG PO TABS
5.0000 mg | ORAL_TABLET | Freq: Every day | ORAL | Status: DC
  Administered 2016-05-11 – 2016-05-12 (×2): 5 mg via ORAL
  Filled 2016-05-11 (×2): qty 2

## 2016-05-11 MED ORDER — ENOXAPARIN SODIUM 40 MG/0.4ML ~~LOC~~ SOLN
40.0000 mg | Freq: Every day | SUBCUTANEOUS | Status: DC
  Administered 2016-05-11 – 2016-05-13 (×3): 40 mg via SUBCUTANEOUS
  Filled 2016-05-11 (×5): qty 0.4

## 2016-05-11 MED ORDER — INSULIN ASPART 100 UNIT/ML ~~LOC~~ SOLN
0.0000 [IU] | SUBCUTANEOUS | Status: DC
  Administered 2016-05-11 (×2): 4 [IU] via SUBCUTANEOUS
  Administered 2016-05-12 (×2): 2 [IU] via SUBCUTANEOUS

## 2016-05-11 MED ORDER — INSULIN DETEMIR 100 UNIT/ML ~~LOC~~ SOLN: 20.0000 [IU] | Freq: Every day | SUBCUTANEOUS | Status: DC

## 2016-05-11 MED ORDER — INSULIN ASPART 100 UNIT/ML ~~LOC~~ SOLN
0.0000 [IU] | SUBCUTANEOUS | Status: DC
Start: 2016-05-11 — End: 2016-05-11

## 2016-05-11 MED FILL — Potassium Chloride Inj 2 mEq/ML: INTRAVENOUS | Qty: 40 | Status: AC

## 2016-05-11 MED FILL — Heparin Sodium (Porcine) Inj 1000 Unit/ML: INTRAMUSCULAR | Qty: 10 | Status: AC

## 2016-05-11 MED FILL — Sodium Chloride IV Soln 0.9%: INTRAVENOUS | Qty: 2000 | Status: AC

## 2016-05-11 MED FILL — Mannitol IV Soln 20%: INTRAVENOUS | Qty: 500 | Status: AC

## 2016-05-11 MED FILL — Lidocaine HCl IV Inj 20 MG/ML: INTRAVENOUS | Qty: 15 | Status: AC

## 2016-05-11 MED FILL — Heparin Sodium (Porcine) Inj 1000 Unit/ML: INTRAMUSCULAR | Qty: 30 | Status: AC

## 2016-05-11 MED FILL — Electrolyte-R (PH 7.4) Solution: INTRAVENOUS | Qty: 6000 | Status: AC

## 2016-05-11 MED FILL — Sodium Bicarbonate IV Soln 8.4%: INTRAVENOUS | Qty: 50 | Status: AC

## 2016-05-11 MED FILL — Magnesium Sulfate Inj 50%: INTRAMUSCULAR | Qty: 10 | Status: AC

## 2016-05-11 NOTE — Plan of Care (Signed)
Problem: Cardiac: Goal: Hemodynamic stability will improve Outcome: Progressing Hemodynamics maintained by milrinone and ntg

## 2016-05-11 NOTE — Anesthesia Postprocedure Evaluation (Signed)
Anesthesia Post Note  Patient: Albert Carter  Procedure(s) Performed: Procedure(s) (LRB): CORONARY ARTERY BYPASS GRAFTING (CABG) x four , using left internal mammary artery and right leg greater saphenous vein harvested endoscopically, and Cryo saphenous vein (N/A) TRANSESOPHAGEAL ECHOCARDIOGRAM (TEE) (N/A)  Patient location during evaluation: SICU Anesthesia Type: General Level of consciousness: sedated Pain management: pain level controlled Vital Signs Assessment: post-procedure vital signs reviewed and stable Respiratory status: patient remains intubated per anesthesia plan Cardiovascular status: stable Anesthetic complications: no       Last Vitals:  Vitals:   05/11/16 1415 05/11/16 1430  BP: (!) 130/94 129/71  Pulse: 83 75  Resp:    Temp:      Last Pain:  Vitals:   05/11/16 1409  TempSrc:   PainSc: Asleep                 Zamariya Neal S

## 2016-05-11 NOTE — Progress Notes (Signed)
      [address]       [place] [address]             [phone]      POD # 1 CABG  BP 111/70   Pulse 68   Temp 97.8 F (36.6 C) (Oral)   Resp 20   Ht 5\' 8"  (1.727 m)   Wt 268 lb 4.8 oz (121.7 kg)   SpO2 99%   BMI 40.79 kg/m    Intake/Output Summary (Last 24 hours) at 05/11/16 1701 Last data filed at 05/11/16 1500  Gross per 24 hour  Intake          1507.43 ml  Output             2105 ml  Net          -597.57 ml   CBG modestly elevated PM labs pending  Remo Lipps C. Roxan Hockey, MD [facility] [phone]

## 2016-05-11 NOTE — Care Management Note (Signed)
Case Management Note  Patient Details  Name: Albert Carter MRN: [id number] Date of Birth: 28-Jul-1945  Subjective/Objective:  Per pt, he lives with his wife, adult children, and their spouses (total of 8).  He states he will always have someone with him 24/7.  Has a wheelchair but no other equipment.                         Expected Discharge Plan:  [place]  Discharge planning Services  CM Consult  Status of Service:  In process, will continue to follow  Girard Cooter, RN 05/11/2016, 10:48 AM

## 2016-05-11 NOTE — Progress Notes (Addendum)
Albert Carter [address]       [place] [address]             [phone]      1 Day Post-Op Procedure(s) (LRB): CORONARY ARTERY BYPASS GRAFTING (CABG) x four , using left internal mammary artery and right leg greater saphenous vein harvested endoscopically, and Cryo saphenous vein (N/A) TRANSESOPHAGEAL ECHOCARDIOGRAM (TEE) (N/A)   Subjective:  Patient states he feels sick this morning.  He states the pain is ok.    Objective: Vital signs in last 24 hours: Temp:  [97 F (36.1 C)-97.9 F (36.6 C)] 97.7 F (36.5 C) (02/28 0700) Pulse Rate:  [65-88] 65 (02/28 0700) Cardiac Rhythm: Atrial paced (02/27 2000) Resp:  [12-28] 18 (02/28 0700) BP: (101-141)/(67-82) 141/71 (02/28 0700) SpO2:  [93 %-100 %] 100 % (02/28 0700) Arterial Line BP: (94-177)/(51-74) 171/67 (02/28 0700) FiO2 (%):  [40 %-50 %] 40 % (02/27 1752) Weight:  [268 lb 4.8 oz (121.7 kg)] 268 lb 4.8 oz (121.7 kg) (02/28 0500)  Hemodynamic parameters for last 24 hours: PAP: (12-30)/(7-21) 22/20 CO:  [3.7 L/min-6.6 L/min] 4.4 L/min CI:  [1.6 L/min/m2-2.9 L/min/m2] 1.9 L/min/m2  Intake/Output from previous day: 02/27 0701 - 02/28 0700 In: 9023.3 [I.V.:5499.3; Blood:583; IV Piggyback:2941] Out: [id number] [Urine:4050; Drains:40; Blood:1550; Chest Tube:520] Intake/Output this shift: Total I/O In: -  Out: 60 [Chest Tube:60]  General appearance: alert, cooperative and no distress Heart: regular rate and rhythm Lungs: clear to auscultation bilaterally Abdomen: soft, non-tender; bowel sounds normal; no masses,  no organomegaly Extremities: edema trace Wound: clean and dry, ecchymosis of RLE, aquacel in place on sternotomy  Lab Results:  Recent Labs  05/10/16 2002 05/10/16 2011 05/11/16 0444  WBC 12.1*  --  12.4*  HGB 12.1* 12.6* 12.0*  HCT 36.2* 37.0* 36.0*  PLT 137*  --  149*   BMET:  Recent Labs  05/10/16 2011 05/11/16 0444  NA 140 137  K 3.6 3.9  CL 107 108  CO2  --  23  GLUCOSE 168* 95    BUN 12 11  CREATININE 0.60* 0.74  CALCIUM  --  8.1*    PT/INR:  Recent Labs  05/10/16 1436  LABPROT 18.0*  INR 1.47   ABG    Component Value Date/Time   PHART 7.319 (L) 05/10/2016 2005   HCO3 22.0 05/10/2016 2005   TCO2 24 05/10/2016 2011   ACIDBASEDEF 4.0 (H) 05/10/2016 2005   O2SAT 95.0 05/10/2016 2005   CBG (last 3)   Recent Labs  05/11/16 0121 05/11/16 0233 05/11/16 0404  GLUCAP 101* 95 102*    Assessment/Plan: S/P Procedure(s) (LRB): CORONARY ARTERY BYPASS GRAFTING (CABG) x four , using left internal mammary artery and right leg greater saphenous vein harvested endoscopically, and Cryo saphenous vein (N/A) TRANSESOPHAGEAL ECHOCARDIOGRAM (TEE) (N/A)  1. CV- NSR, HTN- wean miirinone as tolerated, continue Lopressor, add Lisinopril 2. Pulm- wean oxygen, atelectasis bilaterally on CXR, using incentive this morning 3. Renal- creatinine stable at 0.74, weight is mildly elevated... Start Lasix 4. Expected post operative blood loss anemia- Hgb stable at 12.0 5. Borderline DM- cbgs controlled, will start Levemir to wean off insulin drip 6. Dispo- patient stable, add lisinopril for HTN, will diuresis, start insulin to wean drip, leave JP drain in leg one more day about 50 cc output overnight... Chest tube output around 520 since surgery... Will defer to Dr. Prescott Gum on removal.... POD #1 progression orders   LOS: 1 day    BARRETT,  ERIN 05/11/2016 Stable after CABG Will leave chest tubes and leg drain another day mobilize OOB and iv lasix diuresis Resume preop Htn meds as needed levimir plus SSI for glucose control

## 2016-05-12 ENCOUNTER — Inpatient Hospital Stay (HOSPITAL_COMMUNITY): Payer: Medicare Other

## 2016-05-12 LAB — GLUCOSE, CAPILLARY
Glucose-Capillary: 144 mg/dL — ABNORMAL HIGH (ref 65–99)
Glucose-Capillary: 138 mg/dL — ABNORMAL HIGH (ref 65–99)
Glucose-Capillary: 147 mg/dL — ABNORMAL HIGH (ref 65–99)
Glucose-Capillary: 130 mg/dL — ABNORMAL HIGH (ref 65–99)
Glucose-Capillary: 107 mg/dL — ABNORMAL HIGH (ref 65–99)

## 2016-05-12 LAB — BASIC METABOLIC PANEL
ANION GAP: 4 — AB (ref 5–15)
BUN: 10 mg/dL (ref 6–20)
CHLORIDE: 100 mmol/L — AB (ref 101–111)
CO2: 29 mmol/L (ref 22–32)
Calcium: 8.4 mg/dL — ABNORMAL LOW (ref 8.9–10.3)
Creatinine, Ser: 0.73 mg/dL (ref 0.61–1.24)
GFR calc Af Amer: >60 mL/min (ref >60)
GFR calc non Af Amer: >60 mL/min (ref >60)
GLUCOSE: 154 mg/dL — AB (ref 65–99)
POTASSIUM: 3.8 mmol/L (ref 3.5–5.1)
Sodium: 133 mmol/L — ABNORMAL LOW (ref 135–145)

## 2016-05-12 LAB — COOXEMETRY PANEL
Carboxyhemoglobin: 1.6 % — ABNORMAL HIGH (ref 0.5–1.5)
Methemoglobin: 1 % (ref 0.0–1.5)
O2 Saturation: 59.6 %
Total hemoglobin: 12.7 g/dL (ref 12.0–16.0)

## 2016-05-12 LAB — CBC
HEMATOCRIT: 38.1 % — AB (ref 39.0–52.0)
HEMOGLOBIN: 12.3 g/dL — AB (ref 13.0–17.0)
MCH: 30.6 pg (ref 26.0–34.0)
MCHC: 32.3 g/dL (ref 30.0–36.0)
MCV: 94.8 fL (ref 78.0–100.0)
Platelets: 143 10*3/uL — ABNORMAL LOW (ref 150–400)
RBC: 4.02 MIL/uL — ABNORMAL LOW (ref 4.22–5.81)
RDW: 14.3 % (ref 11.5–15.5)
WBC: 14.7 10*3/uL — ABNORMAL HIGH (ref 4.0–10.5)

## 2016-05-12 MED ORDER — SODIUM CHLORIDE 0.9% FLUSH
3.0000 mL | Freq: Two times a day (BID) | INTRAVENOUS | Status: DC
  Administered 2016-05-12 – 2016-05-16 (×7): 3 mL via INTRAVENOUS

## 2016-05-12 MED ORDER — MAGNESIUM HYDROXIDE 400 MG/5ML PO SUSP: 30.0000 mL | Freq: Every day | ORAL | Status: DC | PRN

## 2016-05-12 MED ORDER — POTASSIUM CHLORIDE CRYS ER 20 MEQ PO TBCR
40.0000 meq | EXTENDED_RELEASE_TABLET | Freq: Every day | ORAL | Status: DC
  Administered 2016-05-12: 40 meq via ORAL
  Filled 2016-05-12: qty 2

## 2016-05-12 MED ORDER — MOVING RIGHT ALONG BOOK
Freq: Once | Status: AC
  Administered 2016-05-12: 09:00:00
  Filled 2016-05-12: qty 1

## 2016-05-12 MED ORDER — POTASSIUM CHLORIDE CRYS ER 20 MEQ PO TBCR: 20.0000 meq | EXTENDED_RELEASE_TABLET | Freq: Every day | ORAL | Status: DC

## 2016-05-12 MED ORDER — SODIUM CHLORIDE 0.9% FLUSH
3.0000 mL | INTRAVENOUS | Status: DC | PRN
Start: 2016-05-12 — End: 2016-05-17

## 2016-05-12 MED ORDER — METOCLOPRAMIDE HCL 5 MG/ML IJ SOLN
10.0000 mg | Freq: Four times a day (QID) | INTRAMUSCULAR | Status: DC
  Administered 2016-05-12 – 2016-05-13 (×5): 10 mg via INTRAVENOUS
  Filled 2016-05-12 (×7): qty 2

## 2016-05-12 MED ORDER — SODIUM CHLORIDE 0.9 % IV SOLN
250.0000 mL | INTRAVENOUS | Status: DC | PRN
Start: 2016-05-12 — End: 2016-05-17

## 2016-05-12 MED ORDER — FUROSEMIDE 40 MG PO TABS
40.0000 mg | ORAL_TABLET | Freq: Every day | ORAL | Status: DC
  Administered 2016-05-12 – 2016-05-17 (×6): 40 mg via ORAL
  Filled 2016-05-12 (×7): qty 1

## 2016-05-12 MED ORDER — INSULIN ASPART 100 UNIT/ML ~~LOC~~ SOLN
0.0000 [IU] | Freq: Three times a day (TID) | SUBCUTANEOUS | Status: DC
  Administered 2016-05-12 (×2): 2 [IU] via SUBCUTANEOUS

## 2016-05-12 NOTE — Progress Notes (Addendum)
T[facility] DAILY ICU PROGRESS NOTE                   [address]            [place] [address]          [phone]   2 Days Post-Op Procedure(s) (LRB): CORONARY ARTERY BYPASS GRAFTING (CABG) x four , using left internal mammary artery and right leg greater saphenous vein harvested endoscopically, and Cryo saphenous vein (N/A) TRANSESOPHAGEAL ECHOCARDIOGRAM (TEE) (N/A)  Total Length of Stay:  LOS: 2 days   Subjective:  Patient states slept all night.  Complains of N/V.  States he gets dizzy if he closes his eyes to rest.  He moved his bowels yesterday.  + flatus  Objective: Vital signs in last 24 hours: Temp:  [97.3 F (36.3 C)-98.4 F (36.9 C)] 97.8 F (36.6 C) (03/01 0400) Pulse Rate:  [64-83] 64 (03/01 0700) Cardiac Rhythm: Normal sinus rhythm (03/01 0400) Resp:  [14-26] 14 (03/01 0700) BP: (108-172)/(61-99) 138/94 (03/01 0700) SpO2:  [92 %-100 %] 94 % (03/01 0700) Arterial Line BP: (144-166)/(62-88) 144/88 (02/28 0830) Weight:  [265 lb 14 oz (120.6 kg)] 265 lb 14 oz (120.6 kg) (03/01 0500)  Filed Weights   05/10/16 0605 05/11/16 0500 05/12/16 0500  Weight: 260 lb (117.9 kg) 268 lb 4.8 oz (121.7 kg) 265 lb 14 oz (120.6 kg)    Weight change: -2 lb 6.8 oz (-1.1 kg)   Intake/Output from previous day: 02/28 0701 - 03/01 0700 In: 1704.3 [P.O.:840; I.V.:364.3; IV Piggyback:500] Out: 2860 [Urine:2390; Drains:40; Chest Tube:430]  Intake/Output this shift: No intake/output data recorded.  Current Meds: Scheduled Meds: . acetaminophen  1,000 mg Oral Q6H   Or  . acetaminophen (TYLENOL) oral liquid 160 mg/5 mL  1,000 mg Per Tube Q6H  . aspirin EC  325 mg Oral Daily   Or  . aspirin  324 mg Per Tube Daily  . atorvastatin  80 mg Oral q1800  . bisacodyl  10 mg Oral Daily   Or  . bisacodyl  10 mg Rectal Daily  . docusate sodium  200 mg Oral Daily  . enoxaparin (LOVENOX) injection  40 mg Subcutaneous QHS  . furosemide  40 mg Oral Daily  . insulin aspart   0-24 Units Subcutaneous Q4H  . insulin detemir  20 Units Subcutaneous BID  . lisinopril  5 mg Oral Daily  . mouth rinse  15 mL Mouth Rinse BID  . metoCLOPramide (REGLAN) injection  10 mg Intravenous Q6H  . metoprolol tartrate  12.5 mg Oral BID   Or  . metoprolol tartrate  12.5 mg Per Tube BID  . pantoprazole  40 mg Oral Daily  . potassium chloride  40 mEq Oral Daily  . sodium chloride flush  3 mL Intravenous Q12H   Continuous Infusions: . sodium chloride 10 mL/hr at 05/11/16 0700  . sodium chloride    . sodium chloride Stopped (05/10/16 1900)  . lactated ringers Stopped (05/10/16 1900)  . lactated ringers 10 mL/hr at 05/12/16 0700  . milrinone 0.125 mcg/kg/min (05/12/16 0700)   PRN Meds:.sodium chloride, fentaNYL (SUBLIMAZE) injection, HYDROmorphone, metoprolol, ondansetron (ZOFRAN) IV, sodium chloride flush, traMADol  General appearance: alert, cooperative and no distress Heart: regular rate and rhythm Lungs: clear to auscultation bilaterally Abdomen: soft, non-tender; bowel sounds normal; no masses,  no organomegaly Extremities: edema trace Wound: clean and dry, aquacel dressing place  Lab Results: CBC: Recent Labs  05/11/16 1710 05/12/16 0430  WBC  16.2* 14.7*  HGB 12.4* 12.3*  HCT 38.1* 38.1*  PLT 160 143*   BMET:  Recent Labs  05/11/16 0444 05/11/16 1703 05/11/16 1710 05/12/16 0430  NA 137 134*  --  133*  K 3.9 4.0  --  3.8  CL 108 98*  --  100*  CO2 23  --   --  29  GLUCOSE 95 234*  --  154*  BUN 11 12  --  10  CREATININE 0.74 0.70 0.82 0.73  CALCIUM 8.1*  --   --  8.4*    CMET: Lab Results  Component Value Date   WBC 14.7 (H) 05/12/2016   HGB 12.3 (L) 05/12/2016   HCT 38.1 (L) 05/12/2016   PLT 143 (L) 05/12/2016   GLUCOSE 154 (H) 05/12/2016   CHOL 202 (H) 04/23/2016   TRIG 337 (H) 04/23/2016   HDL 37 (L) 04/23/2016   LDLCALC 98 04/23/2016   ALT 17 05/06/2016   AST 25 05/06/2016   NA 133 (L) 05/12/2016   K 3.8 05/12/2016   CL 100 (L)  05/12/2016   CREATININE 0.73 05/12/2016   BUN 10 05/12/2016   CO2 29 05/12/2016   INR 1.47 05/10/2016   HGBA1C 6.3 (H) 05/06/2016      PT/INR:  Recent Labs  05/10/16 1436  LABPROT 18.0*  INR 1.47   Radiology: Dg Chest Port 1 View  Result Date: 05/12/2016 CLINICAL DATA:  Status post CABG 2 days ago EXAM: PORTABLE CHEST 1 VIEW COMPARISON:  Portable chest x-ray 8 May 11, 2016. FINDINGS: There has been removal of the Swan-Ganz catheter. The right internal jugular Cordis sheath remains. The cardiac silhouette remains enlarged. The pulmonary vascularity is mildly prominent. There is no pleural effusion or pneumothorax. There is minimal bibasilar subsegmental atelectasis which is stable. The left-sided chest tube is unchanged in appearance. IMPRESSION: Low-grade CHF. No pneumothorax or significant pleural effusion. Persistent bibasilar subsegmental atelectasis. Electronically Signed   By: David  Martinique M.D.   On: 05/12/2016 07:32     Assessment/Plan: S/P Procedure(s) (LRB): CORONARY ARTERY BYPASS GRAFTING (CABG) x four , using left internal mammary artery and right leg greater saphenous vein harvested endoscopically, and Cryo saphenous vein (N/A) TRANSESOPHAGEAL ECHOCARDIOGRAM (TEE) (N/A)  1. CV- Sinus Brady- Co-OX 59.6- on Milrinone at 0.125, wean per Dr. Prescott Gum- continue low dose BB, Lisinopril 2. Pulm- chest tube 420, will remove today... CXR free from effusion, bilateral atelectasis, good use of IS 3. Renal- creatinine stable at 0.73, K down to 3.8- will transition lasix to 40 mg daily weight up 5 lbs from admission, supplement K 4. GI- N/V on zofran prn, will add scheduled reglan for additional relief 5. Expected post operative anemia- Hgb stable at 12.3 6. Thrombocytopenia stable 7. Dm- sugars controlled, continue current insulin regimen 8. Dispo- patient stable, wean milrinone per PVT, add reglan for N/V, d/c chest tubes, Jp drain, foley catheter, diuresis, continue current  care     BARRETT, ERIN 05/12/2016 7:43 AM      Agree with above assessment Nausea prob medication relate[narcotic] DC milrinone and tx stepdown patient examined and medical record reviewed,agree with above note. Tharon Aquas Trigt III 05/12/2016

## 2016-05-12 NOTE — Progress Notes (Signed)
CARDIAC REHAB PHASE I   PRE:  Rate/Rhythm: 68 SR    BP: sitting 141/67    SaO2: 96 RA  MODE:  Ambulation: 150 ft   POST:  Rate/Rhythm: 74 SR    BP: sitting 163/71     SaO2: 98 RA  Pt c/o sternal pain 8/10, asking for pain meds. He was able to stand fairly independently and walk with RW. C/o pain and did not want to walk far.  To bed and pt falling asleep immediately. Encouraged x1 more walk this pm. [id number]  [place], ACSM 05/12/2016 3:22 PM

## 2016-05-12 NOTE — Progress Notes (Signed)
Patient arrived on the unit from 2S on a wheelchair, assessment completed see flowsheet, patient oriented to room and staff, placed on tele ccmd notified, patient sitting on his recliner, call bell with reach , no c/o pain at this time, will continue to monitor.

## 2016-05-13 ENCOUNTER — Inpatient Hospital Stay (HOSPITAL_COMMUNITY): Payer: Medicare Other

## 2016-05-13 LAB — BASIC METABOLIC PANEL
Anion gap: 8 (ref 5–15)
BUN: 11 mg/dL (ref 6–20)
CHLORIDE: 95 mmol/L — AB (ref 101–111)
CO2: 29 mmol/L (ref 22–32)
CREATININE: 0.85 mg/dL (ref 0.61–1.24)
Calcium: 8.8 mg/dL — ABNORMAL LOW (ref 8.9–10.3)
GFR calc Af Amer: >60 mL/min (ref >60)
GFR calc non Af Amer: >60 mL/min (ref >60)
GLUCOSE: 108 mg/dL — AB (ref 65–99)
POTASSIUM: 5.1 mmol/L (ref 3.5–5.1)
Sodium: 132 mmol/L — ABNORMAL LOW (ref 135–145)

## 2016-05-13 LAB — CBC
HEMATOCRIT: 37.3 % — AB (ref 39.0–52.0)
HEMOGLOBIN: 12.3 g/dL — AB (ref 13.0–17.0)
MCH: 31.5 pg (ref 26.0–34.0)
MCHC: 33 g/dL (ref 30.0–36.0)
MCV: 95.6 fL (ref 78.0–100.0)
Platelets: 147 10*3/uL — ABNORMAL LOW (ref 150–400)
RBC: 3.9 MIL/uL — AB (ref 4.22–5.81)
RDW: 14.6 % (ref 11.5–15.5)
WBC: 13.8 10*3/uL — ABNORMAL HIGH (ref 4.0–10.5)

## 2016-05-13 LAB — GLUCOSE, CAPILLARY
Glucose-Capillary: 80 mg/dL (ref 65–99)
Glucose-Capillary: 103 mg/dL — ABNORMAL HIGH (ref 65–99)
Glucose-Capillary: 128 mg/dL — ABNORMAL HIGH (ref 65–99)

## 2016-05-13 MED ORDER — CLOPIDOGREL BISULFATE 75 MG PO TABS
75.0000 mg | ORAL_TABLET | Freq: Every day | ORAL | Status: DC
  Administered 2016-05-13 – 2016-05-17 (×5): 75 mg via ORAL
  Filled 2016-05-13 (×5): qty 1

## 2016-05-13 MED ORDER — LISINOPRIL 10 MG PO TABS
10.0000 mg | ORAL_TABLET | Freq: Every day | ORAL | Status: DC
  Administered 2016-05-13 – 2016-05-17 (×5): 10 mg via ORAL
  Filled 2016-05-13 (×5): qty 1

## 2016-05-13 MED ORDER — ASPIRIN EC 81 MG PO TBEC
81.0000 mg | DELAYED_RELEASE_TABLET | Freq: Every day | ORAL | Status: DC
  Administered 2016-05-13 – 2016-05-17 (×5): 81 mg via ORAL
  Filled 2016-05-13 (×5): qty 1

## 2016-05-13 NOTE — Discharge Instructions (Signed)
Coronary Artery Bypass Grafting, Care After ° °This sheet gives you information about how to care for yourself after your procedure. Your health care provider may also give you more specific instructions. If you have problems or questions, contact your health care provider. °What can I expect after the procedure? °After the procedure, it is common to have: °· Nausea and a lack of appetite. °· Constipation. °· Weakness and fatigue. °· Depression or irritability. °· Pain or discomfort in your incision areas. °Follow these instructions at home: °Medicines  °· Take over-the-counter and prescription medicines only as told by your health care provider. Do not stop taking medicines or start any new medicines without approval from your health care provider. °· If you were prescribed an antibiotic medicine, take it as told by your health care provider. Do not stop taking the antibiotic even if you start to feel better. °· Do not drive or use heavy machinery while taking prescription pain medicine. °Incision care  °· Follow instructions from your health care provider about how to take care of your incisions. Make sure you: °¨ Wash your hands with soap and water before you change your bandage (dressing). If soap and water are not available, use hand sanitizer. °¨ Change your dressing as told by your health care provider. °¨ Leave stitches (sutures), skin glue, or adhesive strips in place. These skin closures may need to stay in place for 2 weeks or longer. If adhesive strip edges start to loosen and curl up, you may trim the loose edges. Do not remove adhesive strips completely unless your health care provider tells you to do that. °· Keep incision areas clean, dry, and protected. °· Check your incision areas every day for signs of infection. Check for: °¨ More redness, swelling, or pain. °¨ More fluid or blood. °¨ Warmth. °¨ Pus or a bad smell. °· If incisions were made in your legs: °¨ Avoid crossing your legs. °¨ Avoid  sitting for long periods of time. Change positions every 30 minutes. °¨ Raise (elevate) your legs when you are sitting. °Bathing  °· Do not take baths, swim, or use a hot tub until your health care provider approves. °· Only take sponge baths. Pat the incisions dry. Do not rub incisions with a washcloth or towel. °· Ask your health care provider when you can shower. °Eating and drinking  °· Eat foods that are high in fiber, such as raw fruits and vegetables, whole grains, beans, and nuts. Meats should be lean cut. Avoid canned, processed, and fried foods. This can help prevent constipation and is a recommended part of a heart-healthy diet. °· Drink enough fluid to keep your urine clear or pale yellow. °· Limit alcohol intake to no more than 1 drink a day for nonpregnant women and 2 drinks a day for men. One drink equals 12 oz of beer, 5 oz of wine, or 1½ oz of hard liquor. °Activity  °· Rest and limit your activity as told by your health care provider. You may be instructed to: °¨ Stop any activity right away if you have chest pain, shortness of breath, irregular heartbeats, or dizziness. Get help right away if you have any of these symptoms. °¨ Move around frequently for short periods or take short walks as directed by your health care provider. Gradually increase your activities. You may need physical therapy or cardiac rehabilitation to help strengthen your muscles and build your endurance. °¨ Avoid lifting, pushing, or pulling anything that is heavier than 10   4.5 kg) for at least 6 weeks or as told by your health care provider. °· Do not drive until your health care provider approves. °· Ask your health care provider when you may return to work. °· Ask your health care provider when you may resume sexual activity. °General instructions  °· Do not use any products that contain nicotine or tobacco, such as cigarettes and e-cigarettes. If you need help quitting, ask your health care provider. °· Take 2-3 deep  breaths every few hours during the day, while you recover. This helps expand your lungs and prevent complications like pneumonia after surgery. °· If you were given a device called an incentive spirometer, use it several times a day to practice deep breathing. Support your chest with a pillow or your arms when you take deep breaths or cough. °· Wear compression stockings as told by your health care provider. These stockings help to prevent blood clots and reduce swelling in your legs. °· Weigh yourself every day. This helps identify if your body is holding (retaining) fluid that may make your heart and lungs work harder. °· Keep all follow-up visits as told by your health care provider. This is important. °Contact a health care provider if: °· You have more redness, swelling, or pain around any incision. °· You have more fluid or blood coming from any incision. °· Any incision feels warm to the touch. °· You have pus or a bad smell coming from any incision °· You have a fever. °· You have swelling in your ankles or legs. °· You have pain in your legs. °· You gain 2 lb (0.9 kg) or more a day. °· You are nauseous or you vomit. °· You have diarrhea. °Get help right away if: °· You have chest pain that spreads to your jaw or arms. °· You are short of breath. °· You have a fast or irregular heartbeat. °· You notice a "clicking" in your breastbone (sternum) when you move. °· You have numbness or weakness in your arms or legs. °· You feel dizzy or light-headed. °Summary °· After the procedure, it is common to have pain or discomfort in the incision areas. °· Do not take baths, swim, or use a hot tub until your health care provider approves. °· Gradually increase your activities. You may need physical therapy or cardiac rehabilitation to help strengthen your muscles and build your endurance. °· Weigh yourself every day. This helps identify if your body is holding (retaining) fluid that may make your heart and lungs work  harder. °This information is not intended to replace advice given to you by your health care provider. Make sure you discuss any questions you have with your health care provider. °Document Released: 09/17/2004 Document Revised: 01/18/2016 Document Reviewed: 01/18/2016 °Elsevier Interactive Patient Education © 2017 [employer] ° ° °Endoscopic Saphenous Vein Harvesting, Care After °Refer to this sheet in the next few weeks. These instructions provide you with information about caring for yourself after your procedure. Your health care provider may also give you more specific instructions. Your treatment has been planned according to current medical practices, but problems sometimes occur. Call your health care provider if you have any problems or questions after your procedure. °What can I expect after the procedure? °After the procedure, it is common to have: °· Pain. °· Bruising. °· Swelling. °· Numbness. °Follow these instructions at home: °Medicine  °· Take over-the-counter and prescription medicines only as told by your health care provider. °· Do not   drive or operate heavy machinery while taking prescription pain medicine. °Incision care  ° °· Follow instructions from your health care provider about how to take care of the cut made during surgery (incision). Make sure you: °¨ Wash your hands with soap and water before you change your bandage (dressing). If soap and water are not available, use hand sanitizer. °¨ Change your dressing as told by your health care provider. °¨ Leave stitches (sutures), skin glue, or adhesive strips in place. These skin closures may need to be in place for 2 weeks or longer. If adhesive strip edges start to loosen and curl up, you may trim the loose edges. Do not remove adhesive strips completely unless your health care provider tells you to do that. °· Check your incision area every day for signs of infection. Check for: °¨ More redness, swelling, or pain. °¨ More fluid or  blood. °¨ Warmth. °¨ Pus or a bad smell. °General instructions  °· Raise (elevate) your legs above the level of your heart while you are sitting or lying down. °· Do any exercises your health care providers have given you. These may include deep breathing, coughing, and walking exercises. °· Do not shower, take baths, swim, or use a hot tub unless told by your health care provider. °· Wear your elastic stocking if told by your health care provider. °· Keep all follow-up visits as told by your health care provider. This is important. °Contact a health care provider if: °· Medicine does not help your pain. °· Your pain gets worse. °· You have new leg bruises or your leg bruises get bigger. °· You have a fever. °· Your leg feels numb. °· You have more redness, swelling, or pain around your incision. °· You have more fluid or blood coming from your incision. °· Your incision feels warm to the touch. °· You have pus or a bad smell coming from your incision. °Get help right away if: °· Your pain is severe. °· You develop pain, tenderness, warmth, redness, or swelling in any part of your leg. °· You have chest pain. °· You have trouble breathing. °This information is not intended to replace advice given to you by your health care provider. Make sure you discuss any questions you have with your health care provider. °Document Released: 11/10/2010 Document Revised: 08/06/2015 Document Reviewed: 01/12/2015 °Elsevier Interactive Patient Education © 2017 Elsevier Inc. ° ° °

## 2016-05-13 NOTE — Progress Notes (Signed)
Patient ambulated 341ft  twice in the hallway using a rolling walker on room air, ambulation well tolerated, patient now resting quietly in his recliner, call bell within reach will continue to monitor.

## 2016-05-13 NOTE — Consult Note (Signed)
   [facility] CM Inpatient Consult   05/13/2016  KEYSHAUN HOFACKER 1945-11-22 [id number]   Patient screened for potential [place] [facility] services. Patient is seen for [place] [facility] services under patient's [place].  Patient is a s/p CABG.  Chart review reveals the patient is a 71 year old obese Caucasian male was admitted to the hospital with unstable angina and non-ST elevation MI 2 weeks ago. Cardiac catheterization demonstrated severe three-vessel coronary disease and he was recommended for CABG. The patient refused to remain hospitalized and left AGAINST MEDICAL ADVICE. He was prescribed medications including a beta blocker, aspirin, statin per MD surgical consult note.  Came by to see the patient but was busy at the time.  Please place a [facility] Care Management consult or for questions contact:   Natividad Brood, RN BSN [facility] Liaison  [phone] business mobile phone Toll free office [phone]

## 2016-05-13 NOTE — Progress Notes (Addendum)
      [address]       [place] [address]             [phone]      3 Days Post-Op Procedure(s) (LRB): CORONARY ARTERY BYPASS GRAFTING (CABG) x four , using left internal mammary artery and right leg greater saphenous vein harvested endoscopically, and Cryo saphenous vein (N/A) TRANSESOPHAGEAL ECHOCARDIOGRAM (TEE) (N/A)   Subjective:  No new complaints.  Continues to have nausea with use of pain medication.  He states he is not vomiting.  I encouraged him to take tylenol for pain and try avoid narcotics if possible.  +ambulation  + BM  Objective: Vital signs in last 24 hours: Temp:  [97.4 F (36.3 C)-98.8 F (37.1 C)] 98 F (36.7 C) (03/02 0513) Pulse Rate:  [65-97] 65 (03/02 0513) Cardiac Rhythm: Normal sinus rhythm (03/01 2030) Resp:  [15-21] 18 (03/02 0513) BP: (96-153)/(45-125) 124/60 (03/02 0513) SpO2:  [92 %-100 %] 100 % (03/02 0513) Weight:  [264 lb 6.4 oz (119.9 kg)] 264 lb 6.4 oz (119.9 kg) (03/02 0513)  Intake/Output from previous day: 03/01 0701 - 03/02 0700 In: 500 [P.O.:240; I.V.:260] Out: 575 [Urine:550; Drains:25]  General appearance: alert, cooperative and no distress Heart: regular rate and rhythm Lungs: clear to auscultation bilaterally Abdomen: soft, non-tender; bowel sounds normal; no masses,  no organomegaly Extremities: edema trace Wound: clean and dry, eccymosis of RLE  Lab Results:  Recent Labs  05/12/16 0430 05/13/16 0321  WBC 14.7* 13.8*  HGB 12.3* 12.3*  HCT 38.1* 37.3*  PLT 143* 147*   BMET:  Recent Labs  05/12/16 0430 05/13/16 0321  NA 133* 132*  K 3.8 5.1  CL 100* 95*  CO2 29 29  GLUCOSE 154* 108*  BUN 10 11  CREATININE 0.73 0.85  CALCIUM 8.4* 8.8*    PT/INR:  Recent Labs  05/10/16 1436  LABPROT 18.0*  INR 1.47   ABG    Component Value Date/Time   PHART 7.319 (L) 05/10/2016 2005   HCO3 22.0 05/10/2016 2005   TCO2 23 05/11/2016 1703   ACIDBASEDEF 4.0 (H) 05/10/2016 2005   O2SAT 59.6  05/12/2016 0405   CBG (last 3)   Recent Labs  05/12/16 1612 05/12/16 2116 05/13/16 0618  GLUCAP 130* 107* 80    Assessment/Plan: S/P Procedure(s) (LRB): CORONARY ARTERY BYPASS GRAFTING (CABG) x four , using left internal mammary artery and right leg greater saphenous vein harvested endoscopically, and Cryo saphenous vein (N/A) TRANSESOPHAGEAL ECHOCARDIOGRAM (TEE) (N/A)  1. CV- Sinus Loletha Grayer, remains hypertensive- will increase Lisinopril to 10 mg daily... Will start Plavix for cryovein 2. Pulm- no acute issues, off oxygen, continue IS 3. Renal- creatinine stable, weight is trending down.... K is at 5.1 will continue Lasix, hold potassium supplementation for now 4. DM- cbgs controlled, borderline DM will need close follow up with PCP... Stop all insulin 5. Dispo- patient stable, will d/c EPW, increase Lisinopril for better BP control, add Plavix for cryovein, continue current care   LOS: 3 days    BARRETT, ERIN 05/13/2016   patient examined and medical record reviewed,agree with above note. Tharon Aquas Trigt III 05/13/2016

## 2016-05-13 NOTE — Progress Notes (Signed)
CARDIAC REHAB PHASE I   PRE:  Rate/Rhythm: 78 SR    BP: sitting 120/65    SaO2: 95 RA  MODE:  Ambulation: 550 ft   POST:  Rate/Rhythm: 84 SR    BP: sitting 155/76     SaO2: 95 RA  Pt is feeling much better today. Sts minimal pain. Able to stand with rocking and walk increased distance with RW. Likes RW and would like one for home. Return to recliner. Encouraged more IS and x2 more walks. Can walk independently with RW. [id number]  Albert Carter, ACSM 05/13/2016 11:02 AM

## 2016-05-13 NOTE — Care Management Important Message (Signed)
Important Message  Patient Details  Name: Albert Carter MRN: [id number] Date of Birth: 1946/03/13   Medicare Important Message Given:  Yes    Nathen May 05/13/2016, 12:37 PM

## 2016-05-13 NOTE — Discharge Summary (Addendum)
Physician Discharge Summary  Patient ID: Albert Carter MRN: [id number] DOB/AGE: Aug 20, 1945 71 y.o.  Admit date: 05/10/2016 Discharge date: 05/17/2016  Admission Diagnoses:  Patient Active Problem List   Diagnosis Date Noted  . Hypertension 05/03/2016  . CAD (coronary artery disease), severe with CABG recommended.  04/25/2016  . Hyperlipemia 04/25/2016  . NSTEMI (non-ST elevated myocardial infarction) ([place]) 04/23/2016  . Upper back pain 04/23/2016  . Hypertensive urgency 04/23/2016  . Ventricular tachycardia, nonsustained ([place]) 04/23/2016   Discharge Diagnoses:   Patient Active Problem List   Diagnosis Date Noted  . 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 Hypertension 05/03/2016  . CAD (coronary artery disease), severe with CABG recommended.  04/25/2016  . Hyperlipemia 04/25/2016  . NSTEMI (non-ST elevated myocardial infarction) ([place]) 04/23/2016  . Upper back pain 04/23/2016  . Hypertensive urgency 04/23/2016  . Ventricular tachycardia, nonsustained ([place]) 04/23/2016   Discharged Condition: good  History of Present Illness:  Albert Carter is a 71 yo obese white male with history of Asthma, Hyperlipidemia, Borderline diabetes, and CAD.  He was originally admitted to the hospital in early February.  At that time he presented with unstable angina symptoms and was ruled in for a NSTEMI.  He underwent cardiac catheterization by Dr. Claiborne Billings on 04/25/16 which demonstrated severe 3 vessel CAD.  It was recommended he undergo coronary bypass grafting however the patient refused surgery and signed out AMA.  He was given prescriptions at that time, but continued to experience chest pain and presented to the ED on 2 separate occasions.  His most recent presentation the patient decided to proceed with coronary bypass grafting and admitted to the hospital.  He was evaluated by Dr. Prescott Gum who explained the risks and benefits to the patient and he was agreeable to proceed.  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   Hospital Course:   He was taken to the operating room on 05/10/2016.  He underwent CABG x 4 utilizing LIMA to LAD, SVG to OM, SVG to PDA, and Cryo vein to PDA.  He also underwent endoscopic harvest of the greater saphenous vein from his right leg.  He tolerated the procedure without difficulty and was taken to the SICU in stable condition.  He was extubated the evening of surgery.  During his stay in the SICU he made good progress.  His post operative course was complicate by post operative nausea/vomiting associated with pain medication use.  He was treated with anti-emetics, but he was still able to tolerate oral intake without difficulty.  He was mildly hypertensive and started on Lisinopril on POD #1.  He was weaned off Milrinone on POD #2.  His chest tubes, arterial lines, and JP drain from his right leg were also removed.  He was maintaining NSR and transferred to the stepdown unit for further care.  The patient continued to make progress.  He continues to maintain Sinus Bradycardia on low dose BB.  He remained hypertensive and his Lisinopril was titrated.  He was started on Plavix on POD #3 for his cryovein used for his bypass surgery.  He will need to be on this medication for 30 days.  He was mildly volume overloaded and responded well to diuretics.  His nausea improved with limited intake of pain medications.  He was ambulating independently.  His pacing wires have been removed.  His surgical wounds are healing without evidence  of infection.  He does have some minor fat necrosis along the inferior aspect of his sternotomy.  He will be treated with Keflex for this empirically.  He is felt medically stable for discharge home today.  Significant Diagnostic Studies: angiography:    Ost LM to LM lesion, 40 %stenosed.  Ost 1st Diag lesion, 50 %stenosed.  Mid LAD-2 lesion, 70  %stenosed.  Mid LAD-1 lesion, 30 %stenosed.  Dist LAD-2 lesion, 90 %stenosed.  Dist LAD-1 lesion, 80 %stenosed.  Ost Cx to Prox Cx lesion, 70 %stenosed.  Dist Cx lesion, 95 %stenosed.  Prox RCA lesion, 30 %stenosed.  2nd RPLB lesion, 55 %stenosed.  The left ventricular ejection fraction is 50-55% by visual estimate.  The left ventricular systolic function is normal.  There is a 95% very distal near apical LAD stenosis   Low-normal global ejection fraction with focal distal inferior hypocontractility and an ejection fraction of 50-55%.  Severe multivessel CAD with 40% ostial narrowing of the left main; diffuse multiple LAD stenoses of 30, 70 and 70% proximally, 80 and 90% in the mid segment, and 95% distal-apical with 50% first diagonal stenosis; 70% ostial stenosis in the left circumflex vessel with 95% mid AV groove stenosis prior to bifurcating into a large OM branch; and 30% proximal RCA with 50-60% ostial PLA stenosis.  Treatments: surgery:   1. Coronary artery bypass grafting (left internal mammary artery to left anterior descending artery,  Cryopreserved saphenous vein graft to diagonal, saphenous vein graft to circumflex marginal, saphenous vein graft to posterior descending). 2. Endoscopic harvest of right leg greater saphenous vein.  Disposition: 01-Home or Self Care   Discharge Medications:  The patient has been discharged on:   1.Beta Blocker:  Yes [  x ]                              No   [   ]                              If No, reason:  2.Ace Inhibitor/ARB: Yes [x   ]                                     No  [    ]                                     If No, reason:  3.Statin:   Yes [x   ]                  No  [   ]                  If No, reason:  4.Ecasa:  Yes  [  x ]                  No   [   ]                  If No, reason:     Discharge Instructions    Amb Referral to Cardiac Rehabilitation    Complete by:  As directed    Diagnosis:  CABG    CABG X ___:  4 Comment -  lima to LAD     Allergies as of 05/17/2016      Reactions   Codeine Other (See Comments)   MAKES HIM SLEEP ALL TIME   Morphine And Related Nausea And Vomiting      Medication List    STOP taking these medications   isosorbide mononitrate 60 MG 24 hr tablet Commonly known as:  IMDUR   nitroGLYCERIN 0.4 MG SL tablet Commonly known as:  NITROSTAT     TAKE these medications   acetaminophen 325 MG tablet Commonly known as:  TYLENOL Take 2 tablets (650 mg total) by mouth every 4 (four) hours as needed for headache or mild pain.   aspirin 81 MG chewable tablet Chew 1 tablet (81 mg total) by mouth daily.   atorvastatin 80 MG tablet Commonly known as:  LIPITOR Take 1 tablet (80 mg total) by mouth daily at 6 PM.   cephALEXin 500 MG capsule Commonly known as:  KEFLEX Take 1 capsule (500 mg total) by mouth 3 (three) times daily.   clopidogrel 75 MG tablet Commonly known as:  PLAVIX Take 1 tablet (75 mg total) by mouth daily.   furosemide 40 MG tablet Commonly known as:  LASIX Take 1 tablet (40 mg total) by mouth daily. FOR 7 DAYS   lisinopril 10 MG tablet Commonly known as:  PRINIVIL,ZESTRIL Take 1 tablet (10 mg total) by mouth daily.   metoprolol tartrate 25 MG tablet Commonly known as:  LOPRESSOR Take 0.5 tablets (12.5 mg total) by mouth 2 (two) times daily.   potassium chloride SA 20 MEQ tablet Commonly known as:  K-DUR,KLOR-CON Take 1 tablet (20 mEq total) by mouth daily. For 7 Days   traMADol 50 MG tablet Commonly known as:  ULTRAM Take 1-2 tablets (50-100 mg total) by mouth every 4 (four) hours as needed for moderate pain.            Durable Medical Equipment        Start     Ordered   05/13/16 1143  For home use only DME Walker rolling  Once    Question:  Patient needs a walker to treat with the following condition  Answer:  Weakness   05/13/16 1142     Follow-up Information    Tharon Aquas Trigt III, MD Follow up on  06/15/2016.   Specialty:  Cardiothoracic Surgery Why:  Appointment is at 12:30, please get CXR at [facility] at 12:00 located on first floor of our office building Contact information: [place] [address] [phone]        Purvis Kilts, MD Follow up.   Specialty:  Family Medicine Why:  Close follow up for Pre diabetes Contact information: [address] [place] [id number] [phone]        Ermalinda Barrios, PA-C Follow up on 05/30/2016.   Specialty:  Cardiology Why:  Appointment is at 11:15 Contact information: [address] [place] [address] [phone]        [facility] Follow up.   Why:  For home health RN, they will contact you in 1-2 days to set up your first home nurse appointment.  Contact information: [place] [address] [phone]           Signed: Ellwood Handler 05/17/2016, 12:42 PM    patient examined and medical record reviewed,agree with above note. Ellwood Handler 05/17/2016

## 2016-05-14 LAB — CBC
HCT: 37.9 % — ABNORMAL LOW (ref 39.0–52.0)
Hemoglobin: 12.6 g/dL — ABNORMAL LOW (ref 13.0–17.0)
MCH: 31.3 pg (ref 26.0–34.0)
MCHC: 33.2 g/dL (ref 30.0–36.0)
MCV: 94 fL (ref 78.0–100.0)
Platelets: 198 10*3/uL (ref 150–400)
RBC: 4.03 MIL/uL — ABNORMAL LOW (ref 4.22–5.81)
RDW: 14.2 % (ref 11.5–15.5)
WBC: 12.5 10*3/uL — ABNORMAL HIGH (ref 4.0–10.5)

## 2016-05-14 LAB — BASIC METABOLIC PANEL
Anion gap: 10 (ref 5–15)
BUN: 13 mg/dL (ref 6–20)
CO2: 27 mmol/L (ref 22–32)
Calcium: 8.9 mg/dL (ref 8.9–10.3)
Chloride: 97 mmol/L — ABNORMAL LOW (ref 101–111)
Creatinine, Ser: 0.79 mg/dL (ref 0.61–1.24)
GFR calc Af Amer: >60 mL/min (ref >60)
GFR calc non Af Amer: >60 mL/min (ref >60)
Glucose, Bld: 138 mg/dL — ABNORMAL HIGH (ref 65–99)
Potassium: 3.9 mmol/L (ref 3.5–5.1)
Sodium: 134 mmol/L — ABNORMAL LOW (ref 135–145)

## 2016-05-14 MED ORDER — POTASSIUM CHLORIDE CRYS ER 20 MEQ PO TBCR
20.0000 meq | EXTENDED_RELEASE_TABLET | Freq: Every day | ORAL | Status: DC
  Administered 2016-05-14 – 2016-05-17 (×4): 20 meq via ORAL
  Filled 2016-05-14 (×4): qty 1

## 2016-05-14 NOTE — Progress Notes (Signed)
Patient education completed for removal of pacer wires, patient verbalised understanding, 4 pacer wires removed as ordered, patient now on bedrest, bed in lowest position, call bell within reach will continue to monitor

## 2016-05-14 NOTE — Progress Notes (Signed)
Patient ambulated three times in the hall independently using a rolling walker on room air, ambulation well tolerated will continue to monitor.

## 2016-05-14 NOTE — Progress Notes (Signed)
CARDIAC REHAB PHASE I   PRE:  Rate/Rhythm: 77 sr  BP:  Sitting: 117/75      SaO2: 98 ra  MODE:  Ambulation: 500 ft   POST:  Rate/Rhythm: 90 sr  BP:  Sitting: 119/74     SaO2: 97 ra 1240-1320 Patient ambulated in hallway independently with RW. Steady gait noted. Denied complaints. After walk patient back to room, to chair, with call bell and phone in reach. Discharge education completed. Surgery book reviewed along with diet and activity progression handouts. Spoke with wife via phone call and encouraged her to read all information provided prior to discharge and discuss with primary RN if questions arise. All verbalize understanding. If patient not discharged tomorrow will follow up with him on Monday. Patient requested referral for phase 2 cardiac rehab be sent to [facility]. Order placed.  Edlyn Rosenburg English PayneRN, BSN 05/14/2016 1:22 PM

## 2016-05-14 NOTE — Progress Notes (Addendum)
[address]       [place] [address]             [phone]      4 Days Post-Op Procedure(s) (LRB): CORONARY ARTERY BYPASS GRAFTING (CABG) x four , using left internal mammary artery and right leg greater saphenous vein harvested endoscopically, and Cryo saphenous vein (N/A) TRANSESOPHAGEAL ECHOCARDIOGRAM (TEE) (N/A) Subjective: Feels pretty good, ambulation is improving  Objective: Vital signs in last 24 hours: Temp:  [97.9 F (36.6 C)-99 F (37.2 C)] 97.9 F (36.6 C) (03/03 1057) Pulse Rate:  [79-88] 79 (03/03 0513) Cardiac Rhythm: Normal sinus rhythm (03/03 0740) Resp:  [18-20] 18 (03/03 1057) BP: (119-125)/(70-76) 119/76 (03/03 1057) SpO2:  [95 %-98 %] 98 % (03/03 1057) Weight:  [259 lb (117.5 kg)] 259 lb (117.5 kg) (03/03 0513)  Hemodynamic parameters for last 24 hours:    Intake/Output from previous day: 03/02 0701 - 03/03 0700 In: 600 [P.O.:600] Out: 2130 [Urine:2130] Intake/Output this shift: Total I/O In: 240 [P.O.:240] Out: -   General appearance: alert, cooperative and no distress Heart: regular rate and rhythm Lungs: mildly dim in bases Abdomen: benign Extremities: + LE edema Wound: incis healing well  Lab Results:  Recent Labs  05/13/16 0321 05/14/16 0344  WBC 13.8* 12.5*  HGB 12.3* 12.6*  HCT 37.3* 37.9*  PLT 147* 198   BMET:  Recent Labs  05/13/16 0321 05/14/16 0344  NA 132* 134*  K 5.1 3.9  CL 95* 97*  CO2 29 27  GLUCOSE 108* 138*  BUN 11 13  CREATININE 0.85 0.79  CALCIUM 8.8* 8.9    PT/INR: No results for input(s): LABPROT, INR in the last 72 hours. ABG    Component Value Date/Time   PHART 7.319 (L) 05/10/2016 2005   HCO3 22.0 05/10/2016 2005   TCO2 23 05/11/2016 1703   ACIDBASEDEF 4.0 (H) 05/10/2016 2005   O2SAT 59.6 05/12/2016 0405   CBG (last 3)   Recent Labs  05/13/16 0618 05/13/16 1119 05/13/16 2127  GLUCAP 80 103* 128*    Meds Scheduled Meds: . acetaminophen  1,000 mg Oral Q6H     Or  . acetaminophen (TYLENOL) oral liquid 160 mg/5 mL  1,000 mg Per Tube Q6H  . aspirin EC  81 mg Oral Daily  . atorvastatin  80 mg Oral q1800  . bisacodyl  10 mg Oral Daily   Or  . bisacodyl  10 mg Rectal Daily  . clopidogrel  75 mg Oral Daily  . docusate sodium  200 mg Oral Daily  . enoxaparin (LOVENOX) injection  40 mg Subcutaneous QHS  . furosemide  40 mg Oral Daily  . lisinopril  10 mg Oral Daily  . mouth rinse  15 mL Mouth Rinse BID  . metoCLOPramide (REGLAN) injection  10 mg Intravenous Q6H  . metoprolol tartrate  12.5 mg Oral BID   Or  . metoprolol tartrate  12.5 mg Per Tube BID  . pantoprazole  40 mg Oral Daily  . sodium chloride flush  3 mL Intravenous Q12H  . sodium chloride flush  3 mL Intravenous Q12H   Continuous Infusions: . sodium chloride 10 mL/hr at 05/12/16 0800  . sodium chloride    . sodium chloride Stopped (05/10/16 1900)  . lactated ringers 10 mL/hr at 05/12/16 0800   PRN Meds:.sodium chloride, sodium chloride, magnesium hydroxide, metoprolol, ondansetron (ZOFRAN) IV, sodium chloride flush, sodium chloride flush, traMADol  Xrays Dg Chest 2 View  Result Date:  05/13/2016 CLINICAL DATA:  Status post CABG. EXAM: CHEST  2 VIEW COMPARISON:  05/12/2016 FINDINGS: Sequelae of recent CABG are again identified. Right jugular sheath and left chest tube have been removed. Cardiac silhouette remains enlarged. Mild pulmonary vascular congestion on the prior study has decreased. Mild bibasilar opacities have also mildly improved. No pneumothorax is identified. There are small bilateral pleural effusions. IMPRESSION: 1. Decreased pulmonary vascular congestion and bibasilar atelectasis. 2. Small pleural effusions. 3. No pneumothorax following left chest tube removal. Electronically Signed   By: Logan Bores M.D.   On: 05/13/2016 08:53    Assessment/Plan: S/P Procedure(s) (LRB): CORONARY ARTERY BYPASS GRAFTING (CABG) x four , using left internal mammary artery and right leg  greater saphenous vein harvested endoscopically, and Cryo saphenous vein (N/A) TRANSESOPHAGEAL ECHOCARDIOGRAM (TEE) (N/A)  1 steady progress 2 hemodyn stable, sinus with PAC's, replace K+ 3 labs stable. Improved platelets and WBC 4 cont diuretic for volume overload 5 d/c epw's 6 poss home1-2 days  LOS: 4 days    GOLD,WAYNE E 05/14/2016 Patient examined, agree with above assessment and plan Maintaining sinus rhythm Weight continues to decrease nicely Discharge home Monday

## 2016-05-15 MED ORDER — CEFTAZIDIME 1 G IJ SOLR
1.0000 g | Freq: Three times a day (TID) | INTRAMUSCULAR | Status: DC
  Administered 2016-05-15 – 2016-05-17 (×6): 1 g via INTRAVENOUS
  Filled 2016-05-15 (×7): qty 1

## 2016-05-15 NOTE — Progress Notes (Signed)
No new drainage on dressing. Will continue to monitor. Call light within reach

## 2016-05-15 NOTE — Progress Notes (Addendum)
[address]       [place] [address]             [phone]      5 Days Post-Op Procedure(s) (LRB): CORONARY ARTERY BYPASS GRAFTING (CABG) x four , using left internal mammary artery and right leg greater saphenous vein harvested endoscopically, and Cryo saphenous vein (N/A) TRANSESOPHAGEAL ECHOCARDIOGRAM (TEE) (N/A) Subjective: Having some sternal drainage from lower portion of incis. Feeling well overall  Objective: Vital signs in last 24 hours: Temp:  [97.8 F (36.6 C)-98 F (36.7 C)] 97.9 F (36.6 C) (03/04 0518) Pulse Rate:  [78-99] 96 (03/04 0949) Cardiac Rhythm: Normal sinus rhythm;Bundle branch block (03/04 0730) Resp:  [18-19] 18 (03/04 0518) BP: (118-147)/(64-98) 120/79 (03/04 0949) SpO2:  [98 %-100 %] 98 % (03/04 0518) Weight:  [256 lb 9.6 oz (116.4 kg)] 256 lb 9.6 oz (116.4 kg) (03/04 0518)  Hemodynamic parameters for last 24 hours:    Intake/Output from previous day: 03/03 0701 - 03/04 0700 In: 960 [P.O.:960] Out: 201 [Urine:200; Stool:1] Intake/Output this shift: Total I/O In: 3 [I.V.:3] Out: -   General appearance: alert, cooperative and no distress Heart: regular rate and rhythm Lungs: mildly dim in the bases Abdomen: benign Extremities: + LE edema Wound: incis healing well, serosang drainage from lower portion of sternal incis, no erethema   Lab Results:  Recent Labs  05/13/16 0321 05/14/16 0344  WBC 13.8* 12.5*  HGB 12.3* 12.6*  HCT 37.3* 37.9*  PLT 147* 198   BMET:  Recent Labs  05/13/16 0321 05/14/16 0344  NA 132* 134*  K 5.1 3.9  CL 95* 97*  CO2 29 27  GLUCOSE 108* 138*  BUN 11 13  CREATININE 0.85 0.79  CALCIUM 8.8* 8.9    PT/INR: No results for input(s): LABPROT, INR in the last 72 hours. ABG    Component Value Date/Time   PHART 7.319 (L) 05/10/2016 2005   HCO3 22.0 05/10/2016 2005   TCO2 23 05/11/2016 1703   ACIDBASEDEF 4.0 (H) 05/10/2016 2005   O2SAT 59.6 05/12/2016 0405   CBG (last 3)    Recent Labs  05/13/16 0618 05/13/16 1119 05/13/16 2127  GLUCAP 80 103* 128*    Meds Scheduled Meds: . acetaminophen  1,000 mg Oral Q6H   Or  . acetaminophen (TYLENOL) oral liquid 160 mg/5 mL  1,000 mg Per Tube Q6H  . aspirin EC  81 mg Oral Daily  . atorvastatin  80 mg Oral q1800  . bisacodyl  10 mg Oral Daily   Or  . bisacodyl  10 mg Rectal Daily  . clopidogrel  75 mg Oral Daily  . docusate sodium  200 mg Oral Daily  . enoxaparin (LOVENOX) injection  40 mg Subcutaneous QHS  . furosemide  40 mg Oral Daily  . lisinopril  10 mg Oral Daily  . mouth rinse  15 mL Mouth Rinse BID  . metoCLOPramide (REGLAN) injection  10 mg Intravenous Q6H  . metoprolol tartrate  12.5 mg Oral BID   Or  . metoprolol tartrate  12.5 mg Per Tube BID  . pantoprazole  40 mg Oral Daily  . potassium chloride  20 mEq Oral Daily  . sodium chloride flush  3 mL Intravenous Q12H  . sodium chloride flush  3 mL Intravenous Q12H   Continuous Infusions: . sodium chloride 10 mL/hr at 05/12/16 0800  . sodium chloride    . sodium chloride Stopped (05/10/16 1900)  . lactated ringers 10  mL/hr at 05/12/16 0800   PRN Meds:.sodium chloride, sodium chloride, magnesium hydroxide, metoprolol, ondansetron (ZOFRAN) IV, sodium chloride flush, sodium chloride flush, traMADol  Xrays No results found.  Assessment/Plan: S/P Procedure(s) (LRB): CORONARY ARTERY BYPASS GRAFTING (CABG) x four , using left internal mammary artery and right leg greater saphenous vein harvested endoscopically, and Cryo saphenous vein (N/A) TRANSESOPHAGEAL ECHOCARDIOGRAM (TEE) (N/A)  1 steady progress 2 sternal drainage c/w fatty necrosis- monitor closely 3 sinus with pac's pvc's - hopefully will not go into afib 4 cont diuretic for volume overload 5 push rehab as able/cont pulm toilet  LOS: 5 days    GOLD,WAYNE E 05/15/2016  fat necrosis drainage lower sternum Check culture and cover with iv Tressie Ellis Cont lasix patient examined and  medical record reviewed,agree with above note. Tharon Aquas Trigt III 05/15/2016

## 2016-05-16 NOTE — Progress Notes (Signed)
CARDIAC REHAB PHASE I   PRE:  Rate/Rhythm: 92 SR  BP:  Supine:   Sitting: 122/93  Standing:    SaO2: 97%RA  MODE:  Ambulation: 890 ft   POST:  Rate/Rhythm: 116 ST  BP:  Supine:   Sitting: 111/84  Standing:    SaO2: 98%RA [id number] Pt very motivated to walk. He walked 890 ft on RA with his own rolling walker with steady gait. Did not need to rest. To recliner with call bell. Feet up. Pt knows that CRP 2 [place] will contact him after discharge re program. Re enforced sternal precautions.   Graylon Good, RN BSN  05/16/2016 8:40 AM

## 2016-05-16 NOTE — Progress Notes (Addendum)
      [address]       ,[place] [address]             [phone]      6 Days Post-Op Procedure(s) (LRB): CORONARY ARTERY BYPASS GRAFTING (CABG) x four , using left internal mammary artery and right leg greater saphenous vein harvested endoscopically, and Cryo saphenous vein (N/A) TRANSESOPHAGEAL ECHOCARDIOGRAM (TEE) (N/A)   Subjective:  No new complaints.  Feeling pretty good.  Has ambulated around the hallways without difficulty.  + BM  Objective: Vital signs in last 24 hours: Temp:  [97.8 F (36.6 C)-98.2 F (36.8 C)] 97.8 F (36.6 C) (03/05 0433) Pulse Rate:  [84-96] 84 (03/05 0433) Cardiac Rhythm: Normal sinus rhythm (03/04 1900) Resp:  [18] 18 (03/05 0433) BP: (110-126)/(63-79) 115/68 (03/05 0433) SpO2:  [97 %-99 %] 97 % (03/05 0433) Weight:  [254 lb 14.4 oz (115.6 kg)] 254 lb 14.4 oz (115.6 kg) (03/05 0433)  Intake/Output from previous day: 03/04 0701 - 03/05 0700 In: 483 [P.O.:480; I.V.:3] Out: 2 [Urine:2]  General appearance: alert, cooperative and no distress Heart: regular rate and rhythm Lungs: clear to auscultation bilaterally Abdomen: soft, non-tender; bowel sounds normal; no masses,  no organomegaly Extremities: edema 1-2+ Wound: clean and dry, sternotomy with some minor fat necrosis along inferior portion no erythema present  Lab Results:  Recent Labs  05/14/16 0344  WBC 12.5*  HGB 12.6*  HCT 37.9*  PLT 198   BMET:  Recent Labs  05/14/16 0344  NA 134*  K 3.9  CL 97*  CO2 27  GLUCOSE 138*  BUN 13  CREATININE 0.79  CALCIUM 8.9    PT/INR: No results for input(s): LABPROT, INR in the last 72 hours. ABG    Component Value Date/Time   PHART 7.319 (L) 05/10/2016 2005   HCO3 22.0 05/10/2016 2005   TCO2 23 05/11/2016 1703   ACIDBASEDEF 4.0 (H) 05/10/2016 2005   O2SAT 59.6 05/12/2016 0405   CBG (last 3)   Recent Labs  05/13/16 1119 05/13/16 2127  GLUCAP 103* 128*    Assessment/Plan: S/P Procedure(s)  (LRB): CORONARY ARTERY BYPASS GRAFTING (CABG) x four , using left internal mammary artery and right leg greater saphenous vein harvested endoscopically, and Cryo saphenous vein (N/A) TRANSESOPHAGEAL ECHOCARDIOGRAM (TEE) (N/A)  1. CV- NSR, occasional PVCs- continue Lopressor, Lisinopril 2. Pulm- no acute issues, good use of IS 3. Renal- creatinine has been WNL, weight is trending down, + LE edema continue Lasix 4. ID- fat necrosis of inferior portion of sternotomy- remains afebrile, no leukocytosis, GS from wound culture does not show organisms, continue empiric Fortaz for now 5. Dispo- patient stable, making good progress, maintaining NSR, mild fat necrosis on empiric Fortaz, culture pending... However GS shows no organisms..... Will be ready for discharge once drainage decreases.   LOS: 6 days    Ellwood Handler 05/16/2016 patient examined and medical record reviewed,agree with above note. Tharon Aquas Trigt III 05/17/2016

## 2016-05-17 LAB — AEROBIC CULTURE W GRAM STAIN (SUPERFICIAL SPECIMEN)
Culture: NO GROWTH
Special Requests: NORMAL

## 2016-05-17 MED ORDER — TRAMADOL HCL 50 MG PO TABS: 50.0000 mg | ORAL_TABLET | ORAL | 0 refills | Status: DC | PRN

## 2016-05-17 MED ORDER — POTASSIUM CHLORIDE CRYS ER 20 MEQ PO TBCR: 20.0000 meq | EXTENDED_RELEASE_TABLET | Freq: Every day | ORAL | 0 refills | Status: DC

## 2016-05-17 MED ORDER — FUROSEMIDE 40 MG PO TABS: 40.0000 mg | ORAL_TABLET | Freq: Every day | ORAL | 0 refills | Status: DC

## 2016-05-17 MED ORDER — LISINOPRIL 10 MG PO TABS: 10.0000 mg | ORAL_TABLET | Freq: Every day | ORAL | 3 refills | Status: DC

## 2016-05-17 MED ORDER — CEPHALEXIN 500 MG PO CAPS: 500.0000 mg | ORAL_CAPSULE | Freq: Three times a day (TID) | ORAL | 0 refills | Status: DC

## 2016-05-17 MED ORDER — CLOPIDOGREL BISULFATE 75 MG PO TABS: 75.0000 mg | ORAL_TABLET | Freq: Every day | ORAL | 0 refills | Status: DC

## 2016-05-17 NOTE — Care Management Important Message (Signed)
Important Message  Patient Details  Name: SAI LICURSI MRN: [id number] Date of Birth: 22-Jan-1946   Medicare Important Message Given:  Yes    Victoria Euceda Abena 05/17/2016, 11:12 AM

## 2016-05-17 NOTE — Progress Notes (Addendum)
10:00 Notified by Santiago Glad [facility] clinical liaison that patient made [place] this AM. CM requested [facility] RN order from Barrett PA.  11:15 Spoke with patient, he states he will have multiple family members at his side after DC, he was on phone setting up Cardiac Rehab at [facility] upon assessment, states he "feels ready to dance" he nor family could identify need for [facility] RN. HH declined.

## 2016-05-17 NOTE — Progress Notes (Signed)
Removed IV. Reviewed d/c instructions and prescriptions given.  Awaiting wheelchair for discharge.  Glyn Zendejas, Mervin Kung RN

## 2016-05-17 NOTE — Progress Notes (Addendum)
      [address]       [place] [address]             [phone]      7 Days Post-Op Procedure(s) (LRB): CORONARY ARTERY BYPASS GRAFTING (CABG) x four , using left internal mammary artery and right leg greater saphenous vein harvested endoscopically, and Cryo saphenous vein (N/A) TRANSESOPHAGEAL ECHOCARDIOGRAM (TEE) (N/A)   Subjective:  No new complaints.  Feels great, ready to go home.  + ambulation  + BM  Objective: Vital signs in last 24 hours: Temp:  [97.6 F (36.4 C)-98.9 F (37.2 C)] 97.6 F (36.4 C) (03/06 0542) Pulse Rate:  [82-91] 82 (03/06 0542) Cardiac Rhythm: Normal sinus rhythm (03/06 0700) Resp:  [18-19] 19 (03/06 0542) BP: (105-122)/(63-72) 106/65 (03/06 0542) SpO2:  [98 %] 98 % (03/06 0542) Weight:  [253 lb 12.8 oz (115.1 kg)] 253 lb 12.8 oz (115.1 kg) (03/06 0500)  Intake/Output from previous day: 03/05 0701 - 03/06 0700 In: 103 [I.V.:3; IV Piggyback:100] Out: -   General appearance: alert, cooperative and no distress Heart: regular rate and rhythm Lungs: clear to auscultation bilaterally Abdomen: soft, non-tender; bowel sounds normal; no masses,  no organomegaly Extremities: edema trace Wound: clean, some minor drainage from inferior portion of sternotomy, no signs of infection  Lab Results: No results for input(s): WBC, HGB, HCT, PLT in the last 72 hours. BMET: No results for input(s): NA, K, CL, CO2, GLUCOSE, BUN, CREATININE, CALCIUM in the last 72 hours.  PT/INR: No results for input(s): LABPROT, INR in the last 72 hours. ABG    Component Value Date/Time   PHART 7.319 (L) 05/10/2016 2005   HCO3 22.0 05/10/2016 2005   TCO2 23 05/11/2016 1703   ACIDBASEDEF 4.0 (H) 05/10/2016 2005   O2SAT 59.6 05/12/2016 0405   CBG (last 3)  No results for input(s): GLUCAP in the last 72 hours.  Assessment/Plan: S/P Procedure(s) (LRB): CORONARY ARTERY BYPASS GRAFTING (CABG) x four , using left internal mammary artery and right leg  greater saphenous vein harvested endoscopically, and Cryo saphenous vein (N/A) TRANSESOPHAGEAL ECHOCARDIOGRAM (TEE) (N/A)  1. CV- NSR- continue Lopressor, lisionpril 2. Pulm- no acute issues, continue IS 3. Renal- creatinine WNL, weight is improving, continue Lasix 4. ID- superficial fat necrosis, inferior portion of sternotomy... Continue wound care, culture negative... Empiric Keflex for 5 days 5. Dispo- patient stable, will d/c home today   LOS: 7 days    BARRETT, ERIN 05/17/2016  discharge instructions d/w patient and family patient examined and medical record reviewed,agree with above note. Tharon Aquas Trigt III 05/17/2016

## 2016-05-24 ENCOUNTER — Encounter (INDEPENDENT_AMBULATORY_CARE_PROVIDER_SITE_OTHER): Payer: Self-pay

## 2016-05-24 DIAGNOSIS — Z4889 Encounter for other specified surgical aftercare: Secondary | ICD-10-CM

## 2016-05-24 DIAGNOSIS — Z951 Presence of aortocoronary bypass graft: Secondary | ICD-10-CM

## 2016-05-28 ENCOUNTER — Encounter (HOSPITAL_COMMUNITY): Payer: Self-pay | Admitting: Emergency Medicine

## 2016-05-28 ENCOUNTER — Emergency Department (HOSPITAL_COMMUNITY): Payer: Medicare Other

## 2016-05-28 ENCOUNTER — Inpatient Hospital Stay (HOSPITAL_COMMUNITY)
Admission: EM | Admit: 2016-05-28 | Discharge: 2016-05-30 | DRG: 303 | Discharge status: Against Medical Advice | Payer: Medicare Other | Attending: Cardiovascular Disease | Admitting: Cardiovascular Disease

## 2016-05-28 DIAGNOSIS — Z7902 Long term (current) use of antithrombotics/antiplatelets: Secondary | ICD-10-CM

## 2016-05-28 DIAGNOSIS — M1990 Unspecified osteoarthritis, unspecified site: Secondary | ICD-10-CM | POA: Diagnosis present

## 2016-05-28 DIAGNOSIS — R079 Chest pain, unspecified: Secondary | ICD-10-CM | POA: Diagnosis present

## 2016-05-28 DIAGNOSIS — I25119 Atherosclerotic heart disease of native coronary artery with unspecified angina pectoris: Secondary | ICD-10-CM | POA: Diagnosis not present

## 2016-05-28 DIAGNOSIS — Z8249 Family history of ischemic heart disease and other diseases of the circulatory system: Secondary | ICD-10-CM

## 2016-05-28 DIAGNOSIS — J45909 Unspecified asthma, uncomplicated: Secondary | ICD-10-CM | POA: Diagnosis present

## 2016-05-28 DIAGNOSIS — I208 Other forms of angina pectoris: Secondary | ICD-10-CM | POA: Diagnosis not present

## 2016-05-28 DIAGNOSIS — Z951 Presence of aortocoronary bypass graft: Secondary | ICD-10-CM

## 2016-05-28 DIAGNOSIS — Z7982 Long term (current) use of aspirin: Secondary | ICD-10-CM

## 2016-05-28 DIAGNOSIS — I252 Old myocardial infarction: Secondary | ICD-10-CM

## 2016-05-28 DIAGNOSIS — E785 Hyperlipidemia, unspecified: Secondary | ICD-10-CM | POA: Diagnosis present

## 2016-05-28 DIAGNOSIS — I10 Essential (primary) hypertension: Secondary | ICD-10-CM | POA: Diagnosis present

## 2016-05-28 DIAGNOSIS — I25118 Atherosclerotic heart disease of native coronary artery with other forms of angina pectoris: Secondary | ICD-10-CM

## 2016-05-28 DIAGNOSIS — I200 Unstable angina: Secondary | ICD-10-CM | POA: Diagnosis not present

## 2016-05-28 DIAGNOSIS — Z885 Allergy status to narcotic agent status: Secondary | ICD-10-CM

## 2016-05-28 DIAGNOSIS — Z79899 Other long term (current) drug therapy: Secondary | ICD-10-CM

## 2016-05-28 DIAGNOSIS — E782 Mixed hyperlipidemia: Secondary | ICD-10-CM

## 2016-05-28 DIAGNOSIS — I214 Non-ST elevation (NSTEMI) myocardial infarction: Secondary | ICD-10-CM | POA: Diagnosis not present

## 2016-05-28 LAB — COMPREHENSIVE METABOLIC PANEL
ALT: 22 U/L (ref 17–63)
AST: 27 U/L (ref 15–41)
Albumin: 3.2 g/dL — ABNORMAL LOW (ref 3.5–5.0)
Alkaline Phosphatase: 76 U/L (ref 38–126)
Anion gap: 9 (ref 5–15)
BUN: 12 mg/dL (ref 6–20)
CHLORIDE: 102 mmol/L (ref 101–111)
CO2: 24 mmol/L (ref 22–32)
Calcium: 9.3 mg/dL (ref 8.9–10.3)
Creatinine, Ser: 0.88 mg/dL (ref 0.61–1.24)
Glucose, Bld: 173 mg/dL — ABNORMAL HIGH (ref 65–99)
POTASSIUM: 4.6 mmol/L (ref 3.5–5.1)
SODIUM: 135 mmol/L (ref 135–145)
Total Bilirubin: 1.1 mg/dL (ref 0.3–1.2)
Total Protein: 6.9 g/dL (ref 6.5–8.1)

## 2016-05-28 LAB — CBC
HEMATOCRIT: 37.8 % — AB (ref 39.0–52.0)
HEMOGLOBIN: 12.2 g/dL — AB (ref 13.0–17.0)
MCH: 30.7 pg (ref 26.0–34.0)
MCHC: 32.3 g/dL (ref 30.0–36.0)
MCV: 95 fL (ref 78.0–100.0)
Platelets: 351 10*3/uL (ref 150–400)
RBC: 3.98 MIL/uL — ABNORMAL LOW (ref 4.22–5.81)
RDW: 14.4 % (ref 11.5–15.5)
WBC: 6.7 10*3/uL (ref 4.0–10.5)

## 2016-05-28 LAB — I-STAT TROPONIN, ED: Troponin i, poc: 0.06 ng/mL (ref 0.00–0.08)

## 2016-05-28 LAB — TROPONIN I: Troponin I: 3.37 ng/mL (ref <0.03)

## 2016-05-28 LAB — SEDIMENTATION RATE: SED RATE: 80 mm/h — AB (ref 0–16)

## 2016-05-28 LAB — APTT: APTT: 35 s (ref 24–36)

## 2016-05-28 LAB — PROTIME-INR
INR: 1.22
PROTHROMBIN TIME: 15.5 s — AB (ref 11.4–15.2)

## 2016-05-28 MED ORDER — ONDANSETRON HCL 4 MG/2ML IJ SOLN: 4.0000 mg | Freq: Four times a day (QID) | INTRAMUSCULAR | Status: DC | PRN

## 2016-05-28 MED ORDER — ENOXAPARIN SODIUM 120 MG/0.8ML ~~LOC~~ SOLN
110.0000 mg | Freq: Two times a day (BID) | SUBCUTANEOUS | Status: DC
  Administered 2016-05-29 (×2): 110 mg via SUBCUTANEOUS
  Filled 2016-05-28 (×3): qty 0.73

## 2016-05-28 MED ORDER — ISOSORBIDE MONONITRATE ER 60 MG PO TB24
60.0000 mg | ORAL_TABLET | Freq: Every day | ORAL | Status: DC
  Administered 2016-05-28 – 2016-05-29 (×2): 60 mg via ORAL
  Filled 2016-05-28 (×2): qty 1

## 2016-05-28 MED ORDER — CLOPIDOGREL BISULFATE 75 MG PO TABS
75.0000 mg | ORAL_TABLET | Freq: Every day | ORAL | Status: DC
  Administered 2016-05-28 – 2016-05-30 (×3): 75 mg via ORAL
  Filled 2016-05-28 (×3): qty 1

## 2016-05-28 MED ORDER — ENOXAPARIN SODIUM 80 MG/0.8ML ~~LOC~~ SOLN
70.0000 mg | SUBCUTANEOUS | Status: AC
  Administered 2016-05-28: 21:00:00 70 mg via SUBCUTANEOUS
  Filled 2016-05-28: qty 0.8

## 2016-05-28 MED ORDER — METOPROLOL TARTRATE 12.5 MG HALF TABLET
12.5000 mg | ORAL_TABLET | Freq: Two times a day (BID) | ORAL | Status: DC
Start: 2016-05-28 — End: 2016-05-30
  Administered 2016-05-28 – 2016-05-29 (×3): 12.5 mg via ORAL
  Filled 2016-05-28 (×3): qty 1

## 2016-05-28 MED ORDER — ZOLPIDEM TARTRATE 5 MG PO TABS: 5.0000 mg | ORAL_TABLET | Freq: Every evening | ORAL | Status: DC | PRN

## 2016-05-28 MED ORDER — ENOXAPARIN SODIUM 40 MG/0.4ML ~~LOC~~ SOLN
40.0000 mg | SUBCUTANEOUS | Status: DC
  Administered 2016-05-28: 40 mg via SUBCUTANEOUS
  Filled 2016-05-28: qty 0.4

## 2016-05-28 MED ORDER — ASPIRIN 81 MG PO CHEW
81.0000 mg | CHEWABLE_TABLET | Freq: Every day | ORAL | Status: DC
  Administered 2016-05-29: 81 mg via ORAL
  Filled 2016-05-28 (×2): qty 1

## 2016-05-28 MED ORDER — TRAMADOL HCL 50 MG PO TABS: 50.0000 mg | ORAL_TABLET | ORAL | Status: DC | PRN

## 2016-05-28 MED ORDER — ASPIRIN 81 MG PO CHEW
324.0000 mg | CHEWABLE_TABLET | Freq: Once | ORAL | Status: AC
  Administered 2016-05-28: 324 mg via ORAL
  Filled 2016-05-28: qty 4

## 2016-05-28 MED ORDER — ALPRAZOLAM 0.25 MG PO TABS: 0.2500 mg | ORAL_TABLET | Freq: Two times a day (BID) | ORAL | Status: DC | PRN

## 2016-05-28 MED ORDER — ACETAMINOPHEN 325 MG PO TABS: 650.0000 mg | ORAL_TABLET | ORAL | Status: DC | PRN

## 2016-05-28 MED ORDER — NITROGLYCERIN 0.4 MG SL SUBL: 0.4000 mg | SUBLINGUAL_TABLET | SUBLINGUAL | Status: DC | PRN

## 2016-05-28 MED ORDER — LISINOPRIL 10 MG PO TABS
10.0000 mg | ORAL_TABLET | Freq: Every day | ORAL | Status: DC
  Administered 2016-05-28 – 2016-05-29 (×2): 10 mg via ORAL
  Filled 2016-05-28 (×2): qty 1

## 2016-05-28 MED ORDER — ATORVASTATIN CALCIUM 80 MG PO TABS
80.0000 mg | ORAL_TABLET | Freq: Every day | ORAL | Status: DC
  Administered 2016-05-28 – 2016-05-29 (×2): 80 mg via ORAL
  Filled 2016-05-28 (×2): qty 1

## 2016-05-28 NOTE — ED Provider Notes (Signed)
[facility] DEPT Provider Note   CSN: [id number] Arrival date & time: 05/28/16  4709     History   Chief Complaint Chief Complaint  Patient presents with  . Chest Injury  . Shortness of Breath    HPI Albert Carter is a 71 y.o. male.  HPI  Is a 71 year old man with a history of coronary artery disease status post CABG 2 and half weeks ago who presents today with chest pain that began this a.m. He has been doing well. He woke up this morning around 6 AM and began having some pain in his anterior chest that radiated to both of his shoulders. It is similar to previous cardiac pain but not as severe. He denies any dyspnea, diaphoresis, nausea, or vomiting. He is unclear which medications she took. He is currently pain-free.  Past Medical History:  Diagnosis Date  . Arthritis    " all through my body"  . Asthma   . Bronchitis   . CAD (coronary artery disease), severe with CABG recommended.  04/25/2016  . Hyperlipemia 04/25/2016  . Hypertension   . NSTEMI (non-ST elevated myocardial infarction) ([place]) 04/23/2016  . Ventricular tachycardia, nonsustained ([place]) 04/23/2016    Patient Active Problem List   Diagnosis Date Noted  . 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 Hypertension 05/03/2016  . CAD (coronary artery disease), severe with CABG recommended.  04/25/2016  . Hyperlipemia 04/25/2016  . NSTEMI (non-ST elevated myocardial infarction) ([place]) 04/23/2016  . Upper back pain 04/23/2016  . Hypertensive urgency 04/23/2016  . Ventricular tachycardia, nonsustained ([place]) 04/23/2016    Past Surgical History:  Procedure Laterality Date  . COLONOSCOPY  03/24/2011   Procedure: COLONOSCOPY;  Surgeon: Daneil Dolin, MD;  [facility];  Service: Endoscopy;  Laterality: N/A;  11:30 AM  . CORONARY ARTERY BYPASS GRAFT N/A 05/10/2016   Procedure: CORONARY ARTERY BYPASS GRAFTING (CABG) x four , using left internal mammary artery and right leg greater saphenous vein harvested endoscopically,  and Cryo saphenous vein;  Surgeon: Ivin Poot, MD;  Location: [facility];  Service: Open Heart Surgery;  Laterality: N/A;  . FACIAL FRACTURE SURGERY  1973   [facility]  . LEFT HEART CATH AND CORONARY ANGIOGRAPHY N/A 04/25/2016   Procedure: Left Heart Cath and Coronary Angiography;  Surgeon: Troy Sine, MD;  Location: [facility];  Service: Cardiovascular;  Laterality: N/A;  . [place]   broke jaw and reset  . TEE WITHOUT CARDIOVERSION N/A 05/10/2016   Procedure: TRANSESOPHAGEAL ECHOCARDIOGRAM (TEE);  Surgeon: Ivin Poot, MD;  Location: [facility];  Service: Open Heart Surgery;  Laterality: N/A;       Home Medications    Prior to Admission medications   Medication Sig Start Date End Date Taking? Authorizing Provider  aspirin 81 MG chewable tablet Chew 1 tablet (81 mg total) by mouth daily. 04/26/16  Yes [name], NP  atorvastatin (LIPITOR) 80 MG tablet Take 1 tablet (80 mg total) by mouth daily at 6 PM. 04/25/16  Yes [name], NP  clopidogrel (PLAVIX) 75 MG tablet Take 1 tablet (75 mg total) by mouth daily. 05/17/16  Yes Erin R Barrett, PA-C  furosemide (LASIX) 40 MG tablet Take 1 tablet (40 mg total) by mouth daily. FOR 7 DAYS Patient taking differently: Take 40 mg by mouth daily.  05/17/16  Yes Erin R Barrett, PA-C  isosorbide mononitrate (IMDUR) 60 MG 24 hr tablet Take 60 mg by mouth daily. 04/26/16  Yes Historical Provider, MD  lisinopril (PRINIVIL,ZESTRIL) 10 MG tablet Take 1 tablet (10 mg total) by mouth daily. 05/17/16  Yes Erin R Barrett, PA-C  metoprolol tartrate (LOPRESSOR) 25 MG tablet Take 0.5 tablets (12.5 mg total) by mouth 2 (two) times daily. 04/25/16  Yes [name], NP  nitroGLYCERIN (NITROSTAT) 0.4 MG SL tablet Place 0.4 mg under the tongue every 5 (five) minutes as needed for chest pain. 04/26/16  Yes Historical Provider, MD  potassium chloride SA (K-DUR,KLOR-CON) 20 MEQ tablet Take 1 tablet (20 mEq total) by mouth daily. For 7 Days Patient taking  differently: Take 20 mEq by mouth daily.  05/17/16  Yes Erin R Barrett, PA-C  traMADol (ULTRAM) 50 MG tablet Take 1-2 tablets (50-100 mg total) by mouth every 4 (four) hours as needed for moderate pain. 05/17/16  Yes Erin R Barrett, PA-C  acetaminophen (TYLENOL) 325 MG tablet Take 2 tablets (650 mg total) by mouth every 4 (four) hours as needed for headache or mild pain. Patient not taking: Reported on 05/04/2016 04/25/16   [name], NP  cephALEXin (KEFLEX) 500 MG capsule Take 1 capsule (500 mg total) by mouth 3 (three) times daily. Patient not taking: Reported on 05/28/2016 05/17/16   Lodema Hong Barrett, PA-C    Family History Family History  Problem Relation Age of Onset  . Heart disease Mother     Social History Social History  Substance Use Topics  . Smoking status: Never Smoker  . Smokeless tobacco: Never Used  . Alcohol use No     Comment: last time- 2014     Allergies   Codeine and Morphine and related   Review of Systems Review of Systems  All other systems reviewed and are negative.    Physical Exam Updated Vital Signs BP 132/82   Pulse 72   Temp 97.8 F (36.6 C) (Oral)   Resp (!) 39   SpO2 98%   Physical Exam  Constitutional: He appears well-developed and well-nourished. No distress.  HENT:  Head: Normocephalic and atraumatic.  Right Ear: External ear normal.  Left Ear: External ear normal.  Eyes: EOM are normal. Pupils are equal, round, and reactive to light.  Neck: Normal range of motion. Neck supple.  Cardiovascular: Normal rate, regular rhythm, normal heart sounds and intact distal pulses.   Pulmonary/Chest: Effort normal and breath sounds normal.  Well healing midline anterior chest scar without evidence of erythema or crepitus  Abdominal: Soft. Bowel sounds are normal.  Musculoskeletal: Normal range of motion. He exhibits no edema.  Skin: Skin is warm and dry. Capillary refill takes less than 2 seconds.  Psychiatric: He has a normal mood and affect.    Nursing note and vitals reviewed.    ED Treatments / Results  Labs (all labs ordered are listed, but only abnormal results are displayed) Labs Reviewed  CBC - Abnormal; Notable for the following:       Result Value   RBC 3.98 (*)    Hemoglobin 12.2 (*)    HCT 37.8 (*)    All other components within normal limits  COMPREHENSIVE METABOLIC PANEL - Abnormal; Notable for the following:    Glucose, Bld 173 (*)    Albumin 3.2 (*)    All other components within normal limits  PROTIME-INR - Abnormal; Notable for the following:    Prothrombin Time 15.5 (*)    All other components within normal limits  APTT  I-STAT TROPOININ, ED  Randolm Idol, ED  EKG  EKG Interpretation  Date/Time:  Saturday May 28 2016 10:02:34 EDT Ventricular Rate:  89 PR Interval:    QRS Duration: 103 QT Interval:  381 QTC Calculation: 464 R Axis:   -27 Text Interpretation:  Sinus rhythm Atrial premature complexes Borderline left axis deviation Anterior infarct, old Nonspecific T abnormalities, inferior leads Confirmed by Raymon Schlarb MD, Andee Poles [phone]) on 05/28/2016 1:28:50 PM       Radiology Dg Chest 2 View  Result Date: 05/28/2016 CLINICAL DATA:  Left chest and back pain and right arm and shoulder pain today. EXAM: CHEST  2 VIEW COMPARISON:  05/13/2016. FINDINGS: Enlarged cardiac silhouette with improvement. Stable post CABG changes. Decreased linear density at the lung bases. One of the pleural effusions has resolved with a small effusion remaining on one of the sides on the lateral view. Thoracic spine degenerative changes. IMPRESSION: 1. Improving bibasilar atelectasis. 2. Improving cardiomegaly and pleural fluid. Electronically Signed   By: Claudie Revering M.D.   On: 05/28/2016 10:39    Procedures Procedures (including critical care time)  Medications Ordered in ED Medications  aspirin chewable tablet 324 mg (324 mg Oral Given 05/28/16 1032)     Initial Impression / Assessment and Plan / ED  Course  I have reviewed the triage vital signs and the nursing notes.  Pertinent labs & imaging results that were available during my care of the patient were reviewed by me and considered in my medical decision making (see chart for details).    This is a 71 year old man with recent coronary artery bypass grafting who presents today with chest pain. Evaluation here reveals no acute ischemic changes on his EKG, he has been hemodynamically stable, and first troponin is normal. Patient's care is discussed with Dr. Acie Fredrickson and have seen and evaluated and will admit patient.  Final Clinical Impressions(s) / ED Diagnoses   Final diagnoses:  Chest pain, unspecified type    New Prescriptions New Prescriptions   No medications on file     Pattricia Boss, MD 05/28/16 1520

## 2016-05-28 NOTE — ED Triage Notes (Signed)
Patient presents today with complaints of Chest pain radiating between shoulder blades and SOB. Patient describes the pain as pressure. States started about 0800. Patient reports he had open heart surgery 3 weeks ago. Patient reports he took some pain medication but does not not what he took. Patient alert and oriented on arrival moving all extremities.

## 2016-05-28 NOTE — ED Notes (Signed)
Cardiology at bedside.

## 2016-05-28 NOTE — ED Notes (Signed)
Patient in Xray

## 2016-05-28 NOTE — ED Notes (Signed)
EKG given to Dr Ray

## 2016-05-28 NOTE — Consult Note (Signed)
CONSULT NOTE  Date: 05/28/2016               Patient Name:  Albert Carter MRN: [id number]  DOB: Dec 21, 1945 Age / Sex: 71 y.o., male        PCP: Purvis Kilts Primary Cardiologist: Caryl Comes             Referring Physician: Hart Robinsons              Reason for Consult: Chest pain following CABG           History of Present Illness: Patient is a 71 y.o. male with a PMHx of CAD,( NSTEMI, Status post recent coronary artery bypass grafting) , who was admitted to [facility] on 05/28/2016 for evaluation of chest pain    Pt presented with a MI about a month ago   He woke up with his angina this am , felt very similar to his previous episodes of angina  Between shoulder blades. Associated With increased shortness of breath. He did not take a nitroglycerin. He also had not been taking his main pain medications because they make him nauseated and constipated. Did not take NTG,  Took a pain pill this morning which resulted in relief of the chest pain.   He's had trouble sleeping at night. He's had trouble getting comfortable in bed.  He does not get a lot of regular exercise.   Retired Education officer, [employer]   Medications: Outpatient medications:  (Not in a hospital admission)  Current medications: No current facility-administered medications for this encounter.    Current Outpatient Prescriptions  Medication Sig Dispense Refill  . aspirin 81 MG chewable tablet Chew 1 tablet (81 mg total) by mouth daily.    Marland Kitchen atorvastatin (LIPITOR) 80 MG tablet Take 1 tablet (80 mg total) by mouth daily at 6 PM. 30 tablet 6  . clopidogrel (PLAVIX) 75 MG tablet Take 1 tablet (75 mg total) by mouth daily. 30 tablet 0  . furosemide (LASIX) 40 MG tablet Take 1 tablet (40 mg total) by mouth daily. FOR 7 DAYS (Patient taking differently: Take 40 mg by mouth daily. ) 7 tablet 0  . isosorbide mononitrate (IMDUR) 60 MG 24 hr tablet Take 60 mg by mouth daily.  6  . lisinopril (PRINIVIL,ZESTRIL) 10 MG tablet Take 1  tablet (10 mg total) by mouth daily. 30 tablet 3  . metoprolol tartrate (LOPRESSOR) 25 MG tablet Take 0.5 tablets (12.5 mg total) by mouth 2 (two) times daily. 30 tablet 6  . nitroGLYCERIN (NITROSTAT) 0.4 MG SL tablet Place 0.4 mg under the tongue every 5 (five) minutes as needed for chest pain.  6  . potassium chloride SA (K-DUR,KLOR-CON) 20 MEQ tablet Take 1 tablet (20 mEq total) by mouth daily. For 7 Days (Patient taking differently: Take 20 mEq by mouth daily. ) 7 tablet 0  . traMADol (ULTRAM) 50 MG tablet Take 1-2 tablets (50-100 mg total) by mouth every 4 (four) hours as needed for moderate pain. 40 tablet 0  . acetaminophen (TYLENOL) 325 MG tablet Take 2 tablets (650 mg total) by mouth every 4 (four) hours as needed for headache or mild pain. (Patient not taking: Reported on 05/04/2016)    . cephALEXin (KEFLEX) 500 MG capsule Take 1 capsule (500 mg total) by mouth 3 (three) times daily. (Patient not taking: Reported on 05/28/2016) 15 capsule 0     Allergies  Allergen Reactions  . Codeine Other (See Comments)    MAKES  HIM SLEEP ALL TIME  . Morphine And Related Nausea And Vomiting     Past Medical History:  Diagnosis Date  . Arthritis    " all through my body"  . Asthma   . Bronchitis   . CAD (coronary artery disease), severe with CABG recommended.  04/25/2016  . Hyperlipemia 04/25/2016  . Hypertension   . NSTEMI (non-ST elevated myocardial infarction) ([place]) 04/23/2016  . Ventricular tachycardia, nonsustained ([place]) 04/23/2016    Past Surgical History:  Procedure Laterality Date  . COLONOSCOPY  03/24/2011   Procedure: COLONOSCOPY;  Surgeon: Daneil Dolin, MD;  [facility];  Service: Endoscopy;  Laterality: N/A;  11:30 AM  . CORONARY ARTERY BYPASS GRAFT N/A 05/10/2016   Procedure: CORONARY ARTERY BYPASS GRAFTING (CABG) x four , using left internal mammary artery and right leg greater saphenous vein harvested endoscopically, and Cryo saphenous vein;  Surgeon: Ivin Poot,  MD;  Location: [facility];  Service: Open Heart Surgery;  Laterality: N/A;  . FACIAL FRACTURE SURGERY  1973   [facility]  . LEFT HEART CATH AND CORONARY ANGIOGRAPHY N/A 04/25/2016   Procedure: Left Heart Cath and Coronary Angiography;  Surgeon: Troy Sine, MD;  Location: [facility] LAB;  Service: Cardiovascular;  Laterality: N/A;  . [place]   broke jaw and reset  . TEE WITHOUT CARDIOVERSION N/A 05/10/2016   Procedure: TRANSESOPHAGEAL ECHOCARDIOGRAM (TEE);  Surgeon: Ivin Poot, MD;  Location: [facility];  Service: Open Heart Surgery;  Laterality: N/A;    Family History  Problem Relation Age of Onset  . Heart disease Mother     Social History:  reports that he has never smoked. He has never used smokeless tobacco. He reports that he does not drink alcohol or use drugs.   Review of Systems: Constitutional:  denies fever, chills, diaphoresis, appetite change and fatigue.  HEENT: denies photophobia, eye pain, redness, hearing loss, ear pain, congestion, sore throat, rhinorrhea, sneezing, neck pain, neck stiffness and tinnitus.  Respiratory: denies SOB, DOE, cough, chest tightness, and wheezing.  Cardiovascular: admits to chest pain,  and leg swelling.  Gastrointestinal: denies nausea, vomiting, abdominal pain, diarrhea, constipation, blood in stool.  Genitourinary: denies dysuria, urgency, frequency, hematuria, flank pain and difficulty urinating.  Musculoskeletal: denies  myalgias, back pain, joint swelling, arthralgias and gait problem.   Skin: denies pallor, rash and wound.  Neurological: denies dizziness, seizures, syncope, weakness, light-headedness, numbness and headaches.   Hematological: denies adenopathy, easy bruising, personal or family bleeding history.  Psychiatric/ Behavioral: denies suicidal ideation, mood changes, confusion, nervousness, sleep disturbance and agitation.    Physical Exam: BP 132/82   Pulse 72   Temp 97.8 F (36.6 C) (Oral)   Resp (!) 39    SpO2 98%   Wt Readings from Last 3 Encounters:  05/17/16 253 lb 12.8 oz (115.1 kg)  05/06/16 260 lb 3.2 oz (118 kg)  05/03/16 255 lb (115.7 kg)    General: Vital signs reviewed and noted. Well-developed, well-nourished, in no acute distress; alert,   Head: Normocephalic, atraumatic, sclera anicteric,   Neck: Supple. Negative for carotid bruits. No JVD   Lungs:  Clear bilaterally, no  wheezes, rales, or rhonchi. Breathing is normal   Heart: RRR with S1 S2. No murmurs, rubs, or gallops , sternum appears to be healing well   Abdomen/ GI :  Soft, non-tender, non-distended with normoactive bowel sounds. No hepatomegaly. No rebound/guarding. No obvious abdominal masses   MSK: Strength and the appear  normal for age.   Extremities: No clubbing or cyanosis. Mild right leg edema.  Distal pedal pulses are 2+ and equal   Neurologic:  CN are grossly intact,  No obvious motor or sensory defect.  Alert and oriented X 3. Moves all extremities spontaneously.  Psych: Responds to questions appropriately with a normal affect.     Lab results: Basic Metabolic Panel:  Recent Labs Lab 05/28/16 1018  NA 135  K 4.6  CL 102  CO2 24  GLUCOSE 173*  BUN 12  CREATININE 0.88  CALCIUM 9.3    Liver Function Tests:  Recent Labs Lab 05/28/16 1018  AST 27  ALT 22  ALKPHOS 76  BILITOT 1.1  PROT 6.9  ALBUMIN 3.2*   No results for input(s): LIPASE, AMYLASE in the last 168 hours. No results for input(s): AMMONIA in the last 168 hours.  CBC:  Recent Labs Lab 05/28/16 1017  WBC 6.7  HGB 12.2*  HCT 37.8*  MCV 95.0  PLT 351    Cardiac Enzymes: No results for input(s): CKTOTAL, CKMB, CKMBINDEX, TROPONINI in the last 168 hours.  BNP: Invalid input(s): POCBNP  CBG: No results for input(s): GLUCAP in the last 168 hours.  Coagulation Studies:  Recent Labs  05/28/16 1018  LABPROT 15.5*  INR 1.22     Other results:  Personal review of EKG shows :   NSR , TWI in the lateral leads     Imaging: Dg Chest 2 View  Result Date: 05/28/2016 CLINICAL DATA:  Left chest and back pain and right arm and shoulder pain today. EXAM: CHEST  2 VIEW COMPARISON:  05/13/2016. FINDINGS: Enlarged cardiac silhouette with improvement. Stable post CABG changes. Decreased linear density at the lung bases. One of the pleural effusions has resolved with a small effusion remaining on one of the sides on the lateral view. Thoracic spine degenerative changes. IMPRESSION: 1. Improving bibasilar atelectasis. 2. Improving cardiomegaly and pleural fluid. Electronically Signed   By: Claudie Revering M.D.   On: 05/28/2016 10:39        Assessment & Plan:   1. Chest pain: Patient presents with episodes of chest pain that are very similar to his presenting episodes of angina last month. The pain lasted for approximately 2 hours. His troponin level is 0.06 which is slightly higher than his previous episode of troponin.  I personally reviewed his cardiac cath images. He has lots of diffuse disease in it's quite likely that he may have had some angina from one of the small vessels.  We will continue to check  cardiac enzymes / troponins . Will admit him for observation tonight. I anticipate discharge tomorrow if he feels well and if his enzymes do not increase any further. He will follow-up with [place] and with TCTS as an outpatient.  Will check an ESR .  - pain does not really sound pleuritic however.   2. Hyperlipidemia: Continue current medications  3. Essential hypertension: Continue current medications      Thayer Headings, Brooke Bonito., MD, [facility] 05/28/2016, 2:31 PM Office - [phone] Pager [phone]

## 2016-05-28 NOTE — H&P (Signed)
Admission  NOTE  Date: 05/28/2016               Patient Name:  Albert Carter MRN: [id number]  DOB: 11/30/1945 Age / Sex: 72 y.o., male        PCP: Purvis Kilts Primary Cardiologist: Caryl Comes             Referring Physician: Hart Robinsons              Reason for Consult: Chest pain following CABG           History of Present Illness: Patient is a 71 y.o. male with a PMHx of CAD,( NSTEMI, Status post recent coronary artery bypass grafting) , who was admitted to [facility] on 05/28/2016 for evaluation of chest pain    Pt presented with a MI about a month ago   He woke up with his angina this am , felt very similar to his previous episodes of angina  Between shoulder blades. Associated With increased shortness of breath. He did not take a nitroglycerin. He also had not been taking his main pain medications because they make him nauseated and constipated. Did not take NTG,  Took a pain pill this morning which resulted in relief of the chest pain.   He's had trouble sleeping at night. He's had trouble getting comfortable in bed.  He does not get a lot of regular exercise.   Retired Education officer, museum   Medications: Outpatient medications:  (Not in a hospital admission)  Current medications: No current facility-administered medications for this encounter.          Current Outpatient Prescriptions  Medication Sig Dispense Refill  . aspirin 81 MG chewable tablet Chew 1 tablet (81 mg total) by mouth daily.    Marland Kitchen atorvastatin (LIPITOR) 80 MG tablet Take 1 tablet (80 mg total) by mouth daily at 6 PM. 30 tablet 6  . clopidogrel (PLAVIX) 75 MG tablet Take 1 tablet (75 mg total) by mouth daily. 30 tablet 0  . furosemide (LASIX) 40 MG tablet Take 1 tablet (40 mg total) by mouth daily. FOR 7 DAYS (Patient taking differently: Take 40 mg by mouth daily. ) 7 tablet 0  . isosorbide mononitrate (IMDUR) 60 MG 24 hr tablet Take 60 mg by  mouth daily.  6  . lisinopril (PRINIVIL,ZESTRIL) 10 MG tablet Take 1 tablet (10 mg total) by mouth daily. 30 tablet 3  . metoprolol tartrate (LOPRESSOR) 25 MG tablet Take 0.5 tablets (12.5 mg total) by mouth 2 (two) times daily. 30 tablet 6  . nitroGLYCERIN (NITROSTAT) 0.4 MG SL tablet Place 0.4 mg under the tongue every 5 (five) minutes as needed for chest pain.  6  . potassium chloride SA (K-DUR,KLOR-CON) 20 MEQ tablet Take 1 tablet (20 mEq total) by mouth daily. For 7 Days (Patient taking differently: Take 20 mEq by mouth daily. ) 7 tablet 0  . traMADol (ULTRAM) 50 MG tablet Take 1-2 tablets (50-100 mg total) by mouth every 4 (four) hours as needed for moderate pain. 40 tablet 0  . acetaminophen (TYLENOL) 325 MG tablet Take 2 tablets (650 mg total) by mouth every 4 (four) hours as needed for headache or mild pain. (Patient not taking: Reported on 05/04/2016)    . cephALEXin (KEFLEX) 500 MG capsule Take 1 capsule (500 mg total) by mouth 3 (three) times daily. (Patient not taking: Reported on 05/28/2016) 15 capsule 0          Allergies  Allergen Reactions  . Codeine Other (See Comments)    MAKES HIM SLEEP ALL TIME  . Morphine And Related Nausea And Vomiting         Past Medical History:  Diagnosis Date  . Arthritis    " all through my body"  . Asthma   . Bronchitis   . CAD (coronary artery disease), severe with CABG recommended.  04/25/2016  . Hyperlipemia 04/25/2016  . Hypertension   . NSTEMI (non-ST elevated myocardial infarction) ([place]) 04/23/2016  . Ventricular tachycardia, nonsustained ([place]) 04/23/2016         Past Surgical History:  Procedure Laterality Date  . COLONOSCOPY  03/24/2011   Procedure: COLONOSCOPY;  Surgeon: Daneil Dolin, MD;  [facility];  Service: Endoscopy;  Laterality: N/A;  11:30 AM  . CORONARY ARTERY BYPASS GRAFT N/A 05/10/2016   Procedure: CORONARY ARTERY BYPASS GRAFTING (CABG) x four , using left internal mammary artery and right  leg greater saphenous vein harvested endoscopically, and Cryo saphenous vein;  Surgeon: Ivin Poot, MD;  Location: [place];  Service: Open Heart Surgery;  Laterality: N/A;  . FACIAL FRACTURE SURGERY  1973   [facility]  . LEFT HEART CATH AND CORONARY ANGIOGRAPHY N/A 04/25/2016   Procedure: Left Heart Cath and Coronary Angiography;  Surgeon: Troy Sine, MD;  Location: [facility];  Service: Cardiovascular;  Laterality: N/A;  . [place]   broke jaw and reset  . TEE WITHOUT CARDIOVERSION N/A 05/10/2016   Procedure: TRANSESOPHAGEAL ECHOCARDIOGRAM (TEE);  Surgeon: Ivin Poot, MD;  Location: [place];  Service: Open Heart Surgery;  Laterality: N/A;         Family History  Problem Relation Age of Onset  . Heart disease Mother     Social History:  reports that he has never smoked. He has never used smokeless tobacco. He reports that he does not drink alcohol or use drugs.   Review of Systems: Constitutional:  denies fever, chills, diaphoresis, appetite change and fatigue.  HEENT: denies photophobia, eye pain, redness, hearing loss, ear pain, congestion, sore throat, rhinorrhea, sneezing, neck pain, neck stiffness and tinnitus.  Respiratory: denies SOB, DOE, cough, chest tightness, and wheezing.  Cardiovascular: admits to chest pain,  and leg swelling.  Gastrointestinal: denies nausea, vomiting, abdominal pain, diarrhea, constipation, blood in stool.  Genitourinary: denies dysuria, urgency, frequency, hematuria, flank pain and difficulty urinating.  Musculoskeletal: denies  myalgias, back pain, joint swelling, arthralgias and gait problem.   Skin: denies pallor, rash and wound.  Neurological: denies dizziness, seizures, syncope, weakness, light-headedness, numbness and headaches.   Hematological: denies adenopathy, easy bruising, personal or family bleeding history.  Psychiatric/ Behavioral: denies suicidal ideation, mood changes, confusion, nervousness, sleep  disturbance and agitation.    Physical Exam: BP 132/82   Pulse 72   Temp 97.8 F (36.6 C) (Oral)   Resp (!) 39   SpO2 98%      Wt Readings from Last 3 Encounters:  05/17/16 253 lb 12.8 oz (115.1 kg)  05/06/16 260 lb 3.2 oz (118 kg)  05/03/16 255 lb (115.7 kg)    General: Vital signs reviewed and noted. Well-developed, well-nourished, in no acute distress; alert,   Head: Normocephalic, atraumatic, sclera anicteric,   Neck: Supple. Negative for carotid bruits. No JVD   Lungs:  Clear bilaterally, no  wheezes, rales, or rhonchi. Breathing is normal   Heart: RRR with S1 S2. No murmurs, rubs, or gallops , sternum appears to be healing  well   Abdomen/ GI :  Soft, non-tender, non-distended with normoactive bowel sounds. No hepatomegaly. No rebound/guarding. No obvious abdominal masses   MSK: Strength and the appear normal for age.   Extremities: No clubbing or cyanosis. Mild right leg edema.  Distal pedal pulses are 2+ and equal   Neurologic:  CN are grossly intact,  No obvious motor or sensory defect.  Alert and oriented X 3. Moves all extremities spontaneously.  Psych: Responds to questions appropriately with a normal affect.     Lab results: Basic Metabolic Panel:  Last Labs    Recent Labs Lab 05/28/16 1018  NA 135  K 4.6  CL 102  CO2 24  GLUCOSE 173*  BUN 12  CREATININE 0.88  CALCIUM 9.3      Liver Function Tests:  Last Labs    Recent Labs Lab 05/28/16 1018  AST 27  ALT 22  ALKPHOS 76  BILITOT 1.1  PROT 6.9  ALBUMIN 3.2*     Last Labs   No results for input(s): LIPASE, AMYLASE in the last 168 hours.   Last Labs   No results for input(s): AMMONIA in the last 168 hours.    CBC:  Last Labs    Recent Labs Lab 05/28/16 1017  WBC 6.7  HGB 12.2*  HCT 37.8*  MCV 95.0  PLT 351      Cardiac Enzymes: Last Labs   No results for input(s): CKTOTAL, CKMB, CKMBINDEX, TROPONINI in the last 168 hours.    BNP: Last Labs   Invalid  input(s): POCBNP    CBG: Last Labs   No results for input(s): GLUCAP in the last 168 hours.    Coagulation Studies:  Recent Labs (last 2 labs)    Recent Labs  05/28/16 1018  LABPROT 15.5*  INR 1.22       Other results:  Personal review of EKG shows :   NSR , TWI in the lateral leads    Imaging: Imaging Results (Last 48 hours)   Dg Chest 2 View  Result Date: 05/28/2016 CLINICAL DATA:  Left chest and back pain and right arm and shoulder pain today. EXAM: CHEST  2 VIEW COMPARISON:  05/13/2016. FINDINGS: Enlarged cardiac silhouette with improvement. Stable post CABG changes. Decreased linear density at the lung bases. One of the pleural effusions has resolved with a small effusion remaining on one of the sides on the lateral view. Thoracic spine degenerative changes. IMPRESSION: 1. Improving bibasilar atelectasis. 2. Improving cardiomegaly and pleural fluid. Electronically Signed   By: Claudie Revering M.D.   On: 05/28/2016 10:39         Assessment & Plan:   1. Chest pain: Patient presents with episodes of chest pain that are very similar to his presenting episodes of angina last month. The pain lasted for approximately 2 hours. His troponin level is 0.06 which is slightly higher than his previous episode of troponin.  I personally reviewed his cardiac cath images. He has lots of diffuse disease in it's quite likely that he may have had some angina from one of the small vessels.  We will continue to check  cardiac enzymes / troponins . Will admit him for observation tonight. I anticipate discharge tomorrow if he feels well and if his enzymes do not increase any further. He will follow-up with [place] and with [facility]CTS as an outpatient.  Will check an ESR .  - pain does not really sound pleuritic however.   2. Hyperlipidemia: Continue current medications  3. Essential hypertension: Continue current medications      Thayer Headings, Brooke Bonito., MD,  [facility] 05/28/2016, 2:31 PM Office - [phone] Pager [phone]

## 2016-05-28 NOTE — Progress Notes (Signed)
ANTICOAGULATION CONSULT NOTE - Initial Consult  Pharmacy Consult for Lovenox Indication: chest pain/ACS  Allergies  Allergen Reactions  . Codeine Other (See Comments)    MAKES HIM SLEEP ALL TIME  . Morphine And Related Nausea And Vomiting    Patient Measurements: Height: 5\' 8"  (172.7 cm) Weight: 244 lb 0.8 oz (110.7 kg) IBW/kg (Calculated) : 68.4  Vital Signs: Temp: 98 F (36.7 C) (03/17 1700) Temp Source: Oral (03/17 1700) BP: 121/78 (03/17 1600) Pulse Rate: 72 (03/17 1600)  Labs:  Recent Labs  05/28/16 1017 05/28/16 1018 05/28/16 1809  HGB 12.2*  --   --   HCT 37.8*  --   --   PLT 351  --   --   APTT  --  35  --   LABPROT  --  15.5*  --   INR  --  1.22  --   CREATININE  --  0.88  --   TROPONINI  --   --  3.37*    Estimated Creatinine Clearance: 94.2 mL/min (by C-G formula based on SCr of 0.88 mg/dL).   Medical History: Past Medical History:  Diagnosis Date  . Arthritis    " all through my body"  . Asthma   . Bronchitis   . CAD (coronary artery disease), severe with CABG recommended.  04/25/2016  . Hyperlipemia 04/25/2016  . Hypertension   . NSTEMI (non-ST elevated myocardial infarction) ([place]) 04/23/2016  . Ventricular tachycardia, nonsustained ([place]) 04/23/2016    Assessment: 83 YOM who presented on 3/17 with CP/NSTEMI. Pharmacy has been consulted to start Lovenox for anticoagulation while awaiting further cardiology work-up. The patient was note to be on plavix/ASA PTA but no other blood thinners. Wt: 110 kg, SCr 0.88, CrCl~80-100 ml/min.   The patient received a dose of Lovenox 40 mg @ 1730 this evening - will only give 70 mg for the first dose for a total dose of 110 mg.   Goal of Therapy:  Anti-Xa level 0.6-1 units/ml 4hrs after LMWH dose given Monitor platelets by anticoagulation protocol: Yes   Plan:  1. Lovenox 70 mg SQ x 1 now 2. Start Lovenox 110 mg SQ every 12 hours on 3/18 at 0800 3. Will f/u cardiology plans for further evaluation 4.  Will monitor renal function, CBC  Thank you for allowing pharmacy to be a part of this patient's care.  Alycia Rossetti, PharmD, BCPS Clinical Pharmacist Pager: [phone] 05/28/2016 8:18 PM

## 2016-05-28 NOTE — ED Notes (Signed)
Patient returned placed on the montior no pain at this time.

## 2016-05-29 ENCOUNTER — Encounter (HOSPITAL_COMMUNITY): Payer: Self-pay | Admitting: *Deleted

## 2016-05-29 DIAGNOSIS — I200 Unstable angina: Secondary | ICD-10-CM

## 2016-05-29 DIAGNOSIS — I10 Essential (primary) hypertension: Secondary | ICD-10-CM | POA: Diagnosis present

## 2016-05-29 DIAGNOSIS — E785 Hyperlipidemia, unspecified: Secondary | ICD-10-CM | POA: Diagnosis present

## 2016-05-29 DIAGNOSIS — R079 Chest pain, unspecified: Secondary | ICD-10-CM | POA: Diagnosis not present

## 2016-05-29 DIAGNOSIS — Z885 Allergy status to narcotic agent status: Secondary | ICD-10-CM | POA: Diagnosis not present

## 2016-05-29 DIAGNOSIS — M1990 Unspecified osteoarthritis, unspecified site: Secondary | ICD-10-CM | POA: Diagnosis present

## 2016-05-29 DIAGNOSIS — I252 Old myocardial infarction: Secondary | ICD-10-CM | POA: Diagnosis not present

## 2016-05-29 DIAGNOSIS — Z79899 Other long term (current) drug therapy: Secondary | ICD-10-CM | POA: Diagnosis not present

## 2016-05-29 DIAGNOSIS — Z7982 Long term (current) use of aspirin: Secondary | ICD-10-CM | POA: Diagnosis not present

## 2016-05-29 DIAGNOSIS — I214 Non-ST elevation (NSTEMI) myocardial infarction: Secondary | ICD-10-CM

## 2016-05-29 DIAGNOSIS — Z7902 Long term (current) use of antithrombotics/antiplatelets: Secondary | ICD-10-CM | POA: Diagnosis not present

## 2016-05-29 DIAGNOSIS — Z8249 Family history of ischemic heart disease and other diseases of the circulatory system: Secondary | ICD-10-CM | POA: Diagnosis not present

## 2016-05-29 DIAGNOSIS — I25119 Atherosclerotic heart disease of native coronary artery with unspecified angina pectoris: Secondary | ICD-10-CM | POA: Diagnosis present

## 2016-05-29 DIAGNOSIS — Z951 Presence of aortocoronary bypass graft: Secondary | ICD-10-CM | POA: Diagnosis not present

## 2016-05-29 DIAGNOSIS — I2510 Atherosclerotic heart disease of native coronary artery without angina pectoris: Secondary | ICD-10-CM | POA: Diagnosis not present

## 2016-05-29 DIAGNOSIS — J45909 Unspecified asthma, uncomplicated: Secondary | ICD-10-CM | POA: Diagnosis present

## 2016-05-29 LAB — COMPREHENSIVE METABOLIC PANEL
ALK PHOS: 78 U/L (ref 38–126)
ALT: 22 U/L (ref 17–63)
AST: 32 U/L (ref 15–41)
Albumin: 3.1 g/dL — ABNORMAL LOW (ref 3.5–5.0)
Anion gap: 7 (ref 5–15)
BUN: 16 mg/dL (ref 6–20)
CHLORIDE: 101 mmol/L (ref 101–111)
CO2: 25 mmol/L (ref 22–32)
CREATININE: 0.9 mg/dL (ref 0.61–1.24)
Calcium: 9.1 mg/dL (ref 8.9–10.3)
GFR calc Af Amer: >60 mL/min (ref >60)
Glucose, Bld: 143 mg/dL — ABNORMAL HIGH (ref 65–99)
Potassium: 4.1 mmol/L (ref 3.5–5.1)
Sodium: 133 mmol/L — ABNORMAL LOW (ref 135–145)
Total Bilirubin: 0.8 mg/dL (ref 0.3–1.2)
Total Protein: 6.7 g/dL (ref 6.5–8.1)

## 2016-05-29 LAB — LIPID PANEL
CHOL/HDL RATIO: 3.1 ratio
Cholesterol: 100 mg/dL (ref 0–200)
HDL: 32 mg/dL — AB (ref >40)
LDL CALC: 48 mg/dL (ref 0–99)
Triglycerides: 102 mg/dL (ref <150)
VLDL: 20 mg/dL (ref 0–40)

## 2016-05-29 LAB — TROPONIN I
TROPONIN I: 2.77 ng/mL — AB (ref <0.03)
Troponin I: 1.95 ng/mL (ref <0.03)

## 2016-05-29 MED ORDER — SODIUM CHLORIDE 0.9% FLUSH: 3.0000 mL | INTRAVENOUS | Status: DC | PRN

## 2016-05-29 MED ORDER — ASPIRIN 81 MG PO CHEW
81.0000 mg | CHEWABLE_TABLET | ORAL | Status: AC
  Administered 2016-05-30: 81 mg via ORAL
  Filled 2016-05-29: qty 1

## 2016-05-29 MED ORDER — SODIUM CHLORIDE 0.9 % WEIGHT BASED INFUSION: 3.0000 mL/kg/h | INTRAVENOUS | Status: AC

## 2016-05-29 MED ORDER — SODIUM CHLORIDE 0.9 % WEIGHT BASED INFUSION: 1.0000 mL/kg/h | INTRAVENOUS | Status: DC

## 2016-05-29 MED ORDER — SODIUM CHLORIDE 0.9% FLUSH
3.0000 mL | Freq: Two times a day (BID) | INTRAVENOUS | Status: DC
  Administered 2016-05-29: 3 mL via INTRAVENOUS

## 2016-05-29 MED ORDER — SODIUM CHLORIDE 0.9 % IV SOLN
250.0000 mL | INTRAVENOUS | Status: DC | PRN
Start: 2016-05-29 — End: 2016-05-30

## 2016-05-29 NOTE — Progress Notes (Signed)
Progress Note  Patient Name: Albert Carter Date of Encounter: 05/29/2016  Primary Cardiologist: Caryl Comes  Subjective   71 yo with hx of CAD / CABG Admitted with CD Has ruled in for NSTEMI Was started on Lovenox last night ` Pain has resolved this am    Inpatient Medications    Scheduled Meds: . aspirin  81 mg Oral Daily  . atorvastatin  80 mg Oral q1800  . clopidogrel  75 mg Oral Daily  . enoxaparin (LOVENOX) injection  110 mg Subcutaneous Q12H  . isosorbide mononitrate  60 mg Oral Daily  . lisinopril  10 mg Oral Daily  . metoprolol tartrate  12.5 mg Oral BID   Continuous Infusions:  PRN Meds: acetaminophen, ALPRAZolam, nitroGLYCERIN, ondansetron (ZOFRAN) IV, traMADol, zolpidem   Vital Signs    Vitals:   05/28/16 2200 05/29/16 0013 05/29/16 0400 05/29/16 0540  BP: 97/72 94/72 101/69 113/90  Pulse: 87  80 85  Resp: (!) 22 18 (!) 23 20  Temp:  97.4 F (36.3 C)  97.7 F (36.5 C)  TempSrc:  Oral  Oral  SpO2: 91% 95% 93% 95%  Weight:    245 lb (111.1 kg)  Height:        Intake/Output Summary (Last 24 hours) at 05/29/16 0804 Last data filed at 05/28/16 2000  Gross per 24 hour  Intake              390 ml  Output              375 ml  Net               15 ml   Filed Weights   05/28/16 1700 05/29/16 0540  Weight: 244 lb 0.8 oz (110.7 kg) 245 lb (111.1 kg)    Telemetry    NSR / sinus tach  - Personally Reviewed  ECG    NSR  -  , poor R wave progression.   TWI in inf. And lateral leads.   Personally Reviewed  Physical Exam   GEN: No acute distress.   Neck: No JVD Cardiac: RRR, no murmurs, rubs, or gallops.  Respiratory: Clear to auscultation bilaterally. GI: Soft, nontender, non-distended  MS: No edema; No deformity. Neuro:  Nonfocal  Psych: Normal affect   Labs    Chemistry Recent Labs Lab 05/28/16 1018 05/29/16 0444  NA 135 133*  K 4.6 4.1  CL 102 101  CO2 24 25  GLUCOSE 173* 143*  BUN 12 16  CREATININE 0.88 0.90  CALCIUM 9.3 9.1    PROT 6.9 6.7  ALBUMIN 3.2* 3.1*  AST 27 32  ALT 22 22  ALKPHOS 76 78  BILITOT 1.1 0.8  GFRNONAA >60 >60  GFRAA >60 >60  ANIONGAP 9 7     Hematology Recent Labs Lab 05/28/16 1017  WBC 6.7  RBC 3.98*  HGB 12.2*  HCT 37.8*  MCV 95.0  MCH 30.7  MCHC 32.3  RDW 14.4  PLT 351    Cardiac Enzymes Recent Labs Lab 05/28/16 1809 05/29/16 0014 05/29/16 0444  TROPONINI 3.37* 2.77* 1.95*    Recent Labs Lab 05/28/16 1014  TROPIPOC 0.06     BNPNo results for input(s): BNP, PROBNP in the last 168 hours.   DDimer No results for input(s): DDIMER in the last 168 hours.   Radiology    Dg Chest 2 View  Result Date: 05/28/2016 CLINICAL DATA:  Left chest and back pain and right arm and shoulder pain today. EXAM:  CHEST  2 VIEW COMPARISON:  05/13/2016. FINDINGS: Enlarged cardiac silhouette with improvement. Stable post CABG changes. Decreased linear density at the lung bases. One of the pleural effusions has resolved with a small effusion remaining on one of the sides on the lateral view. Thoracic spine degenerative changes. IMPRESSION: 1. Improving bibasilar atelectasis. 2. Improving cardiomegaly and pleural fluid. Electronically Signed   By: Claudie Revering M.D.   On: 05/28/2016 10:39    Cardiac Studies      Patient Profile     71 y.o. male with recent CABG Admitted with return of his angina .  Assessment & Plan    1. CAD :   Pt presents with his typical angina  Has ruled in for NSTEMI. Will allow him to eat today  And schedule cath tomorrow .     2. Hyperlipidemia :  Lipids look great   3.   Essential HTN:   Continue meds.      Signed, Mertie Moores, MD  05/29/2016, 8:04 AM

## 2016-05-29 NOTE — Care Management (Signed)
Pt alert and oriented - states he stays with wife and is independent.  Pt continues to refuse HH.  CM will continue to follow for discharge needs

## 2016-05-30 ENCOUNTER — Encounter: Payer: Medicare Other | Admitting: Physician Assistant

## 2016-05-30 ENCOUNTER — Encounter (HOSPITAL_COMMUNITY)
Admission: EM | Discharge status: Against Medical Advice | Payer: Self-pay | Source: Home / Self Care | Attending: Cardiovascular Disease

## 2016-05-30 DIAGNOSIS — I2510 Atherosclerotic heart disease of native coronary artery without angina pectoris: Secondary | ICD-10-CM

## 2016-05-30 SURGERY — LEFT HEART CATH AND CORS/GRAFTS ANGIOGRAPHY
Anesthesia: LOCAL

## 2016-05-30 NOTE — Progress Notes (Signed)
Pt refused to have cath this AM. Cath lab nurse  in room to talk to pt, but he refused. Refused pre-cath prep. Pre-cath fluids not initiated. Pt has been trying to leave since last night, calling family to come pick him up. Required frequent education reinforcement.

## 2016-05-30 NOTE — Progress Notes (Signed)
Progress Note  Patient Name: Albert Carter Date of Encounter: 05/30/2016  Primary Cardiologist: Olin Pia  Subjective   " I want to go now"  " I have neverr been right"  Inpatient Medications    Scheduled Meds: . aspirin  81 mg Oral Daily  . atorvastatin  80 mg Oral q1800  . clopidogrel  75 mg Oral Daily  . enoxaparin (LOVENOX) injection  110 mg Subcutaneous Q12H  . isosorbide mononitrate  60 mg Oral Daily  . lisinopril  10 mg Oral Daily  . metoprolol tartrate  12.5 mg Oral BID  . sodium chloride flush  3 mL Intravenous Q12H   Continuous Infusions: . sodium chloride     PRN Meds: sodium chloride, acetaminophen, ALPRAZolam, nitroGLYCERIN, ondansetron (ZOFRAN) IV, sodium chloride flush, traMADol, zolpidem   Vital Signs    Vitals:   05/29/16 0800 05/29/16 1417 05/29/16 2002 05/29/16 2113  BP: 106/66 111/74 116/62   Pulse: 85 91 89 88  Resp: (!) 22 17 (!) 21   Temp:  98.1 F (36.7 C) 98.3 F (36.8 C)   TempSrc:  Oral Oral   SpO2: 93% 94% 98%   Weight:      Height:        Intake/Output Summary (Last 24 hours) at 05/30/16 0740 Last data filed at 05/30/16 0500  Gross per 24 hour  Intake              520 ml  Output              575 ml  Net              -55 ml   Filed Weights   05/28/16 1700 05/29/16 0540  Weight: 244 lb 0.8 oz (110.7 kg) 245 lb (111.1 kg)    Telemetry    SR   - Personally Reviewed  ECG      Physical Exam   GEN: No acute distress.    Differed  Pt in street clothes    Labs    Chemistry Recent Labs Lab 05/28/16 1018 05/29/16 0444  NA 135 133*  K 4.6 4.1  CL 102 101  CO2 24 25  GLUCOSE 173* 143*  BUN 12 16  CREATININE 0.88 0.90  CALCIUM 9.3 9.1  PROT 6.9 6.7  ALBUMIN 3.2* 3.1*  AST 27 32  ALT 22 22  ALKPHOS 76 78  BILITOT 1.1 0.8  GFRNONAA >60 >60  GFRAA >60 >60  ANIONGAP 9 7     Hematology Recent Labs Lab 05/28/16 1017  WBC 6.7  RBC 3.98*  HGB 12.2*  HCT 37.8*  MCV 95.0  MCH 30.7  MCHC 32.3  RDW 14.4   PLT 351    Cardiac Enzymes Recent Labs Lab 05/28/16 1809 05/29/16 0014 05/29/16 0444  TROPONINI 3.37* 2.77* 1.95*    Recent Labs Lab 05/28/16 1014  TROPIPOC 0.06     BNPNo results for input(s): BNP, PROBNP in the last 168 hours.   DDimer No results for input(s): DDIMER in the last 168 hours.   Radiology    Dg Chest 2 View  Result Date: 05/28/2016 CLINICAL DATA:  Left chest and back pain and right arm and shoulder pain today. EXAM: CHEST  2 VIEW COMPARISON:  05/13/2016. FINDINGS: Enlarged cardiac silhouette with improvement. Stable post CABG changes. Decreased linear density at the lung bases. One of the pleural effusions has resolved with a small effusion remaining on one of the sides on the lateral view. Thoracic spine  degenerative changes. IMPRESSION: 1. Improving bibasilar atelectasis. 2. Improving cardiomegaly and pleural fluid. Electronically Signed   By: Claudie Revering M.D.   On: 05/28/2016 10:39    Cardiac Studies     Patient Profile     71 y.o. male with CAD (s/p CABG  Admitted with CP  Ruled in for MI    Assessment & Plan    1  CAD  Pt with CABG in 2018  Cath in Feb 2018 with NSTEMI  This showed severe dz.   Severe multivessel CAD with 40% ostial narrowing of the left main; diffuse multiple LAD stenoses of 30, 70 and 70% proximally, 80 and 90% in the mid segment, and 95% distal-apical with 50% first diagonal stenosis; 70% ostial stenosis in the left circumflex vessel with 95% mid AV groove stenosis prior to bifurcating into a large OM branch; and 30% proximal RCA with 50-60% ostial PLA stenosis. Pt underwent CABG (LIMA to LAD; SVG to D; SVG to Cx: SVG to PDA)  Now with CP and NSTEMI  Pt wants to go home  Does not want cath  Explained that symptoms concernnig for graft problem   Explained risk/benefit of cath vs medical Rx  Pt understands  Wants to go home AMA  Would d/c on current meds Will make sure that pt is set up with appt in clinic   2  HL   Continue  lipitor    3  HTN  BP is OK   Continue meds    Signed, Dorris Carnes, MD  05/30/2016, 7:40 AM

## 2016-05-30 NOTE — Progress Notes (Signed)
Patient not wanting to stay for cardiac cath today, wanting to leave Against Medical Advise. PA Hammond up to see patient.  Dr. Harrington Challenger up to patient.  Patient continues to want to leave.  Family at bedside.  AMA paper signed.  Patient left AMA with family.  Sanda Linger

## 2016-06-02 ENCOUNTER — Telehealth: Payer: Self-pay | Admitting: *Deleted

## 2016-06-02 ENCOUNTER — Telehealth: Payer: Self-pay | Admitting: Cardiovascular Disease

## 2016-06-02 NOTE — Telephone Encounter (Signed)
Mr. Albert Carter had CABG X 4 05/10/16 and was discharged 05/17/16. He was readmitted on 05/28/16 with angina and ruled in for NSTEMI. He left AMA, but I feel he was confused about the plan by the cardiologist. They were wanting to do a cath and he only heard "another operation and I will no do that." I explained that it was only a cath to find out about his grafts and why he was still having chest pain and had had a NSTEMI. He denied having a heart attack but probably did not understand what he was told. He wants permission to drive. He does not have a f/u with Dr. Prescott Gum until 06/15/16. I informed him of his f/u apt. with cardiology on 06/10/16. I said he should ask them about driving since he just recently had chest pain/MI. He said he would call them "or just start driving"

## 2016-06-02 NOTE — Telephone Encounter (Signed)
New Message     Pt wants to schedule having the stent put in. Please call to schedule this procedure

## 2016-06-02 NOTE — Telephone Encounter (Signed)
Spoke with patient to assess his symptoms. He states he continues to have right side chest pain that is worse when he moves his shoulder. He states pain is not persistent. I asked if this is the same pain he experienced prior to his CABG and when he was in the ED last week; he confirms. He denies other symptoms. He states he left the hospital AMA due to a misunderstanding that his chest would be reopened for the catheterization. He states he now understands that the catheter will go in through his wrist. I discussed with Dr. Acie Fredrickson, who is in the office, and he advised patient come in for an appointment tomorrow if he is not having persistent pain now. Patient verbalized agreement to come for appointment tomorrow at 2:00 with Dr. Acie Fredrickson. He thanked me for the call.

## 2016-06-03 ENCOUNTER — Encounter: Payer: Self-pay | Admitting: Cardiovascular Disease

## 2016-06-03 ENCOUNTER — Encounter: Payer: Self-pay | Admitting: Nurse Practitioner

## 2016-06-03 ENCOUNTER — Ambulatory Visit (INDEPENDENT_AMBULATORY_CARE_PROVIDER_SITE_OTHER): Payer: Medicare Other | Admitting: Cardiovascular Disease

## 2016-06-03 VITALS — BP 104/70 | HR 91 | Ht 68.0 in

## 2016-06-03 DIAGNOSIS — I25110 Atherosclerotic heart disease of native coronary artery with unstable angina pectoris: Secondary | ICD-10-CM

## 2016-06-03 NOTE — Patient Instructions (Signed)
Medication Instructions:  Your physician recommends that you continue on your current medications as directed. Please refer to the Current Medication list given to you today.   Labwork: TODAY - CBC, BMET, Pt/INR   Testing/Procedures: Your physician has requested that you have a cardiac catheterization. Cardiac catheterization is used to diagnose and/or treat various heart conditions. Doctors may recommend this procedure for a number of different reasons. The most common reason is to evaluate chest pain. Chest pain can be a symptom of coronary artery disease (CAD), and cardiac catheterization can show whether plaque is narrowing or blocking your heart's arteries. This procedure is also used to evaluate the valves, as well as measure the blood flow and oxygen levels in different parts of your heart. For further information please visit [email or web address]. Please follow instruction sheet, as given.   Follow-Up: Your physician wants you to follow-up in: 3 months with Dr. Acie Fredrickson.  You will receive a reminder letter in the mail two months in advance. If you don't receive a letter, please call our office to schedule the follow-up appointment.    If you need a refill on your cardiac medications before your next appointment, please call your pharmacy.   Thank you for choosing [facility]! Christen Bame, RN [phone]

## 2016-06-03 NOTE — Progress Notes (Signed)
 Cardiology Office Note   Date:  06/03/2016   ID:  Albert Carter, DOB 01/19/1946, MRN [id number]  PCP:  GOLDING, JOHN CABOT, MD  Cardiologist:   Mathias Bogacki, MD   Chief Complaint  Patient presents with  . Coronary Artery Disease   Problem list 1. Coronary artery disease- status post coronary artery bypass grafting 2. Hyperlipidemia 3.   History of Present Illness: Albert Carter is a 70 y.o. male who presents for follow-up for recent hospitalization for non-ST segment elevation myocardial infarction.  I met Mr. Benecke in the hospital on March 17. He presented with a history of coronary artery disease status post recent coronary artery bypass grafting. He presented with episodes of chest pain that were very similar to his presenting angina. He ruled in for myocardial infarction. The next morning he apparently panicked and did not want to have a heart catheterization. He thought that he was going straight to redo bypass grafting. He left the hospital AMA.  He is still having a dull ache - comes and goes.  Not taking an NTG    Past Medical History:  Diagnosis Date  . Arthritis    " all through my body"  . Asthma   . Bronchitis   . CAD (coronary artery disease), severe with CABG recommended.  04/25/2016  . Hyperlipemia 04/25/2016  . Hypertension   . NSTEMI (non-ST elevated myocardial infarction) (HCC) 04/23/2016  . Ventricular tachycardia, nonsustained (HCC) 04/23/2016    Past Surgical History:  Procedure Laterality Date  . COLONOSCOPY  03/24/2011   Procedure: COLONOSCOPY;  Surgeon: Robert M Rourk, MD;  [facility];  Service: Endoscopy;  Laterality: N/A;  11:30 AM  . CORONARY ARTERY BYPASS GRAFT N/A 05/10/2016   Procedure: CORONARY ARTERY BYPASS GRAFTING (CABG) x four , using left internal mammary artery and right leg greater saphenous vein harvested endoscopically, and Cryo saphenous vein;  Surgeon: Peter Van Trigt, MD;  Location: [facility];  Service: Open Heart  Surgery;  Laterality: N/A;  . FACIAL FRACTURE SURGERY  1973   [facility]  . LEFT HEART CATH AND CORONARY ANGIOGRAPHY N/A 04/25/2016   Procedure: Left Heart Cath and Coronary Angiography;  Surgeon: Thomas A Kelly, MD;  Location: [facility];  Service: Cardiovascular;  Laterality: N/A;  . MANDIBLE SURGERY  1973   broke jaw and reset  . TEE WITHOUT CARDIOVERSION N/A 05/10/2016   Procedure: TRANSESOPHAGEAL ECHOCARDIOGRAM (TEE);  Surgeon: Peter Van Trigt, MD;  Location: [facility];  Service: Open Heart Surgery;  Laterality: N/A;     Current Outpatient Prescriptions  Medication Sig Dispense Refill  . acetaminophen (TYLENOL) 325 MG tablet Take 2 tablets (650 mg total) by mouth every 4 (four) hours as needed for headache or mild pain.    . aspirin 81 MG chewable tablet Chew 1 tablet (81 mg total) by mouth daily.    . atorvastatin (LIPITOR) 80 MG tablet Take 1 tablet (80 mg total) by mouth daily at 6 PM. 30 tablet 6  . cephALEXin (KEFLEX) 500 MG capsule Take 1 capsule (500 mg total) by mouth 3 (three) times daily. 15 capsule 0  . clopidogrel (PLAVIX) 75 MG tablet Take 1 tablet (75 mg total) by mouth daily. 30 tablet 0  . furosemide (LASIX) 40 MG tablet Take 1 tablet (40 mg total) by mouth daily. FOR 7 DAYS (Patient taking differently: Take 40 mg by mouth daily. ) 7 tablet 0  . isosorbide mononitrate (IMDUR) 60 MG 24 hr tablet Take 60   mg by mouth daily.  6  . lisinopril (PRINIVIL,ZESTRIL) 10 MG tablet Take 1 tablet (10 mg total) by mouth daily. 30 tablet 3  . metoprolol tartrate (LOPRESSOR) 25 MG tablet Take 0.5 tablets (12.5 mg total) by mouth 2 (two) times daily. 30 tablet 6  . nitroGLYCERIN (NITROSTAT) 0.4 MG SL tablet Place 0.4 mg under the tongue every 5 (five) minutes as needed for chest pain.  6  . potassium chloride SA (K-DUR,KLOR-CON) 20 MEQ tablet Take 1 tablet (20 mEq total) by mouth daily. For 7 Days (Patient taking differently: Take 20 mEq by mouth daily. ) 7 tablet 0  . traMADol (ULTRAM) 50 MG  tablet Take 1-2 tablets (50-100 mg total) by mouth every 4 (four) hours as needed for moderate pain. 40 tablet 0   No current facility-administered medications for this visit.     No flowsheet data found.    Allergies:   Codeine and Morphine and related    Social History:  The patient  reports that he has never smoked. He has never used smokeless tobacco. He reports that he does not drink alcohol or use drugs.   Family History:  The patient's family history includes Heart disease in his mother.    ROS:  Please see the history of present illness.    Review of Systems: Constitutional:  denies fever, chills, diaphoresis, appetite change and fatigue.  HEENT: denies photophobia, eye pain, redness, hearing loss, ear pain, congestion, sore throat, rhinorrhea, sneezing, neck pain, neck stiffness and tinnitus.  Respiratory: denies SOB, DOE, cough, chest tightness, and wheezing.  Cardiovascular: admits to chest pain, palpitations and leg swelling.  Gastrointestinal: denies nausea, vomiting, abdominal pain, diarrhea, constipation, blood in stool.  Genitourinary: denies dysuria, urgency, frequency, hematuria, flank pain and difficulty urinating.  Musculoskeletal: denies  myalgias, back pain, joint swelling, arthralgias and gait problem.   Skin: denies pallor, rash and wound.  Neurological: denies dizziness, seizures, syncope, weakness, light-headedness, numbness and headaches.   Hematological: denies adenopathy, easy bruising, personal or family bleeding history.  Psychiatric/ Behavioral: denies suicidal ideation, mood changes, confusion, nervousness, sleep disturbance and agitation.       All other systems are reviewed and negative.    PHYSICAL EXAM: VS:  BP 104/70   Pulse 91   Ht 5' 8" (1.727 m)   SpO2 94%  , BMI There is no height or weight on file to calculate BMI. GEN: obese, elderly male, HAD  HEENT: normal  Neck: no JVD, carotid bruits, or masses Cardiac: RRR; no murmurs,  rubs, or gallops,no edema ,  the thoracostomy sites are still open slightly. His sternotomy is well-healed. Respiratory:  clear to auscultation bilaterally, normal work of breathing GI: soft, nontender, nondistended, + BS MS: no deformity or atrophy  Skin: warm and dry, no rash Neuro:  Strength and sensation are intact Psych: normal   EKG:  EKG is ordered today. The ekg ordered today demonstrates N NSR at 91.   Ant. Lat TWI - c/w a NSTEMI    Recent Labs: 05/11/2016: Magnesium 1.8 05/28/2016: Hemoglobin 12.2; Platelets 351 05/29/2016: ALT 22; BUN 16; Creatinine, Ser 0.90; Potassium 4.1; Sodium 133    Lipid Panel    Component Value Date/Time   CHOL 100 05/29/2016 0014   TRIG 102 05/29/2016 0014   HDL 32 (L) 05/29/2016 0014   CHOLHDL 3.1 05/29/2016 0014   VLDL 20 05/29/2016 0014   LDLCALC 48 05/29/2016 0014      Wt Readings from Last 3 Encounters:  05/29/16   245 lb (111.1 kg)  05/17/16 253 lb 12.8 oz (115.1 kg)  05/06/16 260 lb 3.2 oz (118 kg)      Other studies Reviewed: Additional studies/ records that were reviewed today include: . Review of the above records demonstrates:    ASSESSMENT AND PLAN:  1.  Unstable angina: Yorel presents for repeat evaluation. He presented to the hospital last weekend with a non-ST segment elevation myocardial infarction. He left the hospital AMA on Monday because he thought that he was To have a repeat bypass surgery. We explained to him that he needs a heart catheterization. We discussed the risks, benefits, and options. He understands and agrees to proceed.  He'll be scheduled with Dr. Kelly on Monday.  2. Hyperlipidemia: His LDL is well controlled. His last LDL level is 48. Continue atorvastatin 80 mg a day.     Current medicines are reviewed at length with the patient today.  The patient does not have concerns regarding medicines.  Labs/ tests ordered today include:  No orders of the defined types were placed in this  encounter.    Disposition:   FU with me in 3 months      Keeleigh Terris, MD  06/03/2016 2:09 PM    [facility] [address], [place], [place]  [address] Phone: [phone]; Fax: [phone]     

## 2016-06-04 LAB — BASIC METABOLIC PANEL
BUN / CREAT RATIO: 26 — AB (ref 10–24)
BUN: 25 mg/dL (ref 8–27)
CALCIUM: 9.4 mg/dL (ref 8.6–10.2)
CHLORIDE: 101 mmol/L (ref 96–106)
CO2: 21 mmol/L (ref 18–29)
CREATININE: 0.96 mg/dL (ref 0.76–1.27)
GFR calc Af Amer: 92 mL/min/{1.73_m2} (ref >59)
GFR calc non Af Amer: 80 mL/min/{1.73_m2} (ref >59)
GLUCOSE: 103 mg/dL — AB (ref 65–99)
Potassium: 4.2 mmol/L (ref 3.5–5.2)
Sodium: 135 mmol/L (ref 134–144)

## 2016-06-04 LAB — PROTIME-INR
INR: 1.1 (ref 0.8–1.2)
Prothrombin Time: 11.7 s (ref 9.1–12.0)

## 2016-06-04 LAB — CBC
HEMOGLOBIN: 12.4 g/dL — AB (ref 13.0–17.7)
Hematocrit: 38.3 % (ref 37.5–51.0)
MCH: 30.8 pg (ref 26.6–33.0)
MCHC: 32.4 g/dL (ref 31.5–35.7)
MCV: 95 fL (ref 79–97)
Platelets: 386 10*3/uL — ABNORMAL HIGH (ref 150–379)
RBC: 4.03 x10E6/uL — AB (ref 4.14–5.80)
RDW: 14 % (ref 12.3–15.4)
WBC: 8 10*3/uL (ref 3.4–10.8)

## 2016-06-06 ENCOUNTER — Other Ambulatory Visit: Payer: Self-pay | Admitting: Cardiothoracic Surgery

## 2016-06-06 ENCOUNTER — Ambulatory Visit (HOSPITAL_COMMUNITY)
Admission: RE | Admit: 2016-06-06 | Discharge: 2016-06-07 | Disposition: A | Discharge status: Home / Self Care | Payer: Medicare Other | Source: Ambulatory Visit | Attending: Cardiovascular Disease | Admitting: Cardiovascular Disease

## 2016-06-06 ENCOUNTER — Other Ambulatory Visit: Payer: Self-pay

## 2016-06-06 ENCOUNTER — Encounter (HOSPITAL_COMMUNITY): Payer: Self-pay | Admitting: General Practice

## 2016-06-06 ENCOUNTER — Encounter (HOSPITAL_COMMUNITY)
Admission: RE | Disposition: A | Discharge status: Home / Self Care | Payer: Self-pay | Source: Ambulatory Visit | Attending: Cardiovascular Disease

## 2016-06-06 DIAGNOSIS — I25710 Atherosclerosis of autologous vein coronary artery bypass graft(s) with unstable angina pectoris: Secondary | ICD-10-CM | POA: Diagnosis not present

## 2016-06-06 DIAGNOSIS — E785 Hyperlipidemia, unspecified: Secondary | ICD-10-CM | POA: Diagnosis not present

## 2016-06-06 DIAGNOSIS — I252 Old myocardial infarction: Secondary | ICD-10-CM | POA: Diagnosis not present

## 2016-06-06 DIAGNOSIS — M1990 Unspecified osteoarthritis, unspecified site: Secondary | ICD-10-CM | POA: Diagnosis not present

## 2016-06-06 DIAGNOSIS — I110 Hypertensive heart disease with heart failure: Secondary | ICD-10-CM | POA: Diagnosis not present

## 2016-06-06 DIAGNOSIS — I2510 Atherosclerotic heart disease of native coronary artery without angina pectoris: Secondary | ICD-10-CM | POA: Diagnosis present

## 2016-06-06 DIAGNOSIS — Z7982 Long term (current) use of aspirin: Secondary | ICD-10-CM | POA: Diagnosis not present

## 2016-06-06 DIAGNOSIS — I25110 Atherosclerotic heart disease of native coronary artery with unstable angina pectoris: Secondary | ICD-10-CM

## 2016-06-06 DIAGNOSIS — J45909 Unspecified asthma, uncomplicated: Secondary | ICD-10-CM | POA: Diagnosis not present

## 2016-06-06 DIAGNOSIS — I25810 Atherosclerosis of coronary artery bypass graft(s) without angina pectoris: Secondary | ICD-10-CM | POA: Diagnosis present

## 2016-06-06 DIAGNOSIS — I200 Unstable angina: Secondary | ICD-10-CM | POA: Diagnosis present

## 2016-06-06 DIAGNOSIS — I2582 Chronic total occlusion of coronary artery: Secondary | ICD-10-CM | POA: Insufficient documentation

## 2016-06-06 DIAGNOSIS — Z951 Presence of aortocoronary bypass graft: Secondary | ICD-10-CM

## 2016-06-06 DIAGNOSIS — Z7902 Long term (current) use of antithrombotics/antiplatelets: Secondary | ICD-10-CM | POA: Insufficient documentation

## 2016-06-06 DIAGNOSIS — I509 Heart failure, unspecified: Secondary | ICD-10-CM | POA: Diagnosis not present

## 2016-06-06 DIAGNOSIS — I10 Essential (primary) hypertension: Secondary | ICD-10-CM | POA: Diagnosis present

## 2016-06-06 HISTORY — PX: LEFT HEART CATH AND CORS/GRAFTS ANGIOGRAPHY: CATH118250

## 2016-06-06 HISTORY — DX: Personal history of urinary calculi: Z87.442

## 2016-06-06 HISTORY — PX: CORONARY ANGIOPLASTY WITH STENT PLACEMENT: SHX49

## 2016-06-06 HISTORY — DX: Unspecified chronic bronchitis: J42

## 2016-06-06 HISTORY — PX: CORONARY STENT INTERVENTION: CATH118234

## 2016-06-06 LAB — POCT ACTIVATED CLOTTING TIME: ACTIVATED CLOTTING TIME: 389 s

## 2016-06-06 SURGERY — LEFT HEART CATH AND CORS/GRAFTS ANGIOGRAPHY
Anesthesia: LOCAL

## 2016-06-06 MED ORDER — IOPAMIDOL (ISOVUE-370) INJECTION 76%
INTRAVENOUS | Status: DC | PRN
  Administered 2016-06-06: 225 mL via INTRA_ARTERIAL

## 2016-06-06 MED ORDER — DIAZEPAM 5 MG PO TABS
5.0000 mg | ORAL_TABLET | Freq: Four times a day (QID) | ORAL | Status: DC | PRN
  Administered 2016-06-07: 5 mg via ORAL
  Filled 2016-06-06: qty 1

## 2016-06-06 MED ORDER — SODIUM CHLORIDE 0.9% FLUSH: 3.0000 mL | Freq: Two times a day (BID) | INTRAVENOUS | Status: DC

## 2016-06-06 MED ORDER — SODIUM CHLORIDE 0.9 % IV SOLN: 250.0000 mL | INTRAVENOUS | Status: DC | PRN

## 2016-06-06 MED ORDER — SODIUM CHLORIDE 0.9 % IV SOLN
INTRAVENOUS | Status: DC | PRN
  Administered 2016-06-06 (×2): 1.75 mg/kg/h via INTRAVENOUS

## 2016-06-06 MED ORDER — HEPARIN (PORCINE) IN NACL 2-0.9 UNIT/ML-% IJ SOLN
INTRAMUSCULAR | Status: AC
  Filled 2016-06-06: qty 1000

## 2016-06-06 MED ORDER — HYDRALAZINE HCL 20 MG/ML IJ SOLN: 5.0000 mg | INTRAMUSCULAR | Status: DC | PRN

## 2016-06-06 MED ORDER — ACETAMINOPHEN 325 MG PO TABS
650.0000 mg | ORAL_TABLET | ORAL | Status: DC | PRN
  Administered 2016-06-07: 650 mg via ORAL
  Filled 2016-06-06: qty 2

## 2016-06-06 MED ORDER — SODIUM CHLORIDE 0.9 % WEIGHT BASED INFUSION: 1.0000 mL/kg/h | INTRAVENOUS | Status: DC

## 2016-06-06 MED ORDER — ASPIRIN 81 MG PO CHEW: 81.0000 mg | CHEWABLE_TABLET | ORAL | Status: DC

## 2016-06-06 MED ORDER — ANGIOPLASTY BOOK
Freq: Once | Status: AC
  Administered 2016-06-07: 01:00:00
  Filled 2016-06-06: qty 1

## 2016-06-06 MED ORDER — MIDAZOLAM HCL 2 MG/2ML IJ SOLN
INTRAMUSCULAR | Status: DC | PRN
  Administered 2016-06-06: 1 mg via INTRAVENOUS
  Administered 2016-06-06: 2 mg via INTRAVENOUS

## 2016-06-06 MED ORDER — TICAGRELOR 90 MG PO TABS
ORAL_TABLET | ORAL | Status: AC
  Filled 2016-06-06: qty 1

## 2016-06-06 MED ORDER — ONDANSETRON HCL 4 MG/2ML IJ SOLN: 4.0000 mg | Freq: Four times a day (QID) | INTRAMUSCULAR | Status: DC | PRN

## 2016-06-06 MED ORDER — TICAGRELOR 90 MG PO TABS
90.0000 mg | ORAL_TABLET | Freq: Two times a day (BID) | ORAL | Status: DC
  Administered 2016-06-07 (×2): 90 mg via ORAL
  Filled 2016-06-06 (×2): qty 1

## 2016-06-06 MED ORDER — ENSURE ENLIVE PO LIQD
237.0000 mL | Freq: Two times a day (BID) | ORAL | Status: DC
  Filled 2016-06-06 (×4): qty 237

## 2016-06-06 MED ORDER — BIVALIRUDIN BOLUS VIA INFUSION - CUPID
INTRAVENOUS | Status: DC | PRN
  Administered 2016-06-06: 85.725 mg via INTRAVENOUS

## 2016-06-06 MED ORDER — FENTANYL CITRATE (PF) 100 MCG/2ML IJ SOLN
INTRAMUSCULAR | Status: AC
  Filled 2016-06-06: qty 2

## 2016-06-06 MED ORDER — SODIUM CHLORIDE 0.9% FLUSH: 3.0000 mL | INTRAVENOUS | Status: DC | PRN

## 2016-06-06 MED ORDER — MIDAZOLAM HCL 2 MG/2ML IJ SOLN
INTRAMUSCULAR | Status: AC
  Filled 2016-06-06: qty 2

## 2016-06-06 MED ORDER — ASPIRIN 81 MG PO CHEW
81.0000 mg | CHEWABLE_TABLET | Freq: Every day | ORAL | Status: DC
  Administered 2016-06-07: 81 mg via ORAL
  Filled 2016-06-06: qty 1

## 2016-06-06 MED ORDER — CLOPIDOGREL BISULFATE 75 MG PO TABS: 75.0000 mg | ORAL_TABLET | Freq: Once | ORAL | Status: DC

## 2016-06-06 MED ORDER — LIDOCAINE HCL (PF) 1 % IJ SOLN
INTRAMUSCULAR | Status: AC
  Filled 2016-06-06: qty 30

## 2016-06-06 MED ORDER — ATORVASTATIN CALCIUM 80 MG PO TABS
80.0000 mg | ORAL_TABLET | Freq: Every day | ORAL | Status: DC
  Administered 2016-06-06: 80 mg via ORAL
  Filled 2016-06-06: qty 1

## 2016-06-06 MED ORDER — IOPAMIDOL (ISOVUE-370) INJECTION 76%
INTRAVENOUS | Status: AC
Start: 2016-06-06 — End: 2016-06-06
  Filled 2016-06-06: qty 100

## 2016-06-06 MED ORDER — NITROGLYCERIN 1 MG/10 ML FOR IR/CATH LAB
INTRA_ARTERIAL | Status: DC | PRN
  Administered 2016-06-06: 200 ug via INTRACORONARY
  Administered 2016-06-06: 100 ug via INTRACORONARY
  Administered 2016-06-06 (×2): 200 ug via INTRACORONARY

## 2016-06-06 MED ORDER — CLOPIDOGREL BISULFATE 75 MG PO TABS
ORAL_TABLET | ORAL | Status: AC
  Filled 2016-06-06: qty 1

## 2016-06-06 MED ORDER — SODIUM CHLORIDE 0.9 % IV SOLN
INTRAVENOUS | Status: DC
  Administered 2016-06-06: 16:00:00 via INTRAVENOUS

## 2016-06-06 MED ORDER — SODIUM CHLORIDE 0.9% FLUSH
3.0000 mL | Freq: Two times a day (BID) | INTRAVENOUS | Status: DC
  Administered 2016-06-07 (×2): 3 mL via INTRAVENOUS

## 2016-06-06 MED ORDER — TICAGRELOR 90 MG PO TABS
ORAL_TABLET | ORAL | Status: DC | PRN
  Administered 2016-06-06: 180 mg via ORAL

## 2016-06-06 MED ORDER — HEPARIN (PORCINE) IN NACL 2-0.9 UNIT/ML-% IJ SOLN
INTRAMUSCULAR | Status: DC | PRN
  Administered 2016-06-06: 1000 mL

## 2016-06-06 MED ORDER — SODIUM CHLORIDE 0.9 % WEIGHT BASED INFUSION
3.0000 mL/kg/h | INTRAVENOUS | Status: DC
  Administered 2016-06-06: 3 mL/kg/h via INTRAVENOUS

## 2016-06-06 MED ORDER — BIVALIRUDIN 250 MG IV SOLR
INTRAVENOUS | Status: AC
  Filled 2016-06-06: qty 250

## 2016-06-06 MED ORDER — FENTANYL CITRATE (PF) 100 MCG/2ML IJ SOLN
INTRAMUSCULAR | Status: DC | PRN
  Administered 2016-06-06: 50 ug via INTRAVENOUS
  Administered 2016-06-06 (×2): 25 ug via INTRAVENOUS

## 2016-06-06 MED ORDER — LABETALOL HCL 5 MG/ML IV SOLN: 10.0000 mg | INTRAVENOUS | Status: DC | PRN

## 2016-06-06 MED ORDER — BIVALIRUDIN 250 MG IV SOLR
INTRAVENOUS | Status: AC
Start: 2016-06-06 — End: 2016-06-06
  Filled 2016-06-06: qty 250

## 2016-06-06 MED ORDER — LIDOCAINE HCL (PF) 1 % IJ SOLN
INTRAMUSCULAR | Status: AC
Start: 2016-06-06 — End: 2016-06-06
  Filled 2016-06-06: qty 30

## 2016-06-06 SURGICAL SUPPLY — 21 items
BALLN ANGIOSCULPT RX 2.5X10 (BALLOONS) ×2
BALLN MOZEC 2.0X12 (BALLOONS) ×2
BALLN ~~LOC~~ EMERGE MR 3.0X8 (BALLOONS) ×2
BALLOON ANGIOSCULPT RX 2.5X10 (BALLOONS) ×1 IMPLANT
BALLOON MOZEC 2.0X12 (BALLOONS) ×1 IMPLANT
BALLOON ~~LOC~~ EMERGE MR 3.0X8 (BALLOONS) ×1 IMPLANT
CATH INFINITI 5 FR LCB (CATHETERS) ×2 IMPLANT
CATH INFINITI 5FR MULTPACK ANG (CATHETERS) ×2 IMPLANT
CATH VISTA GUIDE 6FR XBLAD3.5 (CATHETERS) ×2 IMPLANT
KIT ENCORE 26 ADVANTAGE (KITS) ×2 IMPLANT
KIT HEART LEFT (KITS) ×2 IMPLANT
PACK CARDIAC CATHETERIZATION (CUSTOM PROCEDURE TRAY) ×2 IMPLANT
SHEATH PINNACLE 5F 10CM (SHEATH) ×2 IMPLANT
SHEATH PINNACLE 6F 10CM (SHEATH) ×2 IMPLANT
STENT RESOLUTE ONYX 2.75X12 (Permanent Stent) ×2 IMPLANT
STENT RESOLUTE ONYX 2.75X15 (Permanent Stent) ×2 IMPLANT
SYR MEDRAD MARK V 150ML (SYRINGE) ×2 IMPLANT
TRANSDUCER W/STOPCOCK (MISCELLANEOUS) ×2 IMPLANT
TUBING CIL FLEX 10 FLL-RA (TUBING) ×2 IMPLANT
WIRE COUGAR XT STRL 190CM (WIRE) ×2 IMPLANT
WIRE EMERALD 3MM-J .035X150CM (WIRE) ×2 IMPLANT

## 2016-06-06 NOTE — H&P (View-Only) (Signed)
Cardiology Office Note   Date:  06/03/2016   ID:  Albert, Carter 09/22/1945, MRN [id number]  PCP:  Albert Kilts, MD  Cardiologist:   Mertie Moores, MD   Chief Complaint  Patient presents with  . Coronary Artery Disease   Problem list 1. Coronary artery disease- status post coronary artery bypass grafting 2. Hyperlipidemia 3.   History of Present Illness: Albert Carter is a 71 y.o. male who presents for follow-up for recent hospitalization for non-ST segment elevation myocardial infarction.  I met Mr. Kirkwood in the hospital on March 17. He presented with a history of coronary artery disease status post recent coronary artery bypass grafting. He presented with episodes of chest pain that were very similar to his presenting angina. He ruled in for myocardial infarction. The next morning he apparently panicked and did not want to have a heart catheterization. He thought that he was going straight to redo bypass grafting. He left the hospital AMA.  He is still having a dull ache - comes and goes.  Not taking an NTG    Past Medical History:  Diagnosis Date  . Arthritis    " all through my body"  . Asthma   . Bronchitis   . CAD (coronary artery disease), severe with CABG recommended.  04/25/2016  . Hyperlipemia 04/25/2016  . Hypertension   . NSTEMI (non-ST elevated myocardial infarction) ([place]) 04/23/2016  . Ventricular tachycardia, nonsustained ([place]) 04/23/2016    Past Surgical History:  Procedure Laterality Date  . COLONOSCOPY  03/24/2011   Procedure: COLONOSCOPY;  Surgeon: Daneil Dolin, MD;  [facility];  Service: Endoscopy;  Laterality: N/A;  11:30 AM  . CORONARY ARTERY BYPASS GRAFT N/A 05/10/2016   Procedure: CORONARY ARTERY BYPASS GRAFTING (CABG) x four , using left internal mammary artery and right leg greater saphenous vein harvested endoscopically, and Cryo saphenous vein;  Surgeon: Ivin Poot, MD;  Location: [place];  Service: Open Heart  Surgery;  Laterality: N/A;  . FACIAL FRACTURE SURGERY  1973   [facility]  . LEFT HEART CATH AND CORONARY ANGIOGRAPHY N/A 04/25/2016   Procedure: Left Heart Cath and Coronary Angiography;  Surgeon: Troy Sine, MD;  Location: [facility];  Service: Cardiovascular;  Laterality: N/A;  . [place]   broke jaw and reset  . TEE WITHOUT CARDIOVERSION N/A 05/10/2016   Procedure: TRANSESOPHAGEAL ECHOCARDIOGRAM (TEE);  Surgeon: Ivin Poot, MD;  Location: [place];  Service: Open Heart Surgery;  Laterality: N/A;     Current Outpatient Prescriptions  Medication Sig Dispense Refill  . acetaminophen (TYLENOL) 325 MG tablet Take 2 tablets (650 mg total) by mouth every 4 (four) hours as needed for headache or mild pain.    Marland Kitchen aspirin 81 MG chewable tablet Chew 1 tablet (81 mg total) by mouth daily.    Marland Kitchen atorvastatin (LIPITOR) 80 MG tablet Take 1 tablet (80 mg total) by mouth daily at 6 PM. 30 tablet 6  . cephALEXin (KEFLEX) 500 MG capsule Take 1 capsule (500 mg total) by mouth 3 (three) times daily. 15 capsule 0  . clopidogrel (PLAVIX) 75 MG tablet Take 1 tablet (75 mg total) by mouth daily. 30 tablet 0  . furosemide (LASIX) 40 MG tablet Take 1 tablet (40 mg total) by mouth daily. FOR 7 DAYS (Patient taking differently: Take 40 mg by mouth daily. ) 7 tablet 0  . isosorbide mononitrate (IMDUR) 60 MG 24 hr tablet Take 60  mg by mouth daily.  6  . lisinopril (PRINIVIL,ZESTRIL) 10 MG tablet Take 1 tablet (10 mg total) by mouth daily. 30 tablet 3  . metoprolol tartrate (LOPRESSOR) 25 MG tablet Take 0.5 tablets (12.5 mg total) by mouth 2 (two) times daily. 30 tablet 6  . nitroGLYCERIN (NITROSTAT) 0.4 MG SL tablet Place 0.4 mg under the tongue every 5 (five) minutes as needed for chest pain.  6  . potassium chloride SA (K-DUR,KLOR-CON) 20 MEQ tablet Take 1 tablet (20 mEq total) by mouth daily. For 7 Days (Patient taking differently: Take 20 mEq by mouth daily. ) 7 tablet 0  . traMADol (ULTRAM) 50 MG  tablet Take 1-2 tablets (50-100 mg total) by mouth every 4 (four) hours as needed for moderate pain. 40 tablet 0   No current facility-administered medications for this visit.     No flowsheet data found.    Allergies:   Codeine and Morphine and related    Social History:  The patient  reports that he has never smoked. He has never used smokeless tobacco. He reports that he does not drink alcohol or use drugs.   Family History:  The patient's family history includes Heart disease in his mother.    ROS:  Please see the history of present illness.    Review of Systems: Constitutional:  denies fever, chills, diaphoresis, appetite change and fatigue.  HEENT: denies photophobia, eye pain, redness, hearing loss, ear pain, congestion, sore throat, rhinorrhea, sneezing, neck pain, neck stiffness and tinnitus.  Respiratory: denies SOB, DOE, cough, chest tightness, and wheezing.  Cardiovascular: admits to chest pain, palpitations and leg swelling.  Gastrointestinal: denies nausea, vomiting, abdominal pain, diarrhea, constipation, blood in stool.  Genitourinary: denies dysuria, urgency, frequency, hematuria, flank pain and difficulty urinating.  Musculoskeletal: denies  myalgias, back pain, joint swelling, arthralgias and gait problem.   Skin: denies pallor, rash and wound.  Neurological: denies dizziness, seizures, syncope, weakness, light-headedness, numbness and headaches.   Hematological: denies adenopathy, easy bruising, personal or family bleeding history.  Psychiatric/ Behavioral: denies suicidal ideation, mood changes, confusion, nervousness, sleep disturbance and agitation.       All other systems are reviewed and negative.    PHYSICAL EXAM: VS:  BP 104/70   Pulse 91   Ht _0  (1.727 m)   SpO2 94%  , BMI There is no height or weight on file to calculate BMI. GEN: obese, elderly male, HAD  HEENT: normal  Neck: no JVD, carotid bruits, or masses Cardiac: RRR; no murmurs,  rubs, or gallops,no edema ,  the thoracostomy sites are still open slightly. His sternotomy is well-healed. Respiratory:  clear to auscultation bilaterally, normal work of breathing GI: soft, nontender, nondistended, + BS MS: no deformity or atrophy  Skin: warm and dry, no rash Neuro:  Strength and sensation are intact Psych: normal   EKG:  EKG is ordered today. The ekg ordered today demonstrates N NSR at 91.   Ant. Lat TWI - c/w a NSTEMI    Recent Labs: 05/11/2016: Magnesium 1.8 05/28/2016: Hemoglobin 12.2; Platelets 351 05/29/2016: ALT 22; BUN 16; Creatinine, Ser 0.90; Potassium 4.1; Sodium 133    Lipid Panel    Component Value Date/Time   CHOL 100 05/29/2016 0014   TRIG 102 05/29/2016 0014   HDL 32 (L) 05/29/2016 0014   CHOLHDL 3.1 05/29/2016 0014   VLDL 20 05/29/2016 0014   LDLCALC 48 05/29/2016 0014      Wt Readings from Last 3 Encounters:  05/29/16  245 lb (111.1 kg)  05/17/16 253 lb 12.8 oz (115.1 kg)  05/06/16 260 lb 3.2 oz (118 kg)      Other studies Reviewed: Additional studies/ records that were reviewed today include: . Review of the above records demonstrates:    ASSESSMENT AND PLAN:  1.  Unstable angina: Albert Carter presents for repeat evaluation. He presented to the hospital last weekend with a non-ST segment elevation myocardial infarction. He left the hospital AMA on Monday because he thought that he was To have a repeat bypass surgery. We explained to him that he needs a heart catheterization. We discussed the risks, benefits, and options. He understands and agrees to proceed.  He'll be scheduled with Dr. Claiborne Billings on Monday.  2. Hyperlipidemia: His LDL is well controlled. His last LDL level is 48. Continue atorvastatin 80 mg a day.     Current medicines are reviewed at length with the patient today.  The patient does not have concerns regarding medicines.  Labs/ tests ordered today include:  No orders of the defined types were placed in this  encounter.    Disposition:   FU with me in 3 months      Mertie Moores, MD  06/03/2016 2:09 PM    [facility] [address], [place], [place]  [address] Phone: [phone]; Fax: [phone]

## 2016-06-06 NOTE — Interval H&P Note (Signed)
Cath Lab Visit (complete for each Cath Lab visit)  Clinical Evaluation Leading to the Procedure:   ACS: No.  Non-ACS:    Anginal Classification: CCS III  Anti-ischemic medical therapy: Maximal Therapy (2 or more classes of medications)  Non-Invasive Test Results: No non-invasive testing performed  Prior CABG: Previous CABG      History and Physical Interval Note:  06/06/2016 12:55 PM  Albert Carter  has presented today for surgery, with the diagnosis of cad with unstable angina  The various methods of treatment have been discussed with the patient and family. After consideration of risks, benefits and other options for treatment, the patient has consented to  Procedure(s): Left Heart Cath and Cors/Grafts Angiography (N/A) as a surgical intervention .  The patient's history has been reviewed, patient examined, no change in status, stable for surgery.  I have reviewed the patient's chart and labs.  Questions were answered to the patient's satisfaction.     Shelva Majestic

## 2016-06-06 NOTE — Progress Notes (Signed)
Site area: right groin  Site Prior to Removal:  Level 0  Pressure Applied For 20 MINUTES    Minutes Beginning at 1650  Manual:   Yes.    Patient Status During Pull:  stable  Post Pull Groin Site:  Level 0  Post Pull Instructions Given:  Yes.    Post Pull Pulses Present:  Yes.    Dressing Applied:  Yes.    Comments:  Rechecked site frequently during shift with no change in assessment

## 2016-06-06 NOTE — Care Management Note (Addendum)
Case Management Note  Patient Details  Name: CANAAN HOLZER MRN: [id number] Date of Birth: Jun 04, 1945  Subjective/Objective:   s/p coronary intervention, on plavix, per mar future med is brilinta.  NCM awaiting benefit check for brilinta.    NCM gave patient the 30 day free card for brilinta.  He will go to [facility] in [place] to get his 30 day free and they do have it in stock.   Action/Plan:   Expected Discharge Date:                  Expected Discharge Plan:  Home/Self Care  In-House Referral:     Discharge planning Services  CM Consult  Post Acute Care Choice:    Choice offered to:     DME Arranged:    DME Agency:     HH Arranged:    HH Agency:     Status of Service:  In process, will continue to follow  If discussed at Long Length of Stay Meetings, dates discussed:    Additional Comments:  Zenon Mayo, RN 06/06/2016, 4:44 PM

## 2016-06-07 ENCOUNTER — Encounter (HOSPITAL_COMMUNITY): Payer: Self-pay | Admitting: Cardiovascular Disease

## 2016-06-07 ENCOUNTER — Telehealth: Payer: Self-pay | Admitting: Cardiovascular Disease

## 2016-06-07 DIAGNOSIS — I200 Unstable angina: Secondary | ICD-10-CM

## 2016-06-07 DIAGNOSIS — I2510 Atherosclerotic heart disease of native coronary artery without angina pectoris: Secondary | ICD-10-CM | POA: Diagnosis present

## 2016-06-07 DIAGNOSIS — I25710 Atherosclerosis of autologous vein coronary artery bypass graft(s) with unstable angina pectoris: Secondary | ICD-10-CM | POA: Diagnosis not present

## 2016-06-07 DIAGNOSIS — I25810 Atherosclerosis of coronary artery bypass graft(s) without angina pectoris: Secondary | ICD-10-CM | POA: Diagnosis present

## 2016-06-07 DIAGNOSIS — I2582 Chronic total occlusion of coronary artery: Secondary | ICD-10-CM | POA: Diagnosis not present

## 2016-06-07 DIAGNOSIS — I252 Old myocardial infarction: Secondary | ICD-10-CM | POA: Diagnosis not present

## 2016-06-07 DIAGNOSIS — I110 Hypertensive heart disease with heart failure: Secondary | ICD-10-CM | POA: Diagnosis not present

## 2016-06-07 HISTORY — DX: Atherosclerosis of coronary artery bypass graft(s) without angina pectoris: I25.810

## 2016-06-07 LAB — CBC
HCT: 36.9 % — ABNORMAL LOW (ref 39.0–52.0)
HEMATOCRIT: 35.7 % — AB (ref 39.0–52.0)
HEMOGLOBIN: 12.1 g/dL — AB (ref 13.0–17.0)
Hemoglobin: 11.6 g/dL — ABNORMAL LOW (ref 13.0–17.0)
MCH: 30.8 pg (ref 26.0–34.0)
MCH: 30.9 pg (ref 26.0–34.0)
MCHC: 32.5 g/dL (ref 30.0–36.0)
MCHC: 32.8 g/dL (ref 30.0–36.0)
MCV: 94.7 fL (ref 78.0–100.0)
MCV: 94.4 fL (ref 78.0–100.0)
Platelets: 291 10*3/uL (ref 150–400)
Platelets: 305 10*3/uL (ref 150–400)
RBC: 3.77 MIL/uL — ABNORMAL LOW (ref 4.22–5.81)
RBC: 3.91 MIL/uL — ABNORMAL LOW (ref 4.22–5.81)
RDW: 14.9 % (ref 11.5–15.5)
RDW: 14.6 % (ref 11.5–15.5)
WBC: 8.9 10*3/uL (ref 4.0–10.5)
WBC: 9.8 10*3/uL (ref 4.0–10.5)

## 2016-06-07 LAB — BASIC METABOLIC PANEL
Anion gap: 10 (ref 5–15)
BUN: 10 mg/dL (ref 6–20)
CHLORIDE: 103 mmol/L (ref 101–111)
CO2: 23 mmol/L (ref 22–32)
Calcium: 8.8 mg/dL — ABNORMAL LOW (ref 8.9–10.3)
Creatinine, Ser: 0.71 mg/dL (ref 0.61–1.24)
GFR calc Af Amer: >60 mL/min (ref >60)
GFR calc non Af Amer: >60 mL/min (ref >60)
GLUCOSE: 163 mg/dL — AB (ref 65–99)
POTASSIUM: 3.6 mmol/L (ref 3.5–5.1)
Sodium: 136 mmol/L (ref 135–145)

## 2016-06-07 MED ORDER — ATROPINE SULFATE 1 MG/10ML IJ SOSY
PREFILLED_SYRINGE | INTRAMUSCULAR | Status: AC
  Filled 2016-06-07: qty 10

## 2016-06-07 MED ORDER — METOPROLOL TARTRATE 12.5 MG HALF TABLET
12.5000 mg | ORAL_TABLET | Freq: Two times a day (BID) | ORAL | Status: DC
  Administered 2016-06-07: 11:00:00 12.5 mg via ORAL
  Filled 2016-06-07: qty 1

## 2016-06-07 MED ORDER — LISINOPRIL 10 MG PO TABS
10.0000 mg | ORAL_TABLET | Freq: Every day | ORAL | Status: DC
  Administered 2016-06-07: 10 mg via ORAL
  Filled 2016-06-07: qty 1

## 2016-06-07 MED ORDER — ISOSORBIDE MONONITRATE ER 60 MG PO TB24: 60.0000 mg | ORAL_TABLET | Freq: Every day | ORAL | 6 refills | Status: DC

## 2016-06-07 MED ORDER — TICAGRELOR 90 MG PO TABS: 90.0000 mg | ORAL_TABLET | Freq: Two times a day (BID) | ORAL | 4 refills | Status: AC

## 2016-06-07 MED ORDER — TICAGRELOR 90 MG PO TABS: 90.0000 mg | ORAL_TABLET | Freq: Two times a day (BID) | ORAL | 0 refills | Status: DC

## 2016-06-07 MED ORDER — NITROGLYCERIN 0.4 MG SL SUBL: 0.4000 mg | SUBLINGUAL_TABLET | SUBLINGUAL | Status: DC | PRN

## 2016-06-07 MED FILL — Heparin Sodium (Porcine) 2 Unit/ML in Sodium Chloride 0.9%: INTRAMUSCULAR | Qty: 500 | Status: AC

## 2016-06-07 NOTE — Progress Notes (Signed)
Progress Note  Patient Name: Albert Carter Date of Encounter: 06/07/2016  Primary Cardiologist: Dr. Acie Fredrickson    Subjective   "My leg finally stopped bleeding"  No chest pain and no SOB  Inpatient Medications    Scheduled Meds: . aspirin  81 mg Oral Daily  . atorvastatin  80 mg Oral q1800  . clopidogrel  75 mg Oral Once  . feeding supplement (ENSURE ENLIVE)  237 mL Oral BID BM  . sodium chloride flush  3 mL Intravenous Q12H  . ticagrelor  90 mg Oral BID   Continuous Infusions:  PRN Meds: sodium chloride, acetaminophen, diazepam, ondansetron (ZOFRAN) IV, sodium chloride flush   Vital Signs    Vitals:   06/07/16 0230 06/07/16 0245 06/07/16 0300 06/07/16 0401  BP: (!) 115/92 (!) 141/100 124/75 (!) 146/99  Pulse:    96  Resp: 18 15 (!) 22 17  Temp:    97.5 F (36.4 C)  TempSrc:    Axillary  SpO2:    97%  Weight:    250 lb (113.4 kg)  Height:        Intake/Output Summary (Last 24 hours) at 06/07/16 0755 Last data filed at 06/07/16 0001  Gross per 24 hour  Intake              765 ml  Output             1450 ml  Net             -685 ml   Filed Weights   06/06/16 0858 06/07/16 0401  Weight: 252 lb (114.3 kg) 250 lb (113.4 kg)    Telemetry    SR - Personally Reviewed  ECG     SR with PACs and PVC, no acute changes, T wave inversion in lat leads continues. - Personally Reviewed  Physical Exam   GEN: No acute distress.   Neck: No JVD Cardiac: RRR, no murmurs, rubs, or gallops.  Respiratory: Clear to auscultation bilaterally- ant. GI: Soft, nontender, non-distended  MS: No edema; + ecchymosis of Rt groin area soft.  No bruit Neuro:  Nonfocal  Psych: Normal affect   Labs    Chemistry  Recent Labs Lab 06/03/16 1436 06/07/16 0233  NA 135 136  K 4.2 3.6  CL 101 103  CO2 21 23  GLUCOSE 103* 163*  BUN 25 10  CREATININE 0.96 0.71  CALCIUM 9.4 8.8*  GFRNONAA 80 >60  GFRAA 92 >60  ANIONGAP  --  10     Hematology  Recent Labs Lab  06/03/16 1436 06/07/16 0233  WBC 8.0 8.9  RBC 4.03* 3.77*  HGB  --  11.6*  HCT 38.3 35.7*  MCV 95 94.7  MCH 30.8 30.8  MCHC 32.4 32.5  RDW 14.0 14.9  PLT 386* 291    Cardiac EnzymesNo results for input(s): TROPONINI in the last 168 hours. No results for input(s): TROPIPOC in the last 168 hours.   BNPNo results for input(s): BNP, PROBNP in the last 168 hours.   DDimer No results for input(s): DDIMER in the last 168 hours.   Radiology    No results found.  Cardiac Studies   Cardiac cath 06/06/16 Procedures   Coronary Stent Intervention  Left Heart Cath and Cors/Grafts Angiography  Conclusion     Dist LAD lesion, 100 %stenosed.  Mid LAD lesion, 90 %stenosed.  Prox LAD lesion, 80 %stenosed.  Ost LAD lesion, 50 %stenosed.  Ost RPDA lesion, 100 %stenosed.  SVG and  is anatomically normal.  SVG.  Origin lesion, 100 %stenosed.  SVG.  Origin lesion, 100 %stenosed.  A STENT RESOLUTE ONYX [id number] drug eluting stent was successfully placed.  Mid Cx to Dist Cx lesion, 95 %stenosed.  Post intervention, there is a 0% residual stenosis.  A STENT RESOLUTE ONYX [id number] drug eluting stent was successfully placed.  Ost Cx to Prox Cx lesion, 70 %stenosed.  Post intervention, there is a 0% residual stenosis.  LIMA.  Ost LM lesion, 50 %stenosed.  Ost Ramus to Ramus lesion, 70 %stenosed.   Significant native CAD with 50% ostial left main stenosis;   50% proximal in tandem 89% LAD stenoses between the first and second diagonal vessel with total occlusion of the LAD after the second diagonal vessel.  The second diagonal vessel is small caliber and there is an anastomosis from a previous vein graft midsegment of this diagonal 2 vessel;  70% in the very proximal portion of a small intermediate vessel. Left circumflex disease with 75% ostial and proximal stenosis and 95% focal distal stenosis before the distal OM vessel and evidence for a graft in the midportion of  this OM vessel with clot and occlusion.  Dominant native RCA with total occlusion of the PDA at the ostium.  Patent LIMA graft which supplies the mid LAD.  Occluded SVG which had supplied the distal circumflex marginal vessel.  Occluded SVG to which had supplied the second diagonal vessel.  Patent SVG supplying the PDA vessel.  Successful PCI to the native left circumflex vessel with PTCA, Angiosculpt scoring balloon, and ultimate DES stenting with a 2.7515 mm Resolute Onyx DES stent in the distal circumflex and a 95% stenosis being reduced to 0%, and a 2.7512 mm Resolute Onyx DES stent ostially with the 75% stenosis being reduced to 0%.  RECOMMENDATION: DAPT therapy may be needed indefinitely and aggressive medical therapy for concomitant native CAD. Marland Kitchen      Patient Profile     71 y.o. male with hx of CABG 05/10/16 and recent hospitalization 05/28/16 for non-ST segment elevation myocardial infarction.  Pk tropoin was 3.37.  He presented with episodes of chest pain that were very similar to his presenting angina.  The next morning he apparently panicked and did not want to have a heart catheterization. He thought that he was going straight to redo bypass grafting. He left the hospital AMA.  On OV he continued with dull chest ache.  Arranged for elective cardiac cath.   Assessment & Plan    Unstable angina with recent NSTEMI 05/28/16   CAD with hx of CABG 05/10/16 now with 2 VGs down, patent VG to PDA and Patent LIMA.    CAD with PCI to native LCX -PTCA, angio-sculpt and DES- distally.  And ostially with DES only.  This admit On Brilinta and ASA  Previously on plavix.   HLD continue statin  Groin bleed- from cath site.  No bruit - pressure dressing to ambulate.     HTN  Have resumed lisinopril and BB from home.        Signed, Cecilie Kicks, NP  06/07/2016, 7:55 AM     I have seen and examined the patient along with Cecilie Kicks, NP .  I have reviewed the chart, notes  and new data.  I agree with NP's note.  Key new complaints: no angina, no dyspnea lying flat Key examination changes: large infrainguinal ecchymosis, small soft hematoma, no abdominal distention, good pulse without bruit. A little tachycardic. Key new  findings / data: Hgb pending  PLAN: Ambulate. DC if he feels well and Hgb stable. Beta blocker and ACEi restarted, may have a little rebound tachycardia/HTN.  Sanda Klein, MD, [place] [phone] 06/07/2016, 8:47 AM

## 2016-06-07 NOTE — Progress Notes (Signed)
BRILINTA 90 MG BID   COVER- YES  CO-PAY- 25 % OF TOTAL COAST  TIER- 3 DRUG  PRIOR APPROVAL- NO   PHARMACY : [facility] AND [facility]

## 2016-06-07 NOTE — Telephone Encounter (Signed)
TOC Phone call. Appt on 06/16/16 at [place] . Thanks

## 2016-06-07 NOTE — Discharge Instructions (Signed)
Call [facility] at [phone] if any bleeding, swelling or drainage at cath site.  May shower, no tub baths for 48 hours for groin sticks. No lifting over 5 pounds for 3 days.  No Driving for 3 days.  If groin bleeds more or becomes painfull either come to ER or call the office.  If severe call 911.  Take 1 NTG, under your tongue, while sitting.  If no relief of pain may repeat NTG, one tab every 5 minutes up to 3 tablets total over 15 minutes.  If no relief CALL 911.  If you have dizziness/lightheadness  while taking NTG, stop taking and call 911.         Do not miss Brilinta or Asprin stopping may cause a heart attack.    We stopped the plavix.  Brilinta is in place of the plavix.    Continue with plans for cardiac rehab.

## 2016-06-07 NOTE — Progress Notes (Signed)
0120- pt bleeding from femoral site; sitting up in chair. Called RN [place], got pt back to bed. Pressure held, unable to stop bleeding. Called dr Aundra Dubin. femostop applied. See flowsheet for deflation and vitals. Hemostasis was achieved. Bedrest to begin at 0330 x 4 hours. femostop will remain deflated on pt right groin until 0700.  Then patient will remain in bed until examined by rounding cardiology in the morning. Patient verbalizes understanding.

## 2016-06-07 NOTE — Progress Notes (Signed)
Pt and wife given all discharge instructions and verbalized understanding of such.  Pt and wife aware of schedule for all meds. Pt had no chest pain today and rt groin is soft ay level 1 for lg amt of bruising. Pt and wife is aware of when to call doctor and or 911 for issues with rt groin site and or chest pain. Pt discharged with wife via wheelchair to home with all belongings.

## 2016-06-07 NOTE — Progress Notes (Signed)
CARDIAC REHAB PHASE I   PRE:  Rate/Rhythm: 100 ST    BP: sitting 129/83    SaO2: 99 RA  MODE:  Ambulation: 500 ft   POST:  Rate/Rhythm: 121 ST    BP: sitting 126/66     SaO2:   Tolerated well, no c/o. Steady. HR elevated but asx. Ed completed/reviewed. Understanding importance of Brilinta now. Already referred to [facility] and they have been in communication.  [place], ACSM 06/07/2016 10:05 AM

## 2016-06-07 NOTE — Discharge Summary (Signed)
Discharge Summary    Patient ID: Albert Carter,  MRN: [id number], DOB/AGE: [id number] y.o.  Admit date: 06/06/2016 Discharge date: 06/07/2016  Primary Care Provider: Sharilyn Sites Carter Primary Cardiologist: Dr. Acie Carter  Discharge Diagnoses    Principal Problem:   Unstable angina [facility]) Active Problems:   CAD in native artery, hx of CABG on 05/10/16, 06/07/16 with DES to ostial LCX and distal LCX   CAD (coronary artery disease) of artery bypass graft, 2 VGs occluded to 2nd diag and dLCX. patent LIMA and VG to PDA 06/06/16    NSTEMI (non-ST elevated myocardial infarction) ([facility]) recent   Hyperlipemia   Hypertension   Allergies Allergies  Allergen Reactions  . Codeine Other (See Comments)    MAKES HIM SLEEP ALL TIME  . Morphine And Related Nausea And Vomiting    Diagnostic Studies/Procedures    06/07/16 by Dr. Claiborne Carter  Procedures   Coronary Stent Intervention  Left Heart Cath and Cors/Grafts Angiography  Conclusion     Dist LAD lesion, 100 %stenosed.  Mid LAD lesion, 90 %stenosed.  Prox LAD lesion, 80 %stenosed.  Ost LAD lesion, 50 %stenosed.  Ost RPDA lesion, 100 %stenosed.  SVG and is anatomically normal.  SVG.  Origin lesion, 100 %stenosed.  SVG.  Origin lesion, 100 %stenosed.  A STENT RESOLUTE ONYX [id number] drug eluting stent was successfully placed.  Mid Cx to Dist Cx lesion, 95 %stenosed.  Post intervention, there is a 0% residual stenosis.  A STENT RESOLUTE ONYX [id number] drug eluting stent was successfully placed.  Ost Cx to Prox Cx lesion, 70 %stenosed.  Post intervention, there is a 0% residual stenosis.  LIMA.  Ost LM lesion, 50 %stenosed.  Ost Ramus to Ramus lesion, 70 %stenosed.   Significant native CAD with 50% ostial left main stenosis;   50% proximal in tandem 89% LAD stenoses between the first and second diagonal vessel with total occlusion of the LAD after the second diagonal vessel.  The second diagonal vessel is small  caliber and there is an anastomosis from a previous vein graft midsegment of this diagonal 2 vessel;  70% in the very proximal portion of a small intermediate vessel. Left circumflex disease with 75% ostial and proximal stenosis and 95% focal distal stenosis before the distal OM vessel and evidence for a graft in the midportion of this OM vessel with clot and occlusion.  Dominant native RCA with total occlusion of the PDA at the ostium.  Patent LIMA graft which supplies the mid LAD.  Occluded SVG which had supplied the distal circumflex marginal vessel.  Occluded SVG to which had supplied the second diagonal vessel.  Patent SVG supplying the PDA vessel.  Successful PCI to the native left circumflex vessel with PTCA, Angiosculpt scoring balloon, and ultimate DES stenting with a 2.7515 mm Resolute Onyx DES stent in the distal circumflex and a 95% stenosis being reduced to 0%, and a 2.7512 mm Resolute Onyx DES stent ostially with the 75% stenosis being reduced to 0%.  RECOMMENDATION: DAPT therapy may be needed indefinitely and aggressive medical therapy for concomitant native CAD. .     _____________   History of Present Illness     71 y.o. male with hx of CABG and recent hospitalization in Feb 2018 for non-ST segment elevation myocardial infarction.  Cath at that time revealed severe disease.  Post cath he demanded to go home before [facility] could see the pt.  He was afraid of bypass grafting. He left the hospital  AMA.  But with medication.  He saw Dr. Darcey Carter the next week and arranged for CABG on 05/10/16 which he underwent.  CABG x 4 utilizing LIMA to LAD, SVG to OM, SVG to PDA, and Cryo vein to PDA.  D/C'd on 05/17/16.  Readmitted 05/28/16 with NSTEMI and then again became anxious and left AMA before cath.    On follow up OV he continued with dull chest ache.  Arranged for elective cardiac cath.     Hospital Course     Consultants: none   Pt underwent PCI of native LCX on  06/06/16 see above.  That evening he bled from cath site.  CBC at 0230 with some decrease and at 0800 was stable with hgb at 12.  No bruit and all cath site is soft.   Pt was without complaints of chest pain or SOB.  He was seen by Dr. Jerilynn Carter. Albert Carter and found stable for discharge.   Will follow up in 7-10 days TCM appt.  Pt ambulated with cardiac rehab without problems.     _____________  Discharge Vitals Blood pressure 132/86, pulse (!) 106, temperature 97.7 F (36.5 C), temperature source Oral, resp. rate 15, height 5\' 8"  (1.727 m), weight 250 lb (113.4 kg), SpO2 99 %.  Filed Weights   06/06/16 0858 06/07/16 0401  Weight: 252 lb (114.3 kg) 250 lb (113.4 kg)    Labs & Radiologic Studies    CBC  Recent Labs  06/07/16 0233 06/07/16 0859  WBC 8.9 9.8  HGB 11.6* 12.1*  HCT 35.7* 36.9*  MCV 94.7 94.4  PLT 291 970   Basic Metabolic Panel  Recent Labs  06/07/16 0233  NA 136  K 3.6  CL 103  CO2 23  GLUCOSE 163*  BUN 10  CREATININE 0.71  CALCIUM 8.8*   Liver Function Tests No results for input(s): AST, ALT, ALKPHOS, BILITOT, PROT, ALBUMIN in the last 72 hours. No results for input(s): LIPASE, AMYLASE in the last 72 hours. Cardiac Enzymes No results for input(s): CKTOTAL, CKMB, CKMBINDEX, TROPONINI in the last 72 hours. BNP Invalid input(s): POCBNP D-Dimer No results for input(s): DDIMER in the last 72 hours. Hemoglobin A1C No results for input(s): HGBA1C in the last 72 hours. Fasting Lipid Panel No results for input(s): CHOL, HDL, LDLCALC, TRIG, CHOLHDL, LDLDIRECT in the last 72 hours. Thyroid Function Tests No results for input(s): TSH, T4TOTAL, T3FREE, THYROIDAB in the last 72 hours.  Invalid input(s): FREET3 _____________  Dg Chest 2 View  Result Date: 05/28/2016 CLINICAL DATA:  Left chest and back pain and right arm and shoulder pain today. EXAM: CHEST  2 VIEW COMPARISON:  05/13/2016. FINDINGS: Enlarged cardiac silhouette with improvement. Stable post CABG  changes. Decreased linear density at the lung bases. One of the pleural effusions has resolved with a small effusion remaining on one of the sides on the lateral view. Thoracic spine degenerative changes. IMPRESSION: 1. Improving bibasilar atelectasis. 2. Improving cardiomegaly and pleural fluid. Electronically Signed   By: Claudie Revering M.D.   On: 05/28/2016 10:39   Dg Chest 2 View  Result Date: 05/13/2016 CLINICAL DATA:  Status post CABG. EXAM: CHEST  2 VIEW COMPARISON:  05/12/2016 FINDINGS: Sequelae of recent CABG are again identified. Right jugular sheath and left chest tube have been removed. Cardiac silhouette remains enlarged. Mild pulmonary vascular congestion on the prior study has decreased. Mild bibasilar opacities have also mildly improved. No pneumothorax is identified. There are small bilateral pleural effusions. IMPRESSION: 1. Decreased pulmonary vascular  congestion and bibasilar atelectasis. 2. Small pleural effusions. 3. No pneumothorax following left chest tube removal. Electronically Signed   By: Logan Bores M.D.   On: 05/13/2016 08:53   Dg Chest Port 1 View  Result Date: 05/12/2016 CLINICAL DATA:  Status post CABG 2 days ago EXAM: PORTABLE CHEST 1 VIEW COMPARISON:  Portable chest x-ray 8 May 11, 2016. FINDINGS: There has been removal of the Swan-Ganz catheter. The right internal jugular Cordis sheath remains. The cardiac silhouette remains enlarged. The pulmonary vascularity is mildly prominent. There is no pleural effusion or pneumothorax. There is minimal bibasilar subsegmental atelectasis which is stable. The left-sided chest tube is unchanged in appearance. IMPRESSION: Low-grade CHF. No pneumothorax or significant pleural effusion. Persistent bibasilar subsegmental atelectasis. Electronically Signed   By: David  Martinique M.D.   On: 05/12/2016 07:32   Dg Chest Port 1 View  Result Date: 05/11/2016 CLINICAL DATA:  Post CABG day 4, chest tube in place EXAM: PORTABLE CHEST 1 VIEW  COMPARISON:  05/10/2016 FINDINGS: Cardiomegaly again noted. Status post CABG. Right IJ Swan-Ganz catheter with tip in the region of main pulmonary artery bifurcation. Endotracheal tube has been removed. NG tube has been removed. Stable left-sided chest tube position. Mild basilar atelectasis. No infiltrate or pulmonary edema. No pneumothorax. IMPRESSION: Status post CABG. Endotracheal tube and NG tube has been removed. Stable left chest tube position. Right IJ Swan-Ganz catheter in place. Mild basilar atelectasis. No infiltrate or pulmonary edema. No pneumothorax. Electronically Signed   By: Lahoma Crocker M.D.   On: 05/11/2016 07:56   Dg Chest Port 1 View  Result Date: 05/10/2016 CLINICAL DATA:  Status post CABG today. EXAM: PORTABLE CHEST 1 VIEW COMPARISON:  PA and lateral chest 04/29/2016. FINDINGS: Endotracheal tube is in place with the tip in good position at the level of the clavicular heads. Right IJ approach Swan-Ganz catheter tip is in the distal pulmonary outflow tract. NG tube courses into the stomach and below the inferior margin of the film. Left chest tube is noted. Mild bibasilar atelectasis is seen. No pulmonary edema. No pneumothorax. Heart size is upper normal. IMPRESSION: Support tubes and lines project in good position. Negative for pneumothorax. Bibasilar atelectasis. Electronically Signed   By: Inge Rise M.D.   On: 05/10/2016 14:55   Disposition   Pt is being discharged home today in good condition.  Follow-up Plans & Appointments   Call [facility] at [phone] if any bleeding, swelling or drainage at cath site.  May shower, no tub baths for 48 hours for groin sticks. No lifting over 5 pounds for 3 days.  No Driving for 3 days.  If groin bleeds more or becomes painfull either come to ER or call the office.  If severe call 911.  Take 1 NTG, under your tongue, while sitting.  If no relief of pain may repeat NTG, one tab every 5 minutes up to 3  tablets total over 15 minutes.  If no relief CALL 911.  If you have dizziness/lightheadness  while taking NTG, stop taking and call 911.         Do not miss Brilinta or Asprin stopping may cause a heart attack.    We stopped the plavix.  Brilinta is in place of the plavix.   Follow-up Information    Mertie Moores, MD Follow up on 06/16/2016.   Specialty:  Cardiology Why:  at 0900 AM  with his PA Vin. Contact information: [address]  [place] [address] [phone]            Discharge Medications   Current Discharge Medication List    START taking these medications   Details  ticagrelor (BRILINTA) 90 MG TABS tablet Take 1 tablet (90 mg total) by mouth 2 (two) times daily. Qty: 180 tablet, Refills: 4      CONTINUE these medications which have CHANGED   Details  isosorbide mononitrate (IMDUR) 60 MG 24 hr tablet Take 1 tablet (60 mg total) by mouth daily. Refills: 6      CONTINUE these medications which have NOT CHANGED   Details  aspirin 81 MG chewable tablet Chew 1 tablet (81 mg total) by mouth daily.    atorvastatin (LIPITOR) 80 MG tablet Take 1 tablet (80 mg total) by mouth daily at 6 PM. Qty: 30 tablet, Refills: 6    lisinopril (PRINIVIL,ZESTRIL) 10 MG tablet Take 1 tablet (10 mg total) by mouth daily. Qty: 30 tablet, Refills: 3    metoprolol tartrate (LOPRESSOR) 25 MG tablet Take 0.5 tablets (12.5 mg total) by mouth 2 (two) times daily. Qty: 30 tablet, Refills: 6    traMADol (ULTRAM) 50 MG tablet Take 1-2 tablets (50-100 mg total) by mouth every 4 (four) hours as needed for moderate pain. Qty: 40 tablet, Refills: 0    acetaminophen (TYLENOL) 325 MG tablet Take 2 tablets (650 mg total) by mouth every 4 (four) hours as needed for headache or mild pain.    nitroGLYCERIN (NITROSTAT) 0.4 MG SL tablet Place 0.4 mg under the tongue every 5 (five) minutes as needed for chest pain. Refills: 6      STOP taking these medications     cephALEXin  (KEFLEX) 500 MG capsule      clopidogrel (PLAVIX) 75 MG tablet      furosemide (LASIX) 40 MG tablet      potassium chloride SA (K-DUR,KLOR-CON) 20 MEQ tablet          Aspirin prescribed at discharge?  Yes High Intensity Statin Prescribed? (Lipitor 40-80mg  or Crestor 20-40mg ): Yes Beta Blocker Prescribed? Yes For EF <40%, was ACEI/ARB Prescribed? No: NA but is on ACE ADP Receptor Inhibitor Prescribed? (i.e. Plavix etc.-Includes Medically Managed Patients): Yes For EF <40%, Aldosterone Inhibitor Prescribed? No: na Was EF assessed during THIS hospitalization? Yes Was Cardiac Rehab II ordered? (Included Medically managed Patients): Yes   Outstanding Labs/Studies   Recommend CBC and BMP due to leg bleed and resuming ACE with recent cath.    Duration of Discharge Encounter   Greater than 30 minutes including physician time.  Signed, Cecilie Kicks NP 06/07/2016, 11:34 AM

## 2016-06-08 NOTE — Telephone Encounter (Signed)
Patient contacted regarding discharge from [facility] on 06/07/2016.  Patient understands to follow up with provider Robbie Lis, PA-C on 06/16/2016 at 0900 at [facility] office . Patient understands discharge instructions? Yes  Patient understands medications and regiment? Yes  Patient understands to bring all medications to this visit? Yes  Patient voiced understanding and agreed with plan.

## 2016-06-08 NOTE — Telephone Encounter (Signed)
LMTCB

## 2016-06-09 ENCOUNTER — Telehealth: Payer: Self-pay | Admitting: Cardiovascular Disease

## 2016-06-09 NOTE — Telephone Encounter (Signed)
Spoke with patient who states it feels like needles sticking in his right leg. States cath site was right groin on Monday. Complains of some numbness, denies redness, swelling, and states skin is normal temperature. He denies color change in the RLE. He states puncture site has some bruising but was told by Dr. Sallyanne Kuster that this would take some time to heal. I advised him to monitor site for swelling or worsening bruising and if he notes this to call back or go to the ED. He states bruise has not increased in size since he came home from the hospital. He states Dr. Acie Fredrickson told him the right leg would always be more swollen than left due to bypass surgery.I advised him to keep the legs moving and he states he is trying to walk some each day. He states he has an appointment on April 5. I advised him to call back with questions or concerns prior to that appointment. He verbalized understanding and agreement and thanked me for the call.

## 2016-06-09 NOTE — Telephone Encounter (Signed)
Follow Up:   Pt had stents put in Monday. Pt says he is now having a feeling like little needles are sticking in his leg.He wonder if this is coming from the procedure he had?

## 2016-06-10 ENCOUNTER — Encounter: Payer: Medicare Other | Admitting: Cardiology

## 2016-06-10 NOTE — Telephone Encounter (Signed)
Agree with note by Michelle Swinyer, RN  
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
## 2016-06-15 ENCOUNTER — Ambulatory Visit
Admission: RE | Admit: 2016-06-15 | Discharge: 2016-06-15 | Disposition: A | Discharge status: Home / Self Care | Payer: Medicare Other | Source: Ambulatory Visit | Attending: Cardiothoracic Surgery | Admitting: Cardiothoracic Surgery

## 2016-06-15 ENCOUNTER — Ambulatory Visit (INDEPENDENT_AMBULATORY_CARE_PROVIDER_SITE_OTHER): Payer: Self-pay | Admitting: Cardiothoracic Surgery

## 2016-06-15 ENCOUNTER — Encounter: Payer: Self-pay | Admitting: Cardiothoracic Surgery

## 2016-06-15 VITALS — BP 116/73 | HR 78 | Resp 16 | Ht 68.0 in | Wt 245.0 lb

## 2016-06-15 DIAGNOSIS — Z951 Presence of aortocoronary bypass graft: Secondary | ICD-10-CM

## 2016-06-15 DIAGNOSIS — I2510 Atherosclerotic heart disease of native coronary artery without angina pectoris: Secondary | ICD-10-CM
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
## 2016-06-16 ENCOUNTER — Ambulatory Visit: Payer: Medicare Other | Admitting: Physician Assistant

## 2016-06-17 ENCOUNTER — Other Ambulatory Visit: Payer: Self-pay | Admitting: Physician Assistant

## 2016-06-27 ENCOUNTER — Other Ambulatory Visit: Payer: Self-pay | Admitting: Physician Assistant

## 2016-06-29 ENCOUNTER — Encounter (INDEPENDENT_AMBULATORY_CARE_PROVIDER_SITE_OTHER): Payer: Self-pay

## 2016-06-29 ENCOUNTER — Encounter: Payer: Self-pay | Admitting: Physician Assistant

## 2016-06-29 ENCOUNTER — Telehealth: Payer: Self-pay | Admitting: *Deleted

## 2016-06-29 ENCOUNTER — Ambulatory Visit (INDEPENDENT_AMBULATORY_CARE_PROVIDER_SITE_OTHER): Payer: Medicare Other | Admitting: Physician Assistant

## 2016-06-29 VITALS — BP 152/70 | HR 74 | Ht 68.0 in | Wt 250.8 lb

## 2016-06-29 DIAGNOSIS — I739 Peripheral vascular disease, unspecified: Secondary | ICD-10-CM

## 2016-06-29 DIAGNOSIS — I25810 Atherosclerosis of coronary artery bypass graft(s) without angina pectoris: Secondary | ICD-10-CM

## 2016-06-29 DIAGNOSIS — I779 Disorder of arteries and arterioles, unspecified: Secondary | ICD-10-CM

## 2016-06-29 DIAGNOSIS — E785 Hyperlipidemia, unspecified: Secondary | ICD-10-CM | POA: Diagnosis not present

## 2016-06-29 DIAGNOSIS — I252 Old myocardial infarction: Secondary | ICD-10-CM

## 2016-06-29 DIAGNOSIS — I10 Essential (primary) hypertension: Secondary | ICD-10-CM | POA: Diagnosis not present

## 2016-06-29 HISTORY — DX: Disorder of arteries and arterioles, unspecified: I77.9

## 2016-06-29 LAB — CBC WITH DIFFERENTIAL/PLATELET
BASOS: 1 %
Basophils Absolute: 0.1 10*3/uL (ref 0.0–0.2)
EOS (ABSOLUTE): 0.2 10*3/uL (ref 0.0–0.4)
EOS: 3 %
HEMATOCRIT: 40.3 % (ref 37.5–51.0)
HEMOGLOBIN: 13 g/dL (ref 13.0–17.7)
IMMATURE GRANS (ABS): 0 10*3/uL (ref 0.0–0.1)
IMMATURE GRANULOCYTES: 0 %
Lymphocytes Absolute: 1.4 10*3/uL (ref 0.7–3.1)
Lymphs: 28 %
MCH: 29.9 pg (ref 26.6–33.0)
MCHC: 32.3 g/dL (ref 31.5–35.7)
MCV: 93 fL (ref 79–97)
MONOCYTES: 6 %
Monocytes Absolute: 0.3 10*3/uL (ref 0.1–0.9)
Neutrophils Absolute: 3.1 10*3/uL (ref 1.4–7.0)
Neutrophils: 62 %
Platelets: 288 10*3/uL (ref 150–379)
RBC: 4.35 x10E6/uL (ref 4.14–5.80)
RDW: 15.2 % (ref 12.3–15.4)
WBC: 5.1 10*3/uL (ref 3.4–10.8)

## 2016-06-29 LAB — BASIC METABOLIC PANEL
BUN / CREAT RATIO: 13 (ref 10–24)
BUN: 9 mg/dL (ref 8–27)
CHLORIDE: 101 mmol/L (ref 96–106)
CO2: 22 mmol/L (ref 18–29)
Calcium: 9.1 mg/dL (ref 8.6–10.2)
Creatinine, Ser: 0.72 mg/dL — ABNORMAL LOW (ref 0.76–1.27)
GFR calc Af Amer: 108 mL/min/{1.73_m2} (ref >59)
GFR calc non Af Amer: 94 mL/min/{1.73_m2} (ref >59)
Glucose: 126 mg/dL — ABNORMAL HIGH (ref 65–99)
Potassium: 4.3 mmol/L (ref 3.5–5.2)
SODIUM: 140 mmol/L (ref 134–144)

## 2016-06-29 NOTE — Patient Instructions (Addendum)
Medication Instructions:  No changes  Labwork: Today - BMET, CBC  Testing/Procedures: In August 2018 - Carotid ultrasound   Follow-Up: Richardson Dopp, PA-C or Dr. Liam Rogers in 6-8 weeks.  Any Other Special Instructions Will Be Listed Below (If Applicable). If you see that your blood pressure (top number) is 100 or less, call so we can reduce your medications.  If you need a refill on your cardiac medications before your next appointment, please call your pharmacy.
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
## 2016-06-29 NOTE — Telephone Encounter (Signed)
-----   Message from Liliane Shi, [place] sent at 06/29/2016  5:12 PM EDT ----- Please call patient: The kidney function (BUN, Creatinine) and potassium are normal. Hemoglobin normal All other parameters are within acceptable limits and no further intervention or testing required. Continue with current treatment plan. Richardson Dopp, PA-C   06/29/2016 5:12 PM

## 2016-06-29 NOTE — Telephone Encounter (Signed)
DPR ok to s/w pt's wife. Pt's wife has been notified of lab results by phone with verbal understanding.

## 2016-07-13 ENCOUNTER — Other Ambulatory Visit: Payer: Self-pay | Admitting: Cardiovascular Disease

## 2016-07-26 ENCOUNTER — Encounter (HOSPITAL_COMMUNITY): Payer: Self-pay | Admitting: Emergency Medicine

## 2016-07-26 ENCOUNTER — Emergency Department (HOSPITAL_COMMUNITY)
Admission: EM | Admit: 2016-07-26 | Discharge: 2016-07-27 | Disposition: A | Discharge status: Home / Self Care | Payer: Medicare Other | Attending: Emergency Medicine | Admitting: Emergency Medicine

## 2016-07-26 DIAGNOSIS — R040 Epistaxis: Secondary | ICD-10-CM | POA: Diagnosis not present

## 2016-07-26 DIAGNOSIS — Z79899 Other long term (current) drug therapy: Secondary | ICD-10-CM | POA: Diagnosis not present

## 2016-07-26 DIAGNOSIS — R05 Cough: Secondary | ICD-10-CM | POA: Insufficient documentation

## 2016-07-26 DIAGNOSIS — Z7982 Long term (current) use of aspirin: Secondary | ICD-10-CM | POA: Diagnosis not present

## 2016-07-26 DIAGNOSIS — I25810 Atherosclerosis of coronary artery bypass graft(s) without angina pectoris: Secondary | ICD-10-CM | POA: Insufficient documentation

## 2016-07-26 DIAGNOSIS — I252 Old myocardial infarction: Secondary | ICD-10-CM | POA: Insufficient documentation

## 2016-07-26 DIAGNOSIS — J45909 Unspecified asthma, uncomplicated: Secondary | ICD-10-CM | POA: Diagnosis not present

## 2016-07-26 DIAGNOSIS — Z951 Presence of aortocoronary bypass graft: Secondary | ICD-10-CM | POA: Diagnosis not present

## 2016-07-26 DIAGNOSIS — I10 Essential (primary) hypertension: Secondary | ICD-10-CM | POA: Insufficient documentation

## 2016-07-26 MED ORDER — LIDOCAINE-EPINEPHRINE (PF) 1 %-1:200000 IJ SOLN
INTRAMUSCULAR | Status: AC
  Filled 2016-07-26: qty 30

## 2016-07-26 MED ORDER — OXYMETAZOLINE HCL 0.05 % NA SOLN
1.0000 | Freq: Once | NASAL | Status: AC
  Administered 2016-07-26: 1 via NASAL
  Filled 2016-07-26: qty 15
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
## 2016-07-26 NOTE — ED Triage Notes (Signed)
Pt reports nosebleed started at 2200 and has not stopped. Is taking a blood thinner. Hand towel soaked with blood noted. Oozing out of L nare after direct pressure held for approx 15 minutes.

## 2016-07-27 DIAGNOSIS — R040 Epistaxis: Secondary | ICD-10-CM | POA: Diagnosis not present

## 2016-07-27 LAB — CBC WITH DIFFERENTIAL/PLATELET
Basophils Absolute: 0 10*3/uL (ref 0.0–0.1)
Basophils Relative: 0 %
EOS PCT: 0 %
Eosinophils Absolute: 0 10*3/uL (ref 0.0–0.7)
HEMATOCRIT: 42.9 % (ref 39.0–52.0)
Hemoglobin: 13.9 g/dL (ref 13.0–17.0)
LYMPHS ABS: 0.6 10*3/uL — AB (ref 0.7–4.0)
LYMPHS PCT: 7 %
MCH: 30.3 pg (ref 26.0–34.0)
MCHC: 32.4 g/dL (ref 30.0–36.0)
MCV: 93.7 fL (ref 78.0–100.0)
MONO ABS: 0.3 10*3/uL (ref 0.1–1.0)
Monocytes Relative: 3 %
NEUTROS ABS: 8.1 10*3/uL — AB (ref 1.7–7.7)
Neutrophils Relative %: 90 %
PLATELETS: 246 10*3/uL (ref 150–400)
RBC: 4.58 MIL/uL (ref 4.22–5.81)
RDW: 16.1 % — AB (ref 11.5–15.5)
WBC: 8.9 10*3/uL (ref 4.0–10.5)

## 2016-07-28 ENCOUNTER — Encounter (HOSPITAL_COMMUNITY): Payer: Medicare Other

## 2016-07-29 ENCOUNTER — Encounter (HOSPITAL_COMMUNITY): Payer: Self-pay | Admitting: Emergency Medicine

## 2016-07-29 ENCOUNTER — Emergency Department (HOSPITAL_COMMUNITY)
Admission: EM | Admit: 2016-07-29 | Discharge: 2016-07-29 | Disposition: A | Discharge status: Home / Self Care | Payer: Medicare Other | Attending: Emergency Medicine | Admitting: Emergency Medicine

## 2016-07-29 DIAGNOSIS — J45909 Unspecified asthma, uncomplicated: Secondary | ICD-10-CM | POA: Insufficient documentation

## 2016-07-29 DIAGNOSIS — Z4800 Encounter for change or removal of nonsurgical wound dressing: Secondary | ICD-10-CM | POA: Diagnosis not present

## 2016-07-29 DIAGNOSIS — I10 Essential (primary) hypertension: Secondary | ICD-10-CM | POA: Diagnosis not present

## 2016-07-29 DIAGNOSIS — Z79899 Other long term (current) drug therapy: Secondary | ICD-10-CM | POA: Insufficient documentation

## 2016-07-29 DIAGNOSIS — Z7982 Long term (current) use of aspirin: Secondary | ICD-10-CM | POA: Insufficient documentation
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
## 2016-07-29 NOTE — Discharge Instructions (Signed)
If bleeding returns hold steady pressure for 10 minutes at a time. You can spray Afrin in your nose up to 3 times per day for 3 days.  If you were given medicines take as directed.  If you are on coumadin or contraceptives realize their levels and effectiveness is altered by many different medicines.  If you have any reaction (rash, tongues swelling, other) to the medicines stop taking and see a physician.    If your blood pressure was elevated in the ER make sure you follow up for management with a primary doctor or return for chest pain, shortness of breath or stroke symptoms.  Please follow up as directed and return to the ER or see a physician for new or worsening symptoms.  Thank you. Vitals:   07/29/16 0932  BP: (!) 154/79  Pulse: 72  Resp: (!) 22  Temp: 97.6 F (36.4 C)  TempSrc: Oral  SpO2: 98%  Weight: 250 lb (113.4 kg)  Height: 5\' 8"  (1.727 m)

## 2016-07-29 NOTE — ED Triage Notes (Signed)
Patient states he was here for nose bleed on Tuesday night and had packing placed in his nose. States he was told to come back today to have packing removed. No bleeding noted at this time.

## 2016-08-29 ENCOUNTER — Ambulatory Visit (INDEPENDENT_AMBULATORY_CARE_PROVIDER_SITE_OTHER): Payer: Medicare Other | Admitting: Physician Assistant

## 2016-08-29 ENCOUNTER — Encounter: Payer: Self-pay | Admitting: Physician Assistant

## 2016-08-29 VITALS — BP 120/64 | HR 62 | Ht 68.0 in | Wt 239.4 lb

## 2016-08-29 DIAGNOSIS — I2510 Atherosclerotic heart disease of native coronary artery without angina pectoris: Secondary | ICD-10-CM

## 2016-08-29 DIAGNOSIS — I739 Peripheral vascular disease, unspecified: Secondary | ICD-10-CM

## 2016-08-29 DIAGNOSIS — E785 Hyperlipidemia, unspecified: Secondary | ICD-10-CM | POA: Diagnosis not present

## 2016-08-29 DIAGNOSIS — I779 Disorder of arteries and arterioles, unspecified: Secondary | ICD-10-CM | POA: Diagnosis not present

## 2016-08-29 DIAGNOSIS — I10 Essential (primary) hypertension: Secondary | ICD-10-CM

## 2016-08-29 NOTE — Patient Instructions (Signed)
Medication Instructions:  1. Your physician recommends that you continue on your current medications as directed. Please refer to the Current Medication list given to you today.   Labwork: NONE ORDERED   Testing/Procedures: NONE ORDERED  Follow-Up: Your physician wants you to follow-up in: [place] DR. Acie Fredrickson You will receive a reminder letter in the mail two months in advance. If you don't receive a letter, please call our office to schedule the follow-up appointment.   Any Other Special Instructions Will Be Listed Below (If Applicable).     If you need a refill on your cardiac medications before your next appointment, please call your pharmacy.
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
## 2016-09-30 ENCOUNTER — Other Ambulatory Visit: Payer: Self-pay

## 2016-09-30 MED ORDER — ISOSORBIDE MONONITRATE ER 60 MG PO TB24: 60.0000 mg | ORAL_TABLET | Freq: Every day | ORAL | 2 refills | Status: DC

## 2016-09-30 MED ORDER — METOPROLOL TARTRATE 25 MG PO TABS: 12.5000 mg | ORAL_TABLET | Freq: Two times a day (BID) | ORAL | 2 refills | Status: DC

## 2016-09-30 MED ORDER — ATORVASTATIN CALCIUM 80 MG PO TABS: 80.0000 mg | ORAL_TABLET | Freq: Every day | ORAL | 2 refills | Status: DC

## 2016-10-19 ENCOUNTER — Other Ambulatory Visit: Payer: Self-pay | Admitting: Cardiovascular Disease

## 2016-10-24 ENCOUNTER — Ambulatory Visit (HOSPITAL_COMMUNITY)
Admission: RE | Admit: 2016-10-24 | Payer: Medicare Other | Source: Ambulatory Visit | Attending: Physician Assistant | Admitting: Physician Assistant

## 2016-10-24 ENCOUNTER — Other Ambulatory Visit: Payer: Self-pay | Admitting: Cardiovascular Disease

## 2016-10-30 ENCOUNTER — Other Ambulatory Visit: Payer: Self-pay | Admitting: Cardiovascular Disease

## 2016-10-31 ENCOUNTER — Other Ambulatory Visit: Payer: Self-pay | Admitting: Cardiovascular Disease

## 2016-11-21 ENCOUNTER — Encounter (HOSPITAL_COMMUNITY): Payer: Self-pay

## 2016-11-21 ENCOUNTER — Emergency Department (HOSPITAL_COMMUNITY)
Admission: EM | Admit: 2016-11-21 | Discharge: 2016-11-21 | Disposition: A | Discharge status: Home / Self Care | Payer: Medicare Other | Attending: Emergency Medicine | Admitting: Emergency Medicine

## 2016-11-21 ENCOUNTER — Emergency Department (HOSPITAL_COMMUNITY): Payer: Medicare Other

## 2016-11-21 DIAGNOSIS — Z951 Presence of aortocoronary bypass graft: Secondary | ICD-10-CM | POA: Insufficient documentation

## 2016-11-21 DIAGNOSIS — I110 Hypertensive heart disease with heart failure: Secondary | ICD-10-CM | POA: Diagnosis not present

## 2016-11-21 DIAGNOSIS — I252 Old myocardial infarction: Secondary | ICD-10-CM | POA: Insufficient documentation

## 2016-11-21 DIAGNOSIS — I509 Heart failure, unspecified: Secondary | ICD-10-CM | POA: Insufficient documentation

## 2016-11-21 DIAGNOSIS — I2510 Atherosclerotic heart disease of native coronary artery without angina pectoris: Secondary | ICD-10-CM | POA: Diagnosis not present

## 2016-11-21 DIAGNOSIS — Z79899 Other long term (current) drug therapy: Secondary | ICD-10-CM | POA: Diagnosis not present

## 2016-11-21 DIAGNOSIS — Z7982 Long term (current) use of aspirin: Secondary | ICD-10-CM | POA: Insufficient documentation

## 2016-11-21 DIAGNOSIS — F17210 Nicotine dependence, cigarettes, uncomplicated: Secondary | ICD-10-CM | POA: Diagnosis not present

## 2016-11-21 DIAGNOSIS — R0602 Shortness of breath: Secondary | ICD-10-CM

## 2016-11-21 LAB — BASIC METABOLIC PANEL
Anion gap: 8 (ref 5–15)
BUN: 19 mg/dL (ref 6–20)
CHLORIDE: 106 mmol/L (ref 101–111)
CO2: 24 mmol/L (ref 22–32)
Calcium: 9 mg/dL (ref 8.9–10.3)
Creatinine, Ser: 0.79 mg/dL (ref 0.61–1.24)
GFR calc non Af Amer: >60 mL/min (ref >60)
GLUCOSE: 134 mg/dL — AB (ref 65–99)
POTASSIUM: 3.8 mmol/L (ref 3.5–5.1)
SODIUM: 138 mmol/L (ref 135–145)

## 2016-11-21 LAB — CBC WITH DIFFERENTIAL/PLATELET
BASOS ABS: 0 10*3/uL (ref 0.0–0.1)
BASOS PCT: 1 %
Eosinophils Absolute: 0.3 10*3/uL (ref 0.0–0.7)
Eosinophils Relative: 4 %
HEMATOCRIT: 40.9 % (ref 39.0–52.0)
HEMOGLOBIN: 13.6 g/dL (ref 13.0–17.0)
Lymphocytes Relative: 21 %
Lymphs Abs: 1.5 10*3/uL (ref 0.7–4.0)
MCH: 31.3 pg (ref 26.0–34.0)
MCHC: 33.3 g/dL (ref 30.0–36.0)
MCV: 94 fL (ref 78.0–100.0)
MONOS PCT: 5 %
Monocytes Absolute: 0.4 10*3/uL (ref 0.1–1.0)
NEUTROS ABS: 5.1 10*3/uL (ref 1.7–7.7)
NEUTROS PCT: 69 %
Platelets: 210 10*3/uL (ref 150–400)
RBC: 4.35 MIL/uL (ref 4.22–5.81)
RDW: 16.4 % — ABNORMAL HIGH (ref 11.5–15.5)
WBC: 7.3 10*3/uL (ref 4.0–10.5)

## 2016-11-21 LAB — I-STAT TROPONIN, ED: TROPONIN I, POC: 0.02 ng/mL (ref 0.00–0.08)

## 2016-11-21 LAB — BRAIN NATRIURETIC PEPTIDE: B NATRIURETIC PEPTIDE 5: 205 pg/mL — AB (ref 0.0–100.0)

## 2016-11-21 MED ORDER — FUROSEMIDE 10 MG/ML IJ SOLN
40.0000 mg | Freq: Once | INTRAMUSCULAR | Status: AC
  Administered 2016-11-21: 40 mg via INTRAVENOUS
  Filled 2016-11-21: qty 4

## 2016-11-21 MED ORDER — ALBUTEROL SULFATE (2.5 MG/3ML) 0.083% IN NEBU
2.5000 mg | INHALATION_SOLUTION | Freq: Once | RESPIRATORY_TRACT | Status: AC
  Administered 2016-11-21: 2.5 mg via RESPIRATORY_TRACT
  Filled 2016-11-21: qty 3

## 2016-11-21 MED ORDER — IPRATROPIUM-ALBUTEROL 0.5-2.5 (3) MG/3ML IN SOLN
3.0000 mL | Freq: Once | RESPIRATORY_TRACT | Status: AC
  Administered 2016-11-21: 3 mL via RESPIRATORY_TRACT
  Filled 2016-11-21: qty 3

## 2016-11-21 MED ORDER — FUROSEMIDE 20 MG PO TABS: 20.0000 mg | ORAL_TABLET | Freq: Every day | ORAL | 0 refills | Status: DC

## 2016-11-21 NOTE — ED Notes (Signed)
Patient ambulated to the restroom. Pt states he became light headed when walking from the restroom. Vitals in normal limits. Notified RN.

## 2016-11-21 NOTE — ED Triage Notes (Signed)
Pt reports sob for the past 3 or 4 days.  Denies chest pain or weakness.  Reports pain in legs and hips for months.

## 2016-11-21 NOTE — Discharge Instructions (Signed)
Take the prescription as directed.  Call the Cardiologist today to schedule a follow up appointment within the next 3 days.  Return to the Emergency Department immediately sooner if worsening.
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
## 2016-11-22 ENCOUNTER — Ambulatory Visit (INDEPENDENT_AMBULATORY_CARE_PROVIDER_SITE_OTHER): Payer: Medicare Other | Admitting: Cardiovascular Disease

## 2016-11-22 ENCOUNTER — Encounter (INDEPENDENT_AMBULATORY_CARE_PROVIDER_SITE_OTHER): Payer: Self-pay

## 2016-11-22 ENCOUNTER — Encounter: Payer: Self-pay | Admitting: Cardiovascular Disease

## 2016-11-22 VITALS — BP 146/68 | HR 75 | Ht 68.0 in | Wt 246.0 lb

## 2016-11-22 DIAGNOSIS — I2510 Atherosclerotic heart disease of native coronary artery without angina pectoris: Secondary | ICD-10-CM | POA: Diagnosis not present

## 2016-11-22 DIAGNOSIS — I5033 Acute on chronic diastolic (congestive) heart failure: Secondary | ICD-10-CM

## 2016-11-22 DIAGNOSIS — E782 Mixed hyperlipidemia: Secondary | ICD-10-CM

## 2016-11-22 DIAGNOSIS — I739 Peripheral vascular disease, unspecified: Secondary | ICD-10-CM | POA: Diagnosis not present

## 2016-11-22 MED ORDER — FUROSEMIDE 20 MG PO TABS: 20.0000 mg | ORAL_TABLET | Freq: Every day | ORAL | 3 refills | Status: DC

## 2016-11-22 MED ORDER — POTASSIUM CHLORIDE CRYS ER 20 MEQ PO TBCR: 20.0000 meq | EXTENDED_RELEASE_TABLET | Freq: Every day | ORAL | 3 refills | Status: AC

## 2016-11-22 NOTE — Patient Instructions (Signed)
Medication Instructions:  START Kdur (potassium supplement) 20 meq once daily   Labwork: Your physician recommends that you return for lab work in: 3 weeks for basic metabolic panel, liver panel, cholesterol You will need to FAST for this appointment - nothing to eat or drink after midnight the night before except water.   Testing/Procedures: None Ordered   Follow-Up: Your physician recommends that you schedule a follow-up appointment in: 3 months with Dr. Acie Fredrickson   If you need a refill on your cardiac medications before your next appointment, please call your pharmacy.   Thank you for choosing [facility]! Christen Bame, RN [phone]
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
## 2016-12-16 ENCOUNTER — Other Ambulatory Visit: Payer: Medicare Other

## 2016-12-16 DIAGNOSIS — I739 Peripheral vascular disease, unspecified: Secondary | ICD-10-CM

## 2016-12-16 DIAGNOSIS — I2510 Atherosclerotic heart disease of native coronary artery without angina pectoris: Secondary | ICD-10-CM

## 2016-12-16 DIAGNOSIS — E782 Mixed hyperlipidemia: Secondary | ICD-10-CM

## 2016-12-16 LAB — HEPATIC FUNCTION PANEL
ALBUMIN: 3.8 g/dL (ref 3.5–4.8)
ALK PHOS: 70 IU/L (ref 39–117)
ALT: 14 IU/L (ref 0–44)
AST: 17 IU/L (ref 0–40)
BILIRUBIN TOTAL: 0.5 mg/dL (ref 0.0–1.2)
BILIRUBIN, DIRECT: 0.13 mg/dL (ref 0.00–0.40)
Total Protein: 6.6 g/dL (ref 6.0–8.5)

## 2016-12-16 LAB — BASIC METABOLIC PANEL
BUN / CREAT RATIO: 21 (ref 10–24)
BUN: 20 mg/dL (ref 8–27)
CO2: 20 mmol/L (ref 20–29)
Calcium: 9.2 mg/dL (ref 8.6–10.2)
Chloride: 103 mmol/L (ref 96–106)
Creatinine, Ser: 0.94 mg/dL (ref 0.76–1.27)
GFR calc Af Amer: 94 mL/min/{1.73_m2} (ref >59)
GFR calc non Af Amer: 81 mL/min/{1.73_m2} (ref >59)
GLUCOSE: 133 mg/dL — AB (ref 65–99)
POTASSIUM: 3.9 mmol/L (ref 3.5–5.2)
SODIUM: 138 mmol/L (ref 134–144)

## 2016-12-16 LAB — LIPID PANEL
CHOLESTEROL TOTAL: 116 mg/dL (ref 100–199)
Chol/HDL Ratio: 2.8 ratio (ref 0.0–5.0)
HDL: 41 mg/dL (ref >39)
LDL CALC: 57 mg/dL (ref 0–99)
Triglycerides: 88 mg/dL (ref 0–149)
VLDL CHOLESTEROL CAL: 18 mg/dL (ref 5–40)

## 2017-01-29 ENCOUNTER — Encounter (HOSPITAL_COMMUNITY): Payer: Self-pay | Admitting: Emergency Medicine

## 2017-01-29 ENCOUNTER — Other Ambulatory Visit: Payer: Self-pay

## 2017-01-29 ENCOUNTER — Emergency Department (HOSPITAL_COMMUNITY)
Admission: EM | Admit: 2017-01-29 | Discharge: 2017-01-29 | Disposition: A | Discharge status: Home / Self Care | Payer: Medicare Other | Attending: Emergency Medicine | Admitting: Emergency Medicine

## 2017-01-29 ENCOUNTER — Emergency Department (HOSPITAL_COMMUNITY): Payer: Medicare Other

## 2017-01-29 DIAGNOSIS — Z79899 Other long term (current) drug therapy: Secondary | ICD-10-CM | POA: Insufficient documentation

## 2017-01-29 DIAGNOSIS — I110 Hypertensive heart disease with heart failure: Secondary | ICD-10-CM | POA: Diagnosis not present

## 2017-01-29 DIAGNOSIS — I2510 Atherosclerotic heart disease of native coronary artery without angina pectoris: Secondary | ICD-10-CM | POA: Diagnosis not present

## 2017-01-29 DIAGNOSIS — J209 Acute bronchitis, unspecified: Secondary | ICD-10-CM | POA: Diagnosis not present

## 2017-01-29 DIAGNOSIS — J40 Bronchitis, not specified as acute or chronic: Secondary | ICD-10-CM

## 2017-01-29 DIAGNOSIS — R05 Cough: Secondary | ICD-10-CM | POA: Diagnosis present

## 2017-01-29 DIAGNOSIS — J45909 Unspecified asthma, uncomplicated: Secondary | ICD-10-CM | POA: Insufficient documentation

## 2017-01-29 DIAGNOSIS — I5032 Chronic diastolic (congestive) heart failure: Secondary | ICD-10-CM | POA: Diagnosis not present

## 2017-01-29 DIAGNOSIS — F17210 Nicotine dependence, cigarettes, uncomplicated: Secondary | ICD-10-CM | POA: Insufficient documentation

## 2017-01-29 MED ORDER — DOXYCYCLINE HYCLATE 100 MG PO CAPS: 100.0000 mg | ORAL_CAPSULE | Freq: Two times a day (BID) | ORAL | 0 refills | Status: DC
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
## 2017-01-29 NOTE — Discharge Instructions (Signed)
Follow-up with your family doctor next week if not improving °

## 2017-01-29 NOTE — ED Triage Notes (Signed)
Pt reports cough, congestion x 2-3 days. Denies fever or SHOB.

## 2017-02-04 ENCOUNTER — Other Ambulatory Visit: Payer: Self-pay

## 2017-02-04 ENCOUNTER — Emergency Department (HOSPITAL_COMMUNITY): Payer: Medicare Other

## 2017-02-04 ENCOUNTER — Encounter (HOSPITAL_COMMUNITY): Payer: Self-pay | Admitting: Emergency Medicine

## 2017-02-04 ENCOUNTER — Emergency Department (HOSPITAL_COMMUNITY)
Admission: EM | Admit: 2017-02-04 | Discharge: 2017-02-05 | Disposition: A | Discharge status: Home / Self Care | Payer: Medicare Other | Attending: Emergency Medicine | Admitting: Emergency Medicine

## 2017-02-04 DIAGNOSIS — Z7982 Long term (current) use of aspirin: Secondary | ICD-10-CM | POA: Diagnosis not present

## 2017-02-04 DIAGNOSIS — I5032 Chronic diastolic (congestive) heart failure: Secondary | ICD-10-CM | POA: Insufficient documentation

## 2017-02-04 DIAGNOSIS — I2510 Atherosclerotic heart disease of native coronary artery without angina pectoris: Secondary | ICD-10-CM | POA: Diagnosis not present

## 2017-02-04 DIAGNOSIS — R05 Cough: Secondary | ICD-10-CM | POA: Diagnosis not present

## 2017-02-04 DIAGNOSIS — J45909 Unspecified asthma, uncomplicated: Secondary | ICD-10-CM | POA: Insufficient documentation

## 2017-02-04 DIAGNOSIS — J209 Acute bronchitis, unspecified: Secondary | ICD-10-CM | POA: Diagnosis not present

## 2017-02-04 DIAGNOSIS — Z79899 Other long term (current) drug therapy: Secondary | ICD-10-CM | POA: Diagnosis not present

## 2017-02-04 DIAGNOSIS — I110 Hypertensive heart disease with heart failure: Secondary | ICD-10-CM | POA: Diagnosis not present

## 2017-02-04 DIAGNOSIS — I252 Old myocardial infarction: Secondary | ICD-10-CM | POA: Diagnosis not present

## 2017-02-04 DIAGNOSIS — R0789 Other chest pain: Secondary | ICD-10-CM | POA: Diagnosis present

## 2017-02-04 DIAGNOSIS — Z951 Presence of aortocoronary bypass graft: Secondary | ICD-10-CM | POA: Diagnosis not present

## 2017-02-04 LAB — CBC
HCT: 43.2 % (ref 39.0–52.0)
Hemoglobin: 13.9 g/dL (ref 13.0–17.0)
MCH: 30.9 pg (ref 26.0–34.0)
MCHC: 32.2 g/dL (ref 30.0–36.0)
MCV: 96 fL (ref 78.0–100.0)
PLATELETS: 278 10*3/uL (ref 150–400)
RBC: 4.5 MIL/uL (ref 4.22–5.81)
RDW: 15.4 % (ref 11.5–15.5)
WBC: 8.5 10*3/uL (ref 4.0–10.5)

## 2017-02-04 LAB — DIFFERENTIAL
BASOS PCT: 1 %
Basophils Absolute: 0.1 10*3/uL (ref 0.0–0.1)
EOS PCT: 4 %
Eosinophils Absolute: 0.3 10*3/uL (ref 0.0–0.7)
Lymphocytes Relative: 31 %
Lymphs Abs: 2.6 10*3/uL (ref 0.7–4.0)
MONO ABS: 0.4 10*3/uL (ref 0.1–1.0)
MONOS PCT: 5 %
NEUTROS ABS: 5 10*3/uL (ref 1.7–7.7)
Neutrophils Relative %: 59 %

## 2017-02-04 LAB — BASIC METABOLIC PANEL
Anion gap: 7 (ref 5–15)
BUN: 21 mg/dL — AB (ref 6–20)
CALCIUM: 9.3 mg/dL (ref 8.9–10.3)
CHLORIDE: 106 mmol/L (ref 101–111)
CO2: 26 mmol/L (ref 22–32)
CREATININE: 0.88 mg/dL (ref 0.61–1.24)
GFR calc Af Amer: >60 mL/min (ref >60)
GFR calc non Af Amer: >60 mL/min (ref >60)
GLUCOSE: 102 mg/dL — AB (ref 65–99)
Potassium: 3.9 mmol/L (ref 3.5–5.1)
Sodium: 139 mmol/L (ref 135–145)

## 2017-02-04 LAB — D-DIMER, QUANTITATIVE: D-Dimer, Quant: 2.37 ug/mL-FEU — ABNORMAL HIGH (ref 0.00–0.50)

## 2017-02-04 LAB — BRAIN NATRIURETIC PEPTIDE: B Natriuretic Peptide: 136 pg/mL — ABNORMAL HIGH (ref 0.0–100.0)

## 2017-02-04 LAB — TROPONIN I

## 2017-02-04 MED ORDER — IPRATROPIUM-ALBUTEROL 0.5-2.5 (3) MG/3ML IN SOLN
3.0000 mL | Freq: Once | RESPIRATORY_TRACT | Status: AC
  Administered 2017-02-04: 3 mL via RESPIRATORY_TRACT
  Filled 2017-02-04: qty 3
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
## 2017-02-04 NOTE — ED Notes (Signed)
Lab notified that new lab orders have been put in for pt and blood work is already in lab.

## 2017-02-04 NOTE — ED Triage Notes (Signed)
Pain in between shoulder blades and cough x over a week.  Pt states he was seen at this ED  and [facility] in the past week and was d/c home from ed.

## 2017-02-04 NOTE — ED Notes (Addendum)
RT notified for duo neb ordered on pt.

## 2017-02-05 ENCOUNTER — Emergency Department (HOSPITAL_COMMUNITY): Payer: Medicare Other

## 2017-02-05 DIAGNOSIS — J209 Acute bronchitis, unspecified: Secondary | ICD-10-CM | POA: Diagnosis not present

## 2017-02-05 MED ORDER — PREDNISONE 50 MG PO TABS: 50.0000 mg | ORAL_TABLET | Freq: Every day | ORAL | 0 refills | Status: DC

## 2017-02-05 MED ORDER — METHYLPREDNISOLONE SODIUM SUCC 125 MG IJ SOLR
125.0000 mg | Freq: Once | INTRAMUSCULAR | Status: AC
  Administered 2017-02-05: 125 mg via INTRAVENOUS
  Filled 2017-02-05: qty 2

## 2017-02-05 MED ORDER — IOPAMIDOL (ISOVUE-370) INJECTION 76%
100.0000 mL | Freq: Once | INTRAVENOUS | Status: AC | PRN
  Administered 2017-02-05: 100 mL via INTRAVENOUS

## 2017-02-05 NOTE — ED Notes (Signed)
Pt reports his grandchildren have a been diagnosed with respiratory virus and he has been around them a lot the past several days.

## 2017-02-05 NOTE — Discharge Instructions (Signed)
Use your home nebulizer as needed. Return if symptoms are getting worse.

## 2017-02-05 NOTE — ED Notes (Signed)
Patient transported to CT 

## 2017-02-25 ENCOUNTER — Other Ambulatory Visit: Payer: Self-pay | Admitting: Cardiovascular Disease

## 2017-02-28 ENCOUNTER — Encounter (INDEPENDENT_AMBULATORY_CARE_PROVIDER_SITE_OTHER): Payer: Self-pay

## 2017-02-28 ENCOUNTER — Encounter: Payer: Self-pay | Admitting: Cardiovascular Disease

## 2017-02-28 ENCOUNTER — Ambulatory Visit (INDEPENDENT_AMBULATORY_CARE_PROVIDER_SITE_OTHER): Payer: Medicare Other | Admitting: Cardiovascular Disease

## 2017-02-28 VITALS — BP 98/68 | HR 61 | Ht 68.0 in | Wt 248.0 lb

## 2017-02-28 DIAGNOSIS — I2510 Atherosclerotic heart disease of native coronary artery without angina pectoris: Secondary | ICD-10-CM

## 2017-02-28 DIAGNOSIS — E782 Mixed hyperlipidemia: Secondary | ICD-10-CM

## 2017-02-28 DIAGNOSIS — I5032 Chronic diastolic (congestive) heart failure: Secondary | ICD-10-CM

## 2017-02-28 NOTE — Patient Instructions (Signed)
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
## 2017-03-20 ENCOUNTER — Emergency Department (HOSPITAL_COMMUNITY)
Admission: EM | Admit: 2017-03-20 | Discharge: 2017-03-20 | Disposition: A | Discharge status: Home / Self Care | Payer: Medicare Other | Source: Home / Self Care

## 2017-03-20 ENCOUNTER — Encounter (HOSPITAL_COMMUNITY): Payer: Self-pay | Admitting: *Deleted

## 2017-03-20 ENCOUNTER — Other Ambulatory Visit: Payer: Self-pay

## 2017-03-20 ENCOUNTER — Emergency Department (HOSPITAL_COMMUNITY)
Admission: EM | Admit: 2017-03-20 | Discharge: 2017-03-20 | Disposition: A | Discharge status: Home / Self Care | Payer: Medicare Other | Attending: Emergency Medicine | Admitting: Emergency Medicine

## 2017-03-20 ENCOUNTER — Encounter (HOSPITAL_COMMUNITY): Payer: Self-pay

## 2017-03-20 DIAGNOSIS — J45909 Unspecified asthma, uncomplicated: Secondary | ICD-10-CM | POA: Insufficient documentation

## 2017-03-20 DIAGNOSIS — I5042 Chronic combined systolic (congestive) and diastolic (congestive) heart failure: Secondary | ICD-10-CM | POA: Diagnosis not present

## 2017-03-20 DIAGNOSIS — Z5321 Procedure and treatment not carried out due to patient leaving prior to being seen by health care provider: Secondary | ICD-10-CM | POA: Insufficient documentation

## 2017-03-20 DIAGNOSIS — R040 Epistaxis: Secondary | ICD-10-CM

## 2017-03-20 DIAGNOSIS — I252 Old myocardial infarction: Secondary | ICD-10-CM | POA: Diagnosis not present

## 2017-03-20 DIAGNOSIS — I2510 Atherosclerotic heart disease of native coronary artery without angina pectoris: Secondary | ICD-10-CM | POA: Insufficient documentation

## 2017-03-20 DIAGNOSIS — I110 Hypertensive heart disease with heart failure: Secondary | ICD-10-CM | POA: Diagnosis not present

## 2017-03-20 DIAGNOSIS — Z951 Presence of aortocoronary bypass graft: Secondary | ICD-10-CM | POA: Diagnosis not present

## 2017-03-20 DIAGNOSIS — Z79899 Other long term (current) drug therapy: Secondary | ICD-10-CM | POA: Insufficient documentation

## 2017-03-20 DIAGNOSIS — Z7982 Long term (current) use of aspirin: Secondary | ICD-10-CM | POA: Diagnosis not present

## 2017-03-20 MED ORDER — OXYMETAZOLINE HCL 0.05 % NA SOLN
1.0000 | Freq: Once | NASAL | Status: AC
  Administered 2017-03-20: 1 via NASAL
  Filled 2017-03-20: qty 15
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
## 2017-03-20 NOTE — ED Notes (Signed)
Had pt blow nose out. Nasal sprayed into nostrils. Nose clamp applied to nose. Pt given suction if needed for drainage into throat.

## 2017-03-20 NOTE — ED Notes (Signed)
Pt alert & oriented x4, stable gait. Patient given discharge instructions, paperwork & prescription(s). Patient  instructed to stop at the registration desk to finish any additional paperwork. Patient verbalized understanding. Pt left department w/ no further questions. 

## 2017-03-20 NOTE — ED Triage Notes (Signed)
Pt was seen this morning and rapid rhino placed in right nare. Pt reports he feels like he has something running down his throat. No active bleeding noted

## 2017-03-20 NOTE — ED Notes (Signed)
No drainage noted from packing at this time.

## 2017-03-20 NOTE — ED Triage Notes (Signed)
Pt states he woke tonight & nose was bleeding. Pt is on Brilinta.Marland Kitchen

## 2017-03-20 NOTE — Discharge Instructions (Signed)
Return in three days to have the balloon removed.

## 2017-03-22 ENCOUNTER — Encounter (HOSPITAL_COMMUNITY): Payer: Self-pay

## 2017-03-22 ENCOUNTER — Emergency Department (HOSPITAL_COMMUNITY)
Admission: EM | Admit: 2017-03-22 | Discharge: 2017-03-22 | Disposition: A | Discharge status: Home / Self Care | Payer: Medicare Other | Attending: Emergency Medicine | Admitting: Emergency Medicine

## 2017-03-22 DIAGNOSIS — I252 Old myocardial infarction: Secondary | ICD-10-CM | POA: Insufficient documentation

## 2017-03-22 DIAGNOSIS — I5042 Chronic combined systolic (congestive) and diastolic (congestive) heart failure: Secondary | ICD-10-CM | POA: Insufficient documentation

## 2017-03-22 DIAGNOSIS — Z951 Presence of aortocoronary bypass graft: Secondary | ICD-10-CM | POA: Diagnosis not present

## 2017-03-22 DIAGNOSIS — Z7982 Long term (current) use of aspirin: Secondary | ICD-10-CM | POA: Diagnosis not present

## 2017-03-22 DIAGNOSIS — Z79899 Other long term (current) drug therapy: Secondary | ICD-10-CM | POA: Diagnosis not present

## 2017-03-22 DIAGNOSIS — I2510 Atherosclerotic heart disease of native coronary artery without angina pectoris: Secondary | ICD-10-CM | POA: Diagnosis not present

## 2017-03-22 DIAGNOSIS — Z955 Presence of coronary angioplasty implant and graft: Secondary | ICD-10-CM | POA: Diagnosis not present

## 2017-03-22 DIAGNOSIS — R040 Epistaxis: Secondary | ICD-10-CM | POA: Insufficient documentation

## 2017-03-22 DIAGNOSIS — J45909 Unspecified asthma, uncomplicated: Secondary | ICD-10-CM | POA: Insufficient documentation

## 2017-03-22 DIAGNOSIS — I110 Hypertensive heart disease with heart failure: Secondary | ICD-10-CM | POA: Insufficient documentation

## 2017-03-22 DIAGNOSIS — Z4800 Encounter for change or removal of nonsurgical wound dressing: Secondary | ICD-10-CM | POA: Diagnosis present

## 2017-03-22 DIAGNOSIS — R0981 Nasal congestion: Secondary | ICD-10-CM | POA: Insufficient documentation

## 2017-03-22 LAB — CBC WITH DIFFERENTIAL/PLATELET
Basophils Absolute: 0 10*3/uL (ref 0.0–0.1)
Basophils Relative: 0 %
Eosinophils Absolute: 0.1 10*3/uL (ref 0.0–0.7)
Eosinophils Relative: 0 %
HCT: 43.2 % (ref 39.0–52.0)
HEMOGLOBIN: 13.8 g/dL (ref 13.0–17.0)
LYMPHS ABS: 1.5 10*3/uL (ref 0.7–4.0)
LYMPHS PCT: 13 %
MCH: 30.4 pg (ref 26.0–34.0)
MCHC: 31.9 g/dL (ref 30.0–36.0)
MCV: 95.2 fL (ref 78.0–100.0)
Monocytes Absolute: 0.9 10*3/uL (ref 0.1–1.0)
Monocytes Relative: 8 %
NEUTROS ABS: 9 10*3/uL — AB (ref 1.7–7.7)
NEUTROS PCT: 79 %
Platelets: 247 10*3/uL (ref 150–400)
RBC: 4.54 MIL/uL (ref 4.22–5.81)
RDW: 16.2 % — ABNORMAL HIGH (ref 11.5–15.5)
WBC: 11.4 10*3/uL — AB (ref 4.0–10.5)

## 2017-03-22 LAB — PROTIME-INR
INR: 1.14
Prothrombin Time: 14.5 seconds (ref 11.4–15.2)

## 2017-03-22 MED ORDER — OXYMETAZOLINE HCL 0.05 % NA SOLN
2.0000 | Freq: Once | NASAL | Status: AC
  Administered 2017-03-22: 2 via NASAL
  Filled 2017-03-22: qty 15

## 2017-03-22 NOTE — ED Provider Notes (Signed)
Medical screening examination/treatment/procedure(s) were conducted as a shared visit with non-physician practitioner(s) and myself.  I personally evaluated the patient during the encounter.   EKG Interpretation None       Results for orders placed or performed during the hospital encounter of 03/22/17  Protime-INR  Result Value Ref Range   Prothrombin Time 14.5 11.4 - 15.2 seconds   INR 1.14   CBC with Differential  Result Value Ref Range   WBC 11.4 (H) 4.0 - 10.5 K/uL   RBC 4.54 4.22 - 5.81 MIL/uL   Hemoglobin 13.8 13.0 - 17.0 g/dL   HCT 43.2 39.0 - 52.0 %   MCV 95.2 78.0 - 100.0 fL   MCH 30.4 26.0 - 34.0 pg   MCHC 31.9 30.0 - 36.0 g/dL   RDW 16.2 (H) 11.5 - 15.5 %   Platelets 247 150 - 400 K/uL   Neutrophils Relative % 79 %   Neutro Abs 9.0 (H) 1.7 - 7.7 K/uL   Lymphocytes Relative 13 %   Lymphs Abs 1.5 0.7 - 4.0 K/uL   Monocytes Relative 8 %   Monocytes Absolute 0.9 0.1 - 1.0 K/uL   Eosinophils Relative 0 %   Eosinophils Absolute 0.1 0.0 - 0.7 K/uL   Basophils Relative 0 %   Basophils Absolute 0.0 0.0 - 0.1 K/uL    Patient seen by me along with physician assistant.  Patient presented today to have his Rhino Rocket removed from his nose on the right side.  It was removed and bleeding restarted.  Tried Afrin did not stop it.  Rhino Rocket had to be replaced.  Discussed with ear nose and throat they will see him in the office today or tomorrow.  Patient's bleeding now controlled.  Patient's hemoglobin without any significant change since November.  Platelets normal.      Fredia Sorrow, MD 03/22/17 1152
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
## 2017-03-22 NOTE — ED Notes (Signed)
Nose bleeding mildy. PA aware. Pt advised to hold pressure and instructed him how.

## 2017-03-22 NOTE — Discharge Instructions (Signed)
Call Dr. Noreene Filbert office today to arrange a follow-up appointment for either this afternoon or tomorrow.  Keep the nasal packing in place until you are seen for follow-up.

## 2017-03-22 NOTE — ED Triage Notes (Signed)
Pt in for recheck. Pt has rapid rhino in right nare since Monday and was told to come back in today. No bleeding noted . Pt reports only sinus drainage

## 2017-03-23 ENCOUNTER — Other Ambulatory Visit: Payer: Self-pay

## 2017-03-23 ENCOUNTER — Emergency Department (HOSPITAL_COMMUNITY)
Admission: EM | Admit: 2017-03-23 | Discharge: 2017-03-23 | Disposition: A | Discharge status: Home / Self Care | Payer: Medicare Other | Attending: Emergency Medicine | Admitting: Emergency Medicine

## 2017-03-23 ENCOUNTER — Encounter (HOSPITAL_COMMUNITY): Payer: Self-pay | Admitting: Emergency Medicine

## 2017-03-23 DIAGNOSIS — R040 Epistaxis: Secondary | ICD-10-CM | POA: Insufficient documentation

## 2017-03-23 DIAGNOSIS — Z5321 Procedure and treatment not carried out due to patient leaving prior to being seen by health care provider: Secondary | ICD-10-CM | POA: Insufficient documentation

## 2017-03-23 LAB — CBC
HCT: 41 % (ref 39.0–52.0)
Hemoglobin: 13.3 g/dL (ref 13.0–17.0)
MCH: 30.1 pg (ref 26.0–34.0)
MCHC: 32.4 g/dL (ref 30.0–36.0)
MCV: 92.8 fL (ref 78.0–100.0)
PLATELETS: 281 10*3/uL (ref 150–400)
RBC: 4.42 MIL/uL (ref 4.22–5.81)
RDW: 16.1 % — ABNORMAL HIGH (ref 11.5–15.5)
WBC: 11.6 10*3/uL — AB (ref 4.0–10.5)

## 2017-03-23 MED ORDER — OXYMETAZOLINE HCL 0.05 % NA SOLN: 1.0000 | Freq: Once | NASAL | Status: DC

## 2017-03-23 NOTE — ED Triage Notes (Signed)
Pt here with nose bleeding since Monday. Slow dripping noted at rapid rhino. States he feels weak, tired, more so than normal. Pt awake, alert, appropriate at present.

## 2017-03-23 NOTE — ED Notes (Signed)
Unable to locate pt x1

## 2017-04-06 ENCOUNTER — Emergency Department (HOSPITAL_COMMUNITY)
Admission: EM | Admit: 2017-04-06 | Discharge: 2017-04-14 | Disposition: E | Payer: Medicare Other | Attending: Emergency Medicine | Admitting: Emergency Medicine

## 2017-04-06 DIAGNOSIS — J45909 Unspecified asthma, uncomplicated: Secondary | ICD-10-CM | POA: Diagnosis not present

## 2017-04-06 DIAGNOSIS — Z8673 Personal history of transient ischemic attack (TIA), and cerebral infarction without residual deficits: Secondary | ICD-10-CM | POA: Diagnosis not present

## 2017-04-06 DIAGNOSIS — I469 Cardiac arrest, cause unspecified: Secondary | ICD-10-CM | POA: Diagnosis present

## 2017-04-06 DIAGNOSIS — Z79899 Other long term (current) drug therapy: Secondary | ICD-10-CM | POA: Diagnosis not present

## 2017-04-06 DIAGNOSIS — I10 Essential (primary) hypertension: Secondary | ICD-10-CM | POA: Insufficient documentation

## 2017-04-06 DIAGNOSIS — Z951 Presence of aortocoronary bypass graft: Secondary | ICD-10-CM | POA: Insufficient documentation

## 2017-04-06 DIAGNOSIS — Z7982 Long term (current) use of aspirin: Secondary | ICD-10-CM | POA: Insufficient documentation

## 2017-04-06 DIAGNOSIS — I2510 Atherosclerotic heart disease of native coronary artery without angina pectoris: Secondary | ICD-10-CM | POA: Insufficient documentation

## 2017-04-06 MED ORDER — EPINEPHRINE PF 1 MG/10ML IJ SOSY
PREFILLED_SYRINGE | INTRAMUSCULAR | Status: AC | PRN
  Administered 2017-04-06 (×2): 0.1 mg via INTRAVENOUS

## 2017-04-06 MED ORDER — ATROPINE SULFATE 1 MG/ML IJ SOLN
INTRAMUSCULAR | Status: AC | PRN
  Administered 2017-04-06 (×2): 1 mg via INTRAVENOUS

## 2017-04-14 NOTE — Code Documentation (Signed)
Arrived from EMS with [facility] Airway.

## 2017-04-14 NOTE — Code Documentation (Signed)
Patient time of death occurred at 71.
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
## 2017-04-14 NOTE — Code Documentation (Signed)
Pulse check.  No pulse at carotid.   Continue CPR.

## 2017-04-14 NOTE — Code Documentation (Signed)
Family updated as to patient's status by Dr. Rogene Houston.

## 2017-04-14 NOTE — Code Documentation (Signed)
20 minutes of asystole.

## 2017-04-14 NOTE — ED Notes (Signed)
Dr. Rogene Houston spoke with [place] and reported to RN that pt would be a ME case.   We are still awaiting arrival of pt's wife. Pt's son left to pick up pt's wife after Dr. Rogene Houston notified them of pt's death.

## 2017-04-14 NOTE — Code Documentation (Signed)
Pt arrived by [facility], CPR in progress. Pt had set off a bed bug bomb at home and was in the room for unknown period of time. Pt was found on the front porch, alert upon EMS arrival. Pt went into respiratory distress upon EMS transport and then advanced to CPR prior to ED arrival. Pt has 18g IV in place in left hand by EMS with 3 doses of Epi already administered by EMS. EMS reports CPR has been in progress for 12 minutes upon ED arrival.

## 2017-04-14 NOTE — Code Documentation (Signed)
Pulse check at 1435.  No pulse at carotid.  Continue CPR.
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
## 2017-04-14 DEATH — deceased

## 2018-02-27 IMAGING — CT CT ANGIO CHEST-ABD-PELV FOR DISSECTION W/ AND WO/W CM
2 of 7 series · 14 of 46 positions shown, 16 images · IV contrast (Isovue)
Comparison: None.

CLINICAL DATA: Pain between the shoulders for 1 month. Chest pain
radiating down both arms beginning yesterday. Hypertension.

EXAM:
CT ANGIOGRAPHY CHEST, ABDOMEN AND PELVIS
TECHNIQUE: Multidetector CT imaging through the chest, abdomen and pelvis was
performed using the standard protocol during bolus administration of
intravenous contrast. Multiplanar reconstructed images and MIPs were
obtained and reviewed to evaluate the vascular anatomy.
CONTRAST:  100 mL Isovue 370

[Series 5: dissection axial arterial · axial · arterial · 0.85mm/px · z∈[-699,-87]mm · 11 of 230 slices shown, 13 images]
[im 13/230  soft-tissue]
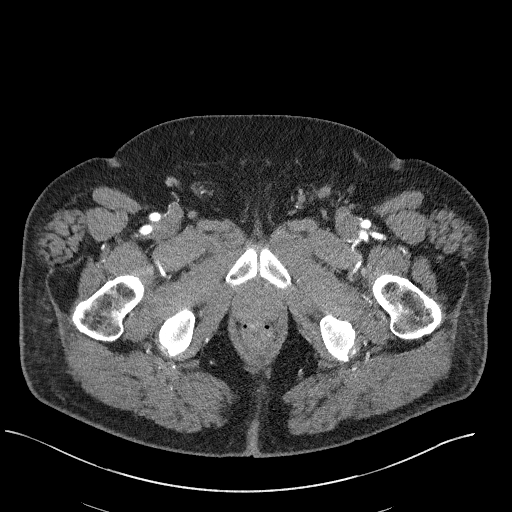
[im 13/230  bone]
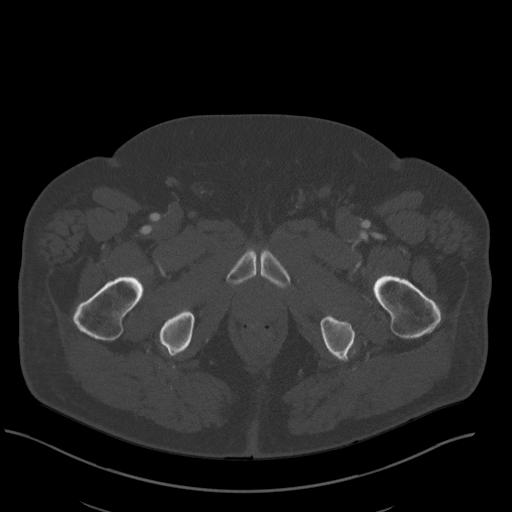
[im 37/230  soft-tissue]
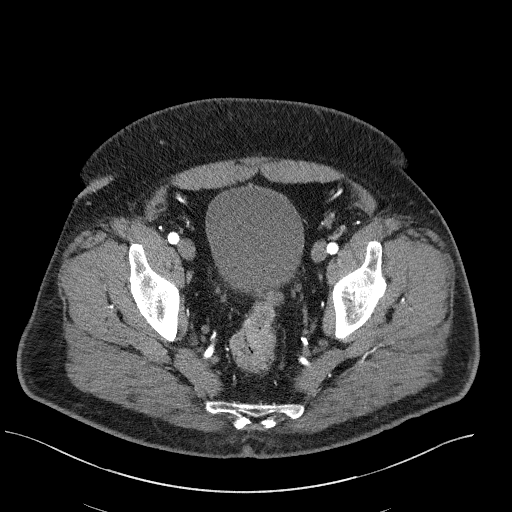
[im 61/230  soft-tissue]
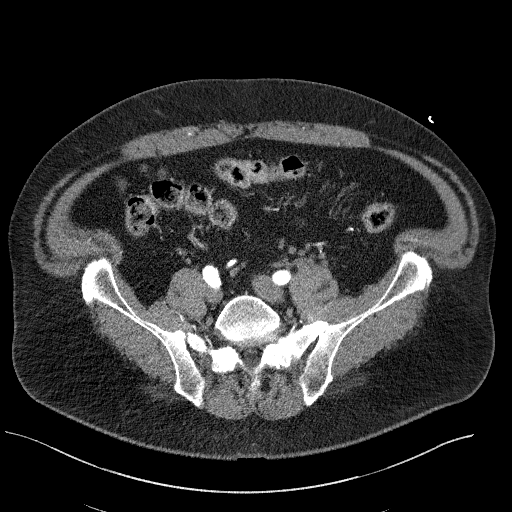
[im 73/230  soft-tissue]
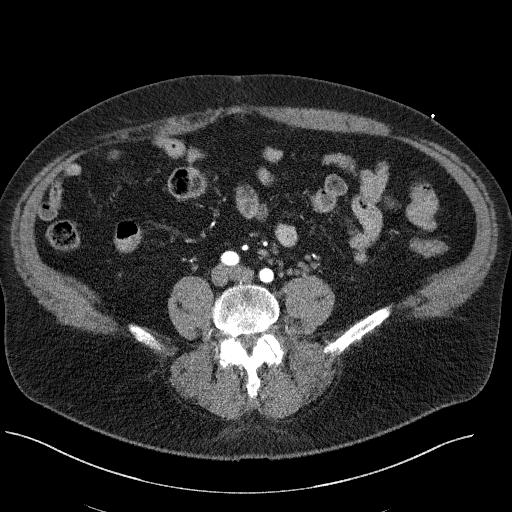
[im 97/230  soft-tissue]
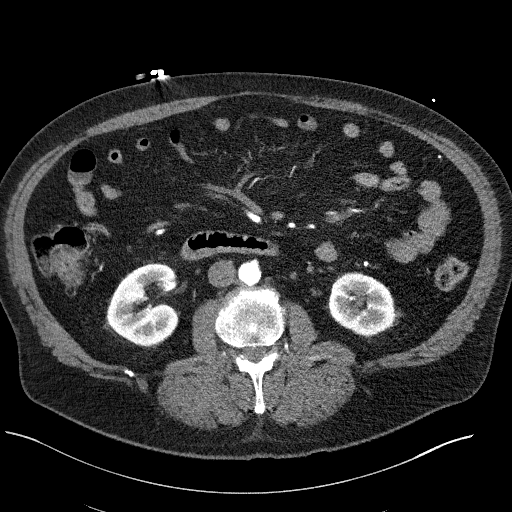
[im 121/230  soft-tissue]
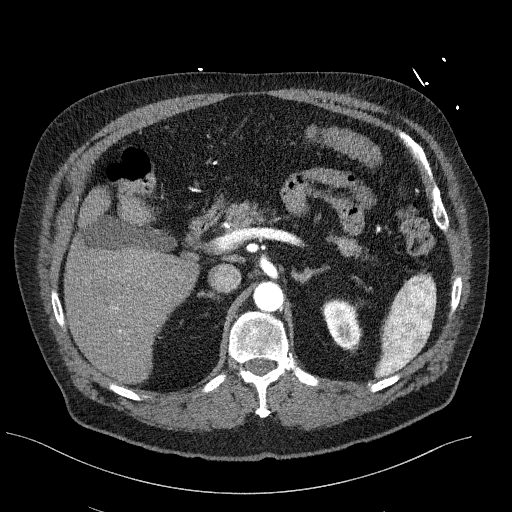
[im 133/230  soft-tissue]
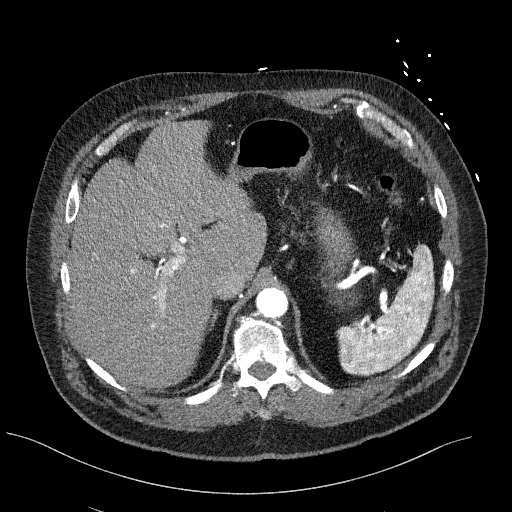
[im 157/230  soft-tissue]
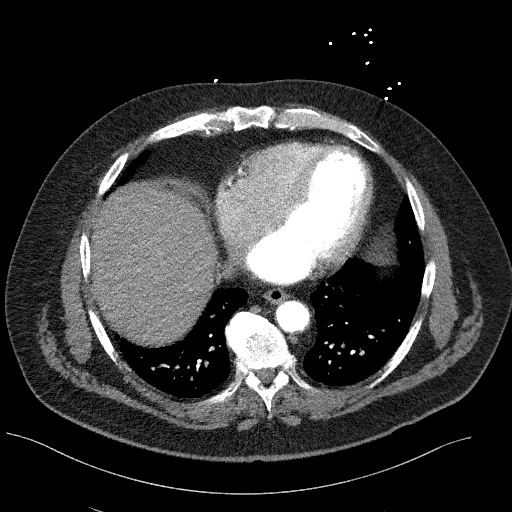
[im 169/230  soft-tissue]
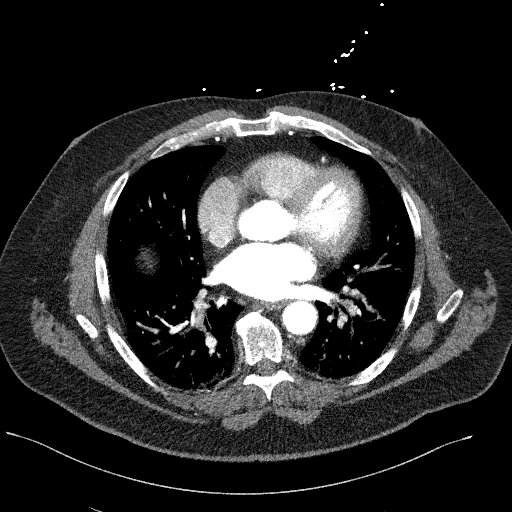
[im 169/230  bone]
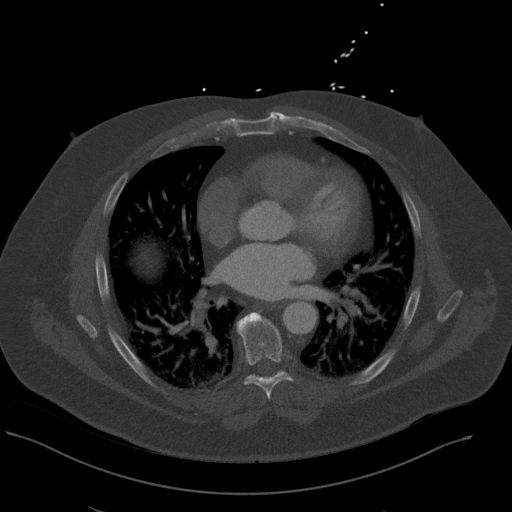
[im 193/230  soft-tissue]
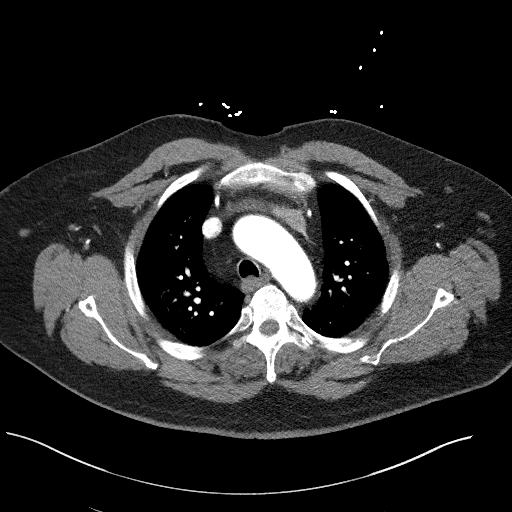
[im 217/230  soft-tissue]
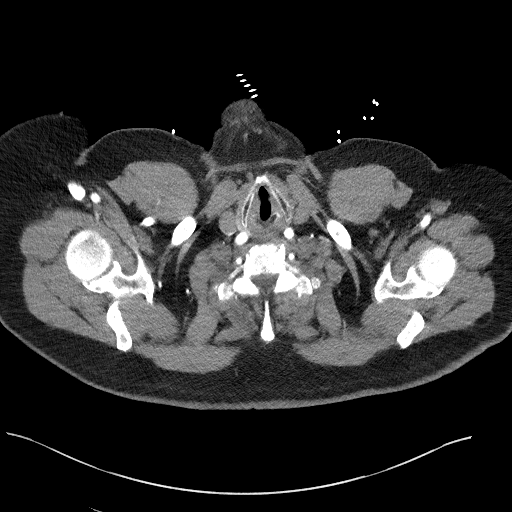

[Series 9: coronals · coronal · 0.92mm/px · 3 of 186 slices shown]
[im 47/186  soft-tissue]
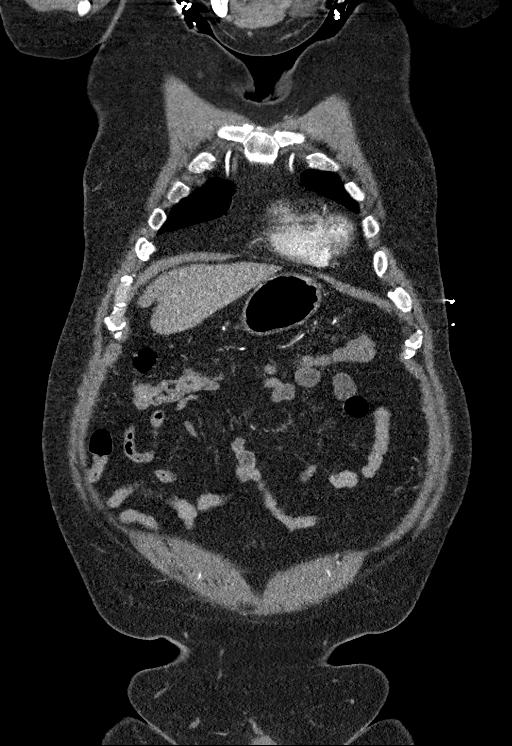
[im 93/186  soft-tissue]
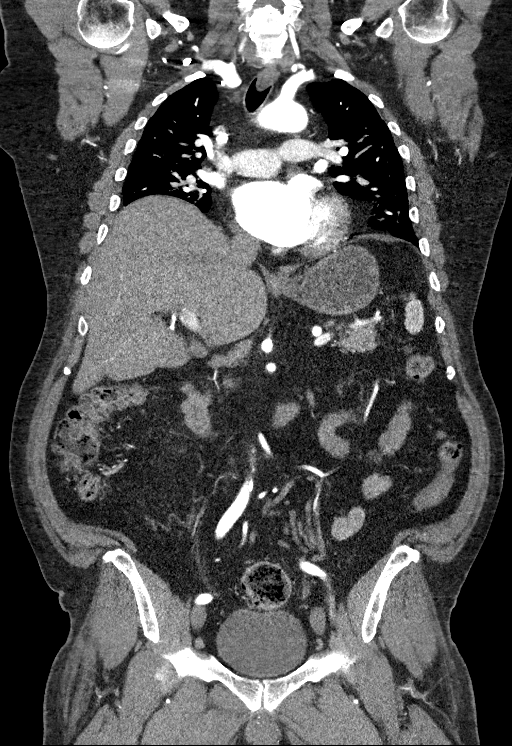
[im 139/186  soft-tissue]
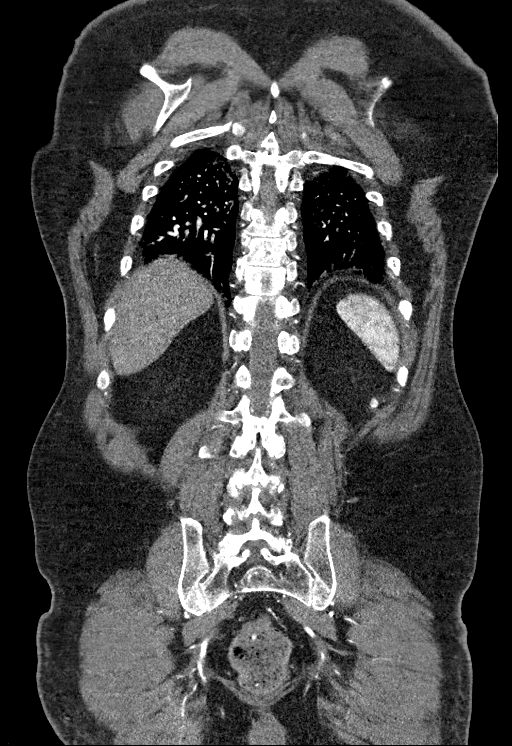

[14 of 46 positions shown; findings below may reference images not displayed]

FINDINGS: CTA CHEST FINDINGS

Cardiovascular: Noncontrast images of the chest demonstrate coronary
artery, aortic valve, and aortic calcifications. No intramural
hematoma.

Images obtained during arterial phase after contrast administration
demonstrate normal caliber thoracic aorta. No aortic dissection.
Great vessel origins are patent. Normal heart size. No pericardial
effusions.

Mediastinum/Nodes: No enlarged mediastinal, hilar, or axillary lymph
nodes. Thyroid gland, trachea, and esophagus demonstrate no
significant findings.

Lungs/Pleura: Motion artifact limits the examination. No focal
consolidation or volume loss. No pleural effusions. No pneumothorax.
Airways are patent.

Musculoskeletal: Degenerative changes in the spine. Normal
alignment. No destructive bone lesions.

Review of the MIP images confirms the above findings.

CTA ABDOMEN AND PELVIS FINDINGS

VASCULAR

Aorta: Normal caliber aorta without aneurysm, dissection, vasculitis
or significant stenosis. Aortic calcifications.

Celiac: Patent without evidence of aneurysm, dissection, vasculitis
or significant stenosis.

SMA: Patent without evidence of aneurysm, dissection, vasculitis or
significant stenosis.

Renals: Duplicated right renal artery. Bilateral renal arteries are
patent without evidence of aneurysm, dissection, vasculitis,
fibromuscular dysplasia or significant stenosis.

IMA: Patent without evidence of aneurysm, dissection, vasculitis or
significant stenosis.

Inflow: Patent without evidence of aneurysm, dissection, vasculitis
or significant stenosis.

Veins: No obvious venous abnormality within the limitations of this
arterial phase study.

Review of the MIP images confirms the above findings.

NON-VASCULAR

Hepatobiliary: Mild diffuse fatty infiltration of the liver. No
focal liver lesions. Gallbladder and bile ducts are unremarkable.

Pancreas: Unremarkable. No pancreatic ductal dilatation or
surrounding inflammatory changes.

Spleen: Normal in size without focal abnormality.

Adrenals/Urinary Tract: Adrenal glands are unremarkable. Kidneys are
normal, without renal calculi, focal lesion, or hydronephrosis.
Bladder is unremarkable.

Stomach/Bowel: Stomach is within normal limits. Appendix appears
normal. No evidence of bowel wall thickening, distention, or
inflammatory changes.

Lymphatic: No significant vascular findings are present. No enlarged
abdominal or pelvic lymph nodes.

Reproductive: Prostate is unremarkable.

Other: No abdominal wall hernia or abnormality. No abdominopelvic
ascites.

Musculoskeletal: Degenerative changes in the spine. No destructive
bone lesions. Schmorl's nodes.

Review of the MIP images confirms the above findings.
IMPRESSION: No evidence of aneurysm or dissection of the thoracic or abdominal
aorta. Aortic atherosclerosis. No acute process demonstrated in the
chest, abdomen, or pelvis.

## 2018-02-27 IMAGING — CR DG CHEST 1V PORT
1 series · 1 of 1 positions shown · non-contrast
Comparison: 05/01/2015

CLINICAL DATA: Upper back pain, chest pain, hypertension.

EXAM:
PORTABLE CHEST 1 VIEW

[portable]
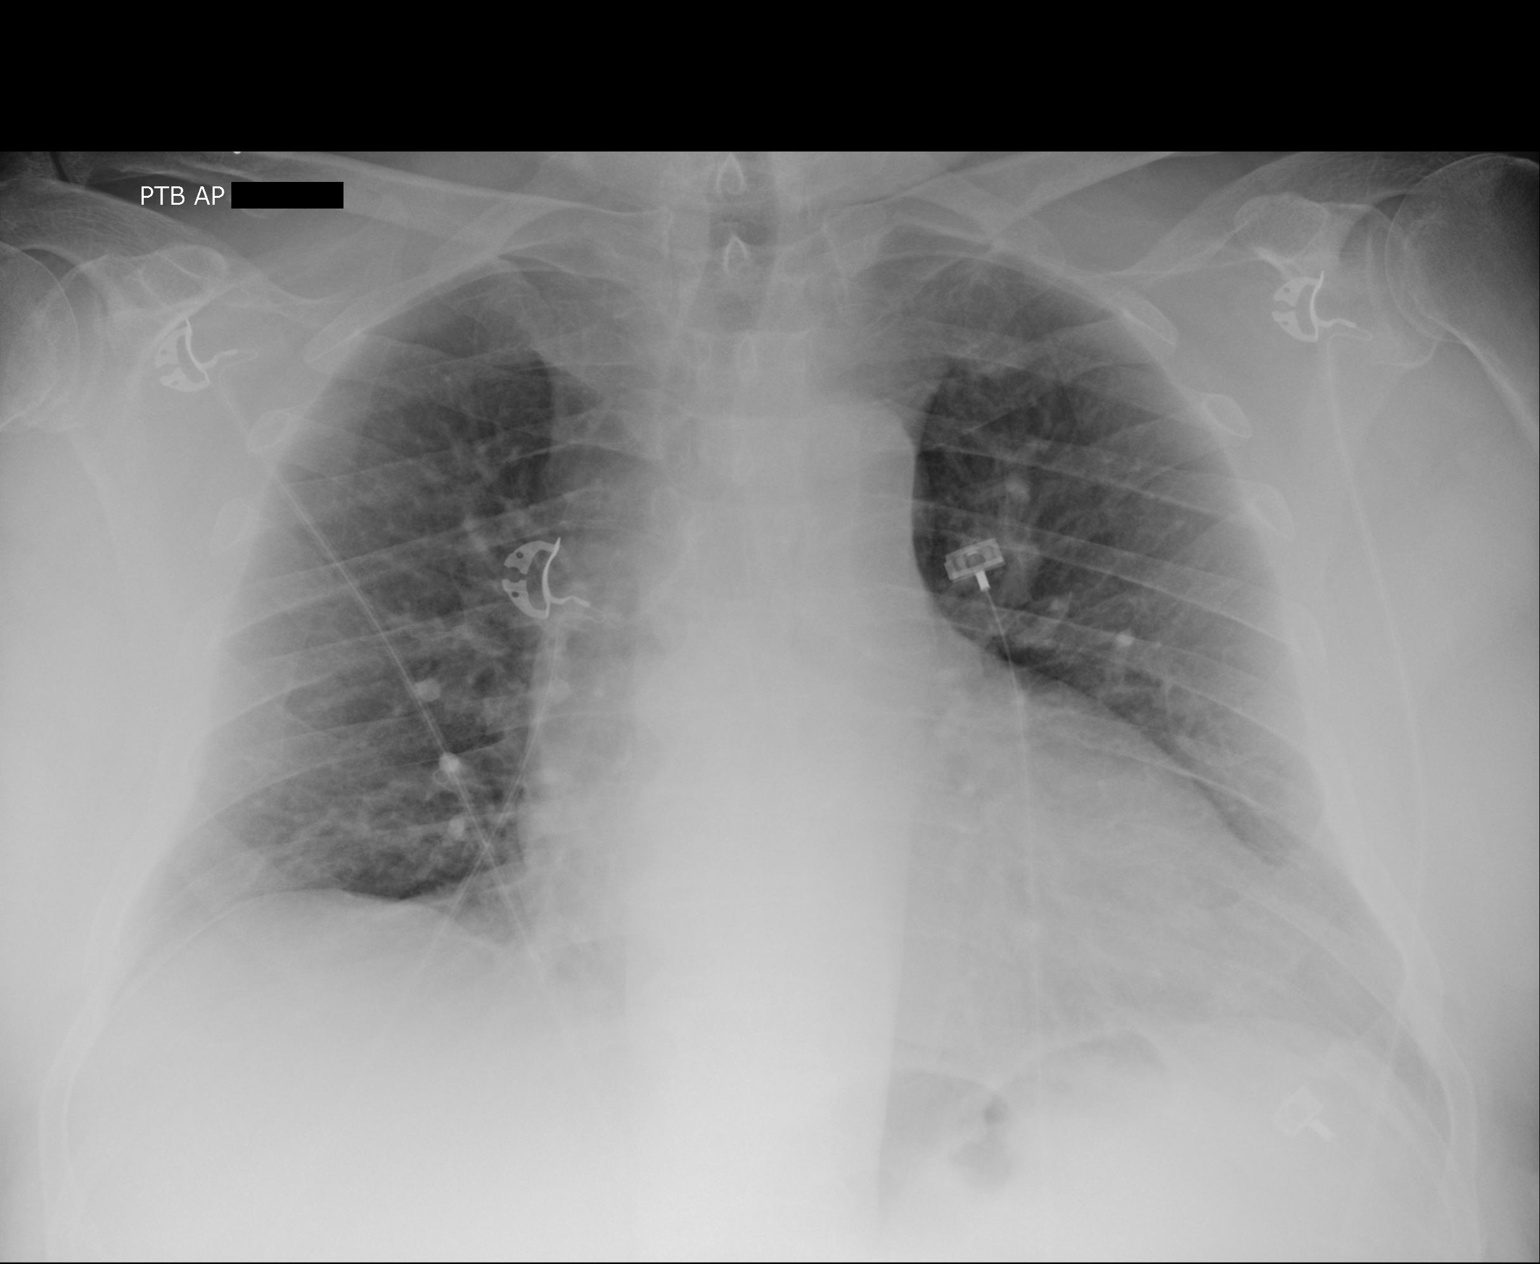

[1 of 1 positions shown; findings below may reference images not displayed]

FINDINGS: Shallow inspiration. Mild cardiac enlargement. No pulmonary vascular
congestion or edema. No focal consolidation. No blunting of
costophrenic angles. No pneumothorax. Mediastinal contours appear
intact.
IMPRESSION: No active disease.

## 2018-03-05 IMAGING — DX DG CHEST 2V
2 series · 2 of 2 positions shown · non-contrast
Comparison: Radiographs and CT 04/23/2016.

CLINICAL DATA: Recent myocardial infarction. Midsternal chest pain
with shortness of breath.

EXAM:
CHEST  2 VIEW

[chest pa]
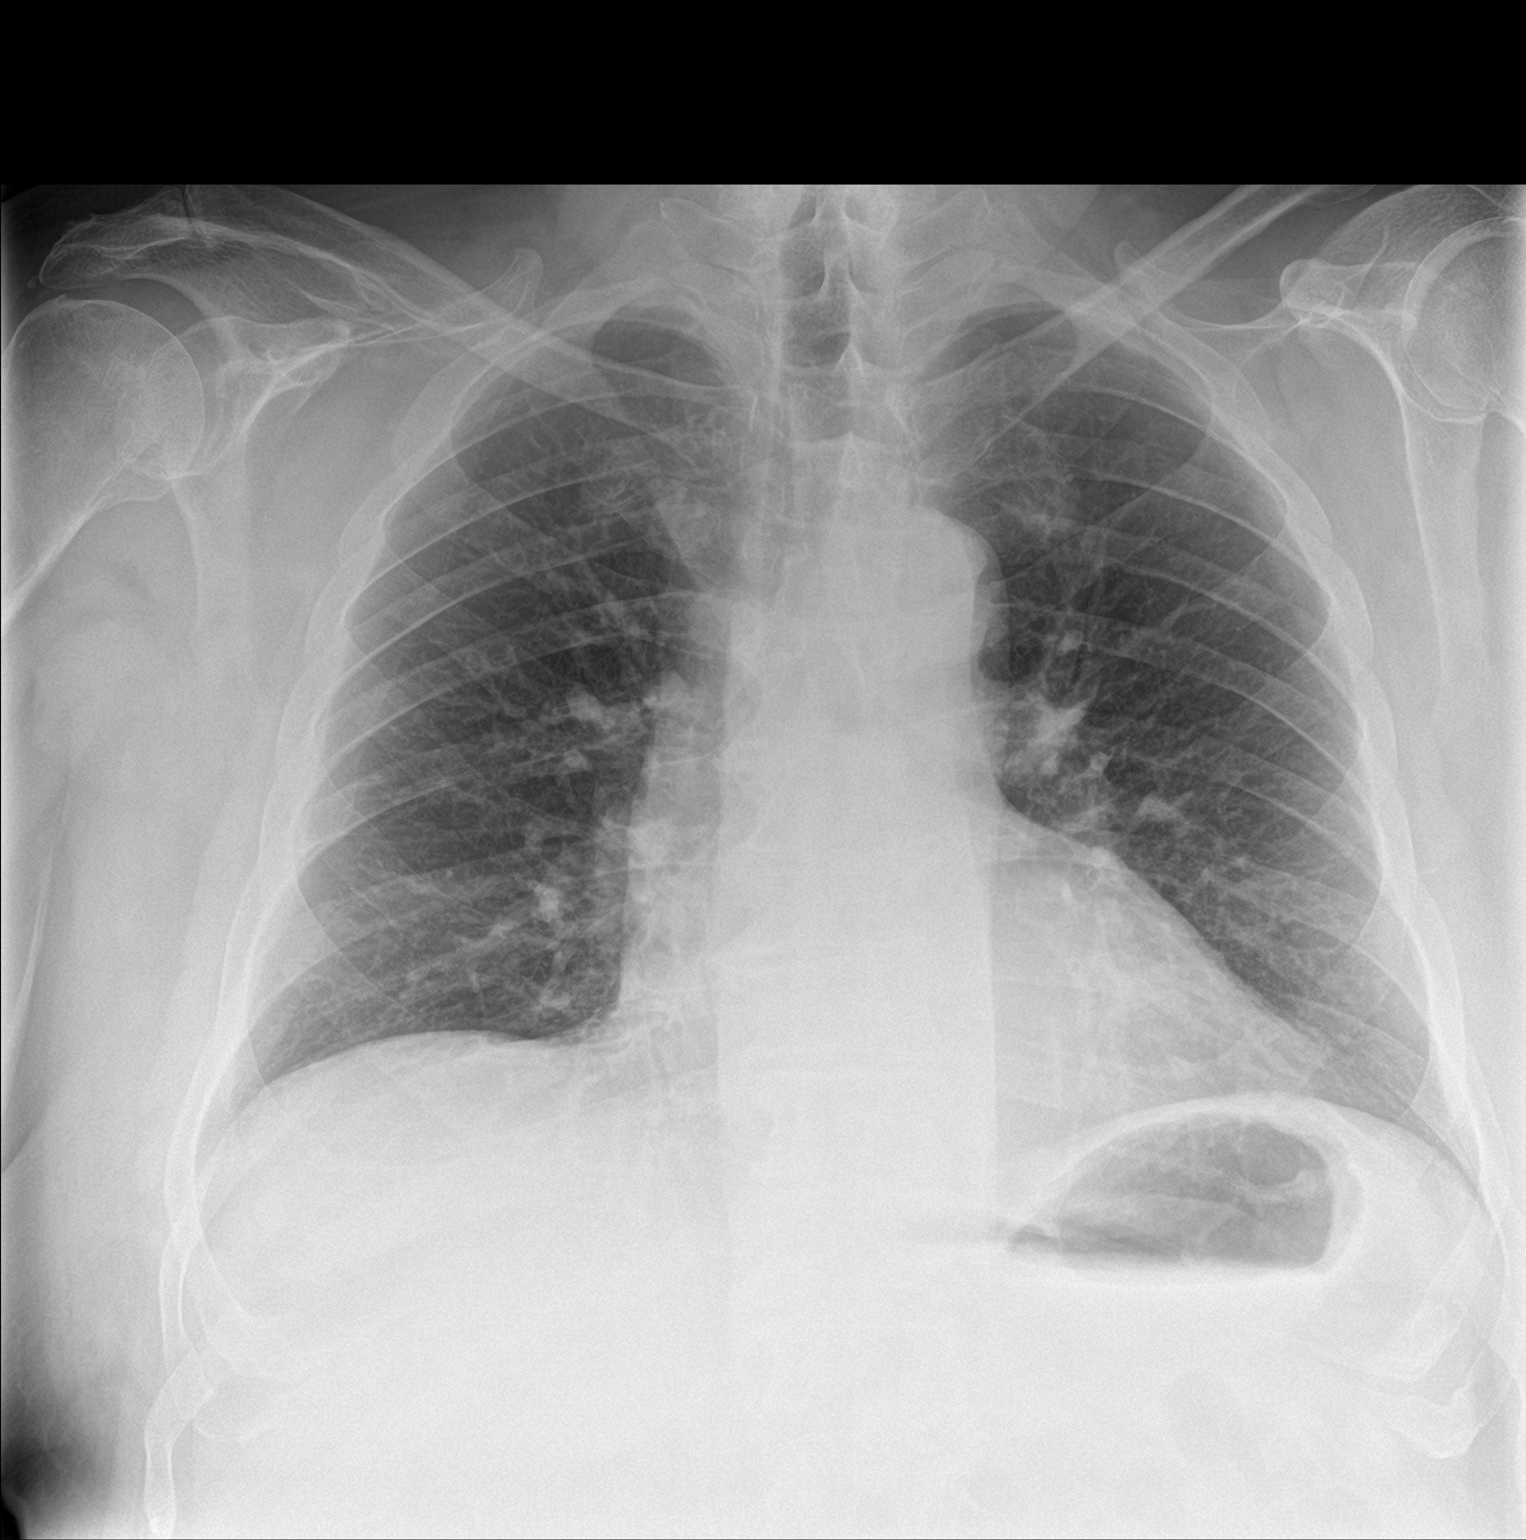

[chest lat]
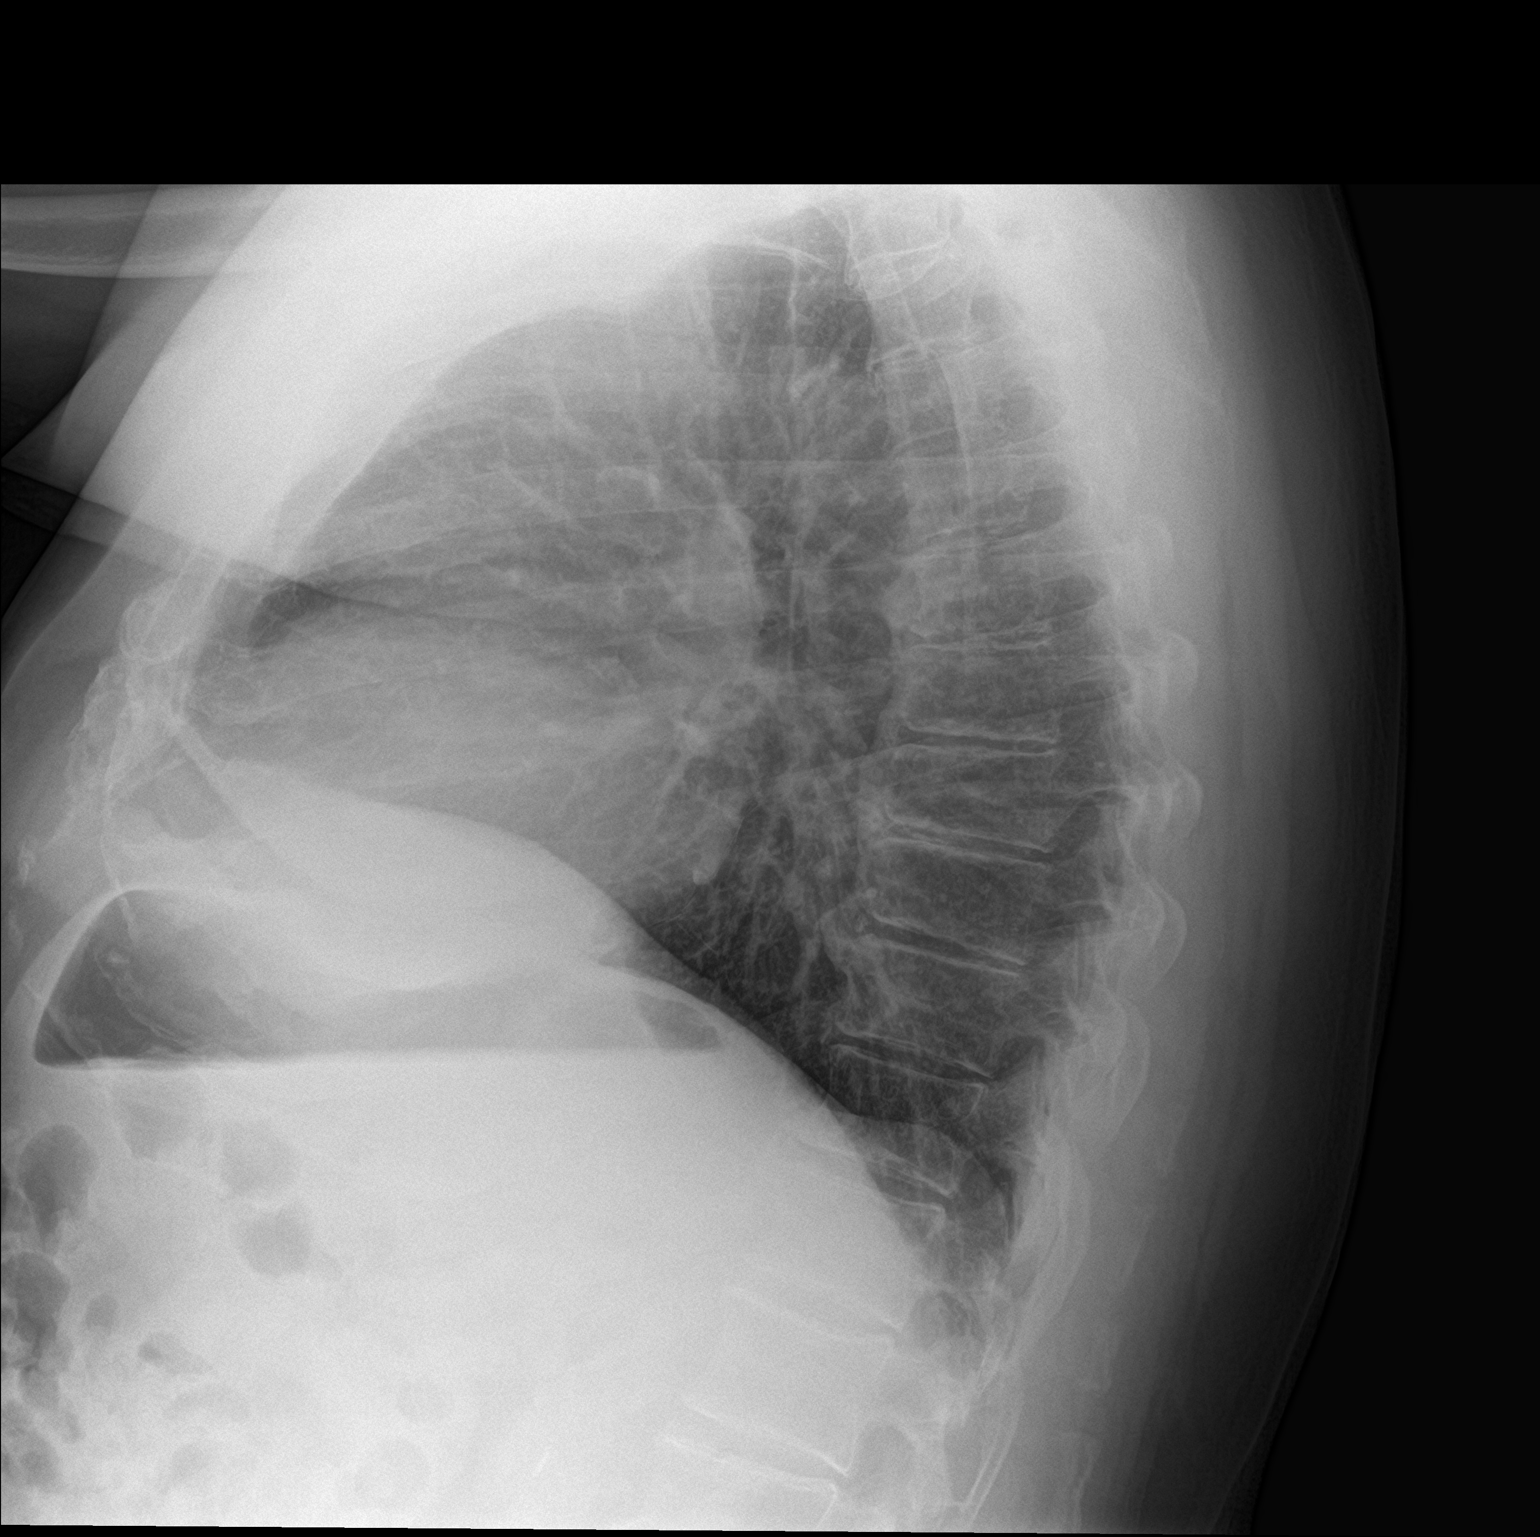

[2 of 2 positions shown; findings below may reference images not displayed]

FINDINGS: The heart size and mediastinal contours are normal. The lungs are
clear. There is no pleural effusion or pneumothorax. No acute
osseous findings are identified. Mild thoracic spine degenerative
changes are noted.
IMPRESSION: Stable chest.  No active cardiopulmonary process.
